# Patient Record
Sex: Male | Born: 1975 | Race: Black or African American | Hispanic: No | Marital: Single | Smoking: Former smoker
Health system: Southern US, Community
[De-identification: ages and names within clinical notes are randomized; demographics above are authoritative.]

## PROBLEM LIST (undated history)

## (undated) DIAGNOSIS — J45909 Unspecified asthma, uncomplicated: Secondary | ICD-10-CM

## (undated) DIAGNOSIS — Z21 Asymptomatic human immunodeficiency virus [HIV] infection status: Secondary | ICD-10-CM

## (undated) DIAGNOSIS — I10 Essential (primary) hypertension: Secondary | ICD-10-CM

## (undated) DIAGNOSIS — J449 Chronic obstructive pulmonary disease, unspecified: Secondary | ICD-10-CM

## (undated) DIAGNOSIS — E119 Type 2 diabetes mellitus without complications: Secondary | ICD-10-CM

## (undated) DIAGNOSIS — R569 Unspecified convulsions: Secondary | ICD-10-CM

---

## 2003-12-06 ENCOUNTER — Other Ambulatory Visit: Payer: Self-pay

## 2004-05-27 ENCOUNTER — Emergency Department: Payer: Self-pay | Admitting: Emergency Medicine

## 2004-06-21 ENCOUNTER — Emergency Department: Payer: Self-pay | Admitting: Unknown Physician Specialty

## 2006-07-01 ENCOUNTER — Other Ambulatory Visit: Payer: Self-pay

## 2006-07-01 ENCOUNTER — Inpatient Hospital Stay: Payer: Self-pay | Admitting: Internal Medicine

## 2006-09-07 ENCOUNTER — Emergency Department: Payer: Self-pay | Admitting: Emergency Medicine

## 2006-11-21 ENCOUNTER — Other Ambulatory Visit: Payer: Self-pay

## 2006-11-21 ENCOUNTER — Inpatient Hospital Stay: Payer: Self-pay | Admitting: *Deleted

## 2007-03-28 ENCOUNTER — Emergency Department: Payer: Self-pay | Admitting: Unknown Physician Specialty

## 2007-07-19 ENCOUNTER — Emergency Department: Payer: Self-pay | Admitting: Emergency Medicine

## 2007-09-06 ENCOUNTER — Other Ambulatory Visit: Payer: Self-pay

## 2007-09-06 ENCOUNTER — Emergency Department: Payer: Self-pay | Admitting: Emergency Medicine

## 2008-04-16 ENCOUNTER — Other Ambulatory Visit: Payer: Self-pay

## 2008-04-16 ENCOUNTER — Emergency Department: Payer: Self-pay | Admitting: Internal Medicine

## 2009-07-12 ENCOUNTER — Emergency Department: Payer: Self-pay | Admitting: Emergency Medicine

## 2015-11-14 ENCOUNTER — Emergency Department
Admission: EM | Admit: 2015-11-14 | Discharge: 2015-11-14 | Disposition: A | Discharge status: Home / Self Care | Payer: Self-pay | Attending: Emergency Medicine | Admitting: Emergency Medicine

## 2015-11-14 ENCOUNTER — Encounter: Payer: Self-pay | Admitting: Emergency Medicine

## 2015-11-14 ENCOUNTER — Emergency Department: Payer: Self-pay

## 2015-11-14 DIAGNOSIS — I10 Essential (primary) hypertension: Secondary | ICD-10-CM | POA: Insufficient documentation

## 2015-11-14 DIAGNOSIS — F17200 Nicotine dependence, unspecified, uncomplicated: Secondary | ICD-10-CM | POA: Insufficient documentation

## 2015-11-14 DIAGNOSIS — E119 Type 2 diabetes mellitus without complications: Secondary | ICD-10-CM | POA: Insufficient documentation

## 2015-11-14 DIAGNOSIS — M5442 Lumbago with sciatica, left side: Secondary | ICD-10-CM | POA: Insufficient documentation

## 2015-11-14 HISTORY — DX: Type 2 diabetes mellitus without complications: E11.9

## 2015-11-14 HISTORY — DX: Essential (primary) hypertension: I10

## 2015-11-14 MED ORDER — KETOROLAC TROMETHAMINE 60 MG/2ML IM SOLN
60.0000 mg | Freq: Once | INTRAMUSCULAR | Status: AC
  Administered 2015-11-14: 60 mg via INTRAMUSCULAR
  Filled 2015-11-14: qty 2

## 2015-11-14 MED ORDER — ETODOLAC 200 MG PO CAPS: 200.0000 mg | ORAL_CAPSULE | Freq: Three times a day (TID) | ORAL | Status: DC

## 2015-11-14 MED ORDER — LIDOCAINE 5 % EX PTCH: 1.0000 | MEDICATED_PATCH | Freq: Two times a day (BID) | CUTANEOUS | Status: DC

## 2015-11-14 MED ORDER — CYCLOBENZAPRINE HCL 10 MG PO TABS: 10.0000 mg | ORAL_TABLET | Freq: Three times a day (TID) | ORAL | Status: DC | PRN

## 2015-11-14 MED ORDER — LIDOCAINE 5 % EX PTCH
1.0000 | MEDICATED_PATCH | CUTANEOUS | Status: DC
  Administered 2015-11-14: 1 via TRANSDERMAL
  Filled 2015-11-14 (×2): qty 1

## 2015-11-14 NOTE — ED Provider Notes (Signed)
[facility] Emergency Department Provider Note  ____________________________________________  Time seen: Approximately 347 AM  I have reviewed the triage vital signs and the nursing notes.   HISTORY  Chief Complaint Back Pain    HPI Aaron [name] is a 40 y.o. male who comes into the hospital today with some back pain. The patient reports he has pain to the left side of his back and is going down his leg. He also reports that the pain as going across his back. The patient reports that the pain started around March 10. He has been taking aspirin but he reports it is not helping fully.The patient reports that he was with his boss and his boss how much pain he was in so he told him that he should come and get checked out. The patient denies having similar pain to this in the past. He denies having a primary care physician. The patient has not had any trauma or any lifting or any bending. The patient reports his pain is 8 out of 10 in intensity. He reports that he also has some pain when he walks. The patient is here for evaluation of his back pain. As a sharp aching and stabbing in his left lower back.   Past Medical History  Diagnosis Date  . Hypertension   . Diabetes mellitus without complication (HCC)     There are no active problems to display for this patient.   History reviewed. No pertinent past surgical history.  Current Outpatient Rx  Name  Route  Sig  Dispense  Refill  . cyclobenzaprine (FLEXERIL) 10 MG tablet   Oral   Take 1 tablet (10 mg total) by mouth every 8 (eight) hours as needed for muscle spasms.   15 tablet   0   . etodolac (LODINE) 200 MG capsule   Oral   Take 1 capsule (200 mg total) by mouth every 8 (eight) hours.   12 capsule   0   . lidocaine (LIDODERM) 5 %   Transdermal   Place 1 patch onto the skin every 12 (twelve) hours. Remove & Discard patch within 12 hours or as directed by MD   10 patch   0      Allergies Levaquin  No family history on file.  Social History Social History  Substance Use Topics  . Smoking status: Current Every Day Smoker  . Smokeless tobacco: None  . Alcohol Use: Yes    Review of Systems Constitutional: No fever/chills Eyes: No visual changes. ENT: No sore throat. Cardiovascular: Denies chest pain. Respiratory: Denies shortness of breath. Gastrointestinal: No abdominal pain.  No nausea, no vomiting.  No diarrhea.  No constipation. Genitourinary: Negative for dysuria. Musculoskeletal:  back pain. Skin: Negative for rash. Neurological: Negative for headaches, focal weakness or numbness.  10-point ROS otherwise negative.  ____________________________________________   PHYSICAL EXAM:  VITAL SIGNS: ED Triage Vitals  Enc Vitals Group     BP 11/14/15 0031 144/88 mmHg     Pulse Rate 11/14/15 0031 86     Resp 11/14/15 0031 20     Temp 11/14/15 0031 97.8 F (36.6 C)     Temp Source 11/14/15 0031 Oral     SpO2 11/14/15 0031 96 %     Weight 11/14/15 0031 280 lb (127.007 kg)     Height 11/14/15 0031 6' (1.829 m)     Head Cir --      Peak Flow --      Pain Score  11/14/15 0031 10     Pain Loc --      Pain Edu? --      Excl. in GC? --     Constitutional: Sleeping but arousable and oriented. Well appearing and in mild distress. Eyes: Conjunctivae are normal. PERRL. EOMI. Head: Atraumatic. Nose: No congestion/rhinnorhea. Mouth/Throat: Mucous membranes are moist.  Oropharynx non-erythematous. Cardiovascular: Normal rate, regular rhythm. Grossly normal heart sounds.  Good peripheral circulation. Respiratory: Normal respiratory effort.  No retractions. Lungs CTAB. Gastrointestinal: Soft and nontender. No distention. Positive bowel sounds Musculoskeletal: No lower extremity tenderness nor edema.  Left-sided back tenderness to palpation, positive straight leg raise Neurologic:  Normal speech and language.  Skin:  Skin is warm, dry and intact.   Psychiatric: Mood and affect are normal.   ____________________________________________   LABS (all labs ordered are listed, but only abnormal results are displayed)  Labs Reviewed - No data to display ____________________________________________  EKG  none ____________________________________________  RADIOLOGY  Lumbar spine x-ray: Mild curvature. ____________________________________________   PROCEDURES  Procedure(s) performed: None  Critical Care performed: No  ____________________________________________   INITIAL IMPRESSION / ASSESSMENT AND PLAN / ED COURSE  Pertinent labs & imaging results that were available during my care of the patient were reviewed by me and considered in my medical decision making (see chart for details).  This is a 40 year old male who comes into the hospital today with some low back pain. He also has some pain that goes down his leg. It appears as though the patient has some sciatica. I didn't x-ray to evaluate for degenerative changes and it was negative. I did give the patient a dose of Toradol as well as a Lidoderm patch. The patient has no other complaints. He was asleep on the stretcher when I walked into the room to evaluate him as well as during the evaluation he falls asleep easily and does not appear to be in any discomfort. ____________________________________________   FINAL CLINICAL IMPRESSION(S) / ED DIAGNOSES  Final diagnoses:  Left-sided low back pain with left-sided sciatica      Rebecka ApleyAllison P Mychelle Kendra, MD 11/14/15 [phone]

## 2015-11-14 NOTE — Discharge Instructions (Signed)

## 2015-11-14 NOTE — ED Notes (Addendum)
Pt ambulatory to triage with no difficulty. Pt reports pain to his left lower back radiating down his left leg. Pt reports started on 10/16/15 and went away and came back several times since. Pt states he only came in because his boss made him come get checked out. Pt denies injury. Pt states took 4 tylenol 500mg  at 8 pm for the pain. Pt laughing and relaxed during triage.

## 2016-02-22 ENCOUNTER — Emergency Department
Admission: EM | Admit: 2016-02-22 | Discharge: 2016-02-22 | Disposition: A | Discharge status: Home / Self Care | Payer: Self-pay | Attending: Emergency Medicine | Admitting: Emergency Medicine

## 2016-02-22 DIAGNOSIS — Z791 Long term (current) use of non-steroidal anti-inflammatories (NSAID): Secondary | ICD-10-CM | POA: Insufficient documentation

## 2016-02-22 DIAGNOSIS — I10 Essential (primary) hypertension: Secondary | ICD-10-CM | POA: Insufficient documentation

## 2016-02-22 DIAGNOSIS — M10072 Idiopathic gout, left ankle and foot: Secondary | ICD-10-CM | POA: Insufficient documentation

## 2016-02-22 DIAGNOSIS — E119 Type 2 diabetes mellitus without complications: Secondary | ICD-10-CM | POA: Insufficient documentation

## 2016-02-22 DIAGNOSIS — E876 Hypokalemia: Secondary | ICD-10-CM | POA: Insufficient documentation

## 2016-02-22 DIAGNOSIS — L301 Dyshidrosis [pompholyx]: Secondary | ICD-10-CM | POA: Insufficient documentation

## 2016-02-22 DIAGNOSIS — F17200 Nicotine dependence, unspecified, uncomplicated: Secondary | ICD-10-CM | POA: Insufficient documentation

## 2016-02-22 DIAGNOSIS — Z79899 Other long term (current) drug therapy: Secondary | ICD-10-CM | POA: Insufficient documentation

## 2016-02-22 LAB — CBC WITH DIFFERENTIAL/PLATELET
BASOS ABS: 0.1 10*3/uL (ref 0–0.1)
BASOS PCT: 1 %
Eosinophils Absolute: 0.1 10*3/uL (ref 0–0.7)
Eosinophils Relative: 1 %
HEMATOCRIT: 45 % (ref 40.0–52.0)
Hemoglobin: 15.5 g/dL (ref 13.0–18.0)
LYMPHS PCT: 19 %
Lymphs Abs: 2 10*3/uL (ref 1.0–3.6)
MCH: 30.2 pg (ref 26.0–34.0)
MCHC: 34.4 g/dL (ref 32.0–36.0)
MCV: 87.8 fL (ref 80.0–100.0)
MONO ABS: 1.4 10*3/uL — AB (ref 0.2–1.0)
Monocytes Relative: 13 %
NEUTROS ABS: 7.1 10*3/uL — AB (ref 1.4–6.5)
Neutrophils Relative %: 66 %
PLATELETS: 239 10*3/uL (ref 150–440)
RBC: 5.13 MIL/uL (ref 4.40–5.90)
RDW: 15.8 % — AB (ref 11.5–14.5)
WBC: 10.6 10*3/uL (ref 3.8–10.6)

## 2016-02-22 LAB — COMPREHENSIVE METABOLIC PANEL
ALK PHOS: 38 U/L (ref 38–126)
ALT: 25 U/L (ref 17–63)
ANION GAP: 6 (ref 5–15)
AST: 21 U/L (ref 15–41)
Albumin: 3.9 g/dL (ref 3.5–5.0)
BILIRUBIN TOTAL: 0.6 mg/dL (ref 0.3–1.2)
BUN: 14 mg/dL (ref 6–20)
CALCIUM: 9.1 mg/dL (ref 8.9–10.3)
CO2: 28 mmol/L (ref 22–32)
Chloride: 104 mmol/L (ref 101–111)
Creatinine, Ser: 1.37 mg/dL — ABNORMAL HIGH (ref 0.61–1.24)
GLUCOSE: 82 mg/dL (ref 65–99)
POTASSIUM: 3.2 mmol/L — AB (ref 3.5–5.1)
Sodium: 138 mmol/L (ref 135–145)
TOTAL PROTEIN: 7.7 g/dL (ref 6.5–8.1)

## 2016-02-22 LAB — SEDIMENTATION RATE: SED RATE: 16 mm/h — AB (ref 0–15)

## 2016-02-22 LAB — URIC ACID: Uric Acid, Serum: 10.3 mg/dL — ABNORMAL HIGH (ref 4.4–7.6)

## 2016-02-22 MED ORDER — HYDROCORTISONE VALERATE 0.2 % EX OINT
TOPICAL_OINTMENT | CUTANEOUS | Status: AC
Start: 2016-02-22 — End: 2017-02-21

## 2016-02-22 MED ORDER — COLCHICINE 0.6 MG PO TABS: 0.6000 mg | ORAL_TABLET | Freq: Two times a day (BID) | ORAL | Status: DC

## 2016-02-22 MED ORDER — NAPROXEN 500 MG PO TABS: 500.0000 mg | ORAL_TABLET | Freq: Two times a day (BID) | ORAL | Status: DC

## 2016-02-22 MED ORDER — POTASSIUM CHLORIDE ER 10 MEQ PO TBCR: 10.0000 meq | EXTENDED_RELEASE_TABLET | Freq: Every day | ORAL | Status: DC

## 2016-02-22 MED ORDER — DEXAMETHASONE SODIUM PHOSPHATE 10 MG/ML IJ SOLN
10.0000 mg | Freq: Once | INTRAMUSCULAR | Status: AC
  Administered 2016-02-22: 10 mg via INTRAMUSCULAR
  Filled 2016-02-22: qty 1

## 2016-02-22 MED ORDER — HYDROXYZINE HCL 50 MG PO TABS
50.0000 mg | ORAL_TABLET | Freq: Once | ORAL | Status: AC
  Administered 2016-02-22: 50 mg via ORAL
  Filled 2016-02-22: qty 1

## 2016-02-22 NOTE — ED Notes (Signed)
See triage note. States he developed blisters to both hands a few days ago

## 2016-02-22 NOTE — ED Provider Notes (Signed)
[facility] Emergency Department Provider Note   ____________________________________________  Time seen: Approximately 10:36 AM  I have reviewed the triage vital signs and the nursing notes.   HISTORY  Chief Complaint Rash    HPI Aaron [name] is a 40 y.o. male patient complaining bilateral hand blisters 3-5 days.. Patient has a history of eczema and states he has intermittent flareups of this episode. Patient is placed calamine lotion to the area with no relief.Patient also complaining of edema and pain to the right foot. Paced the denies any provocative incident for his pain. Patient has been using over-the-counter pain patches with no nose relief. Patient states  borderline diabetic but has not has not prescribed any medications. Patient rates his overall pain as a 10 over 10.  Past Medical History  Diagnosis Date  . Hypertension   . Diabetes mellitus without complication (HCC)     There are no active problems to display for this patient.   History reviewed. No pertinent past surgical history.  Current Outpatient Rx  Name  Route  Sig  Dispense  Refill  . colchicine 0.6 MG tablet   Oral   Take 1 tablet (0.6 mg total) by mouth 2 (two) times daily.   30 tablet   2   . cyclobenzaprine (FLEXERIL) 10 MG tablet   Oral   Take 1 tablet (10 mg total) by mouth every 8 (eight) hours as needed for muscle spasms.   15 tablet   0   . etodolac (LODINE) 200 MG capsule   Oral   Take 1 capsule (200 mg total) by mouth every 8 (eight) hours.   12 capsule   0   . hydrocortisone valerate ointment (WESTCORT) 0.2 %      Apply to affected area daily   45 g   1   . lidocaine (LIDODERM) 5 %   Transdermal   Place 1 patch onto the skin every 12 (twelve) hours. Remove & Discard patch within 12 hours or as directed by MD   10 patch   0   . naproxen (NAPROSYN) 500 MG tablet   Oral   Take 1 tablet (500 mg total) by mouth 2 (two) times daily with a  meal.   20 tablet   0   . potassium chloride (K-DUR) 10 MEQ tablet   Oral   Take 1 tablet (10 mEq total) by mouth daily.   30 tablet   0     Allergies Levaquin  No family history on file.  Social History Social History  Substance Use Topics  . Smoking status: Current Every Day Smoker  . Smokeless tobacco: None  . Alcohol Use: Yes    Review of Systems Constitutional: No fever/chills Eyes: No visual changes. ENT: No sore throat. Cardiovascular: Denies chest pain. Respiratory: Denies shortness of breath. Gastrointestinal: No abdominal pain.  No nausea, no vomiting.  No diarrhea.  No constipation. Genitourinary: Negative for dysuria. Musculoskeletal: Negative for back pain. Skin: Negative for rash. Neurological: Negative for headaches, focal weakness or numbness. {**Psychiatric: Endocrine: Hematological/Lymphatic: Allergic/Immunilogical: Levaquin ____________________________________________   PHYSICAL EXAM:  VITAL SIGNS: ED Triage Vitals  Enc Vitals Group     BP 02/22/16 0958 147/111 mmHg     Pulse Rate 02/22/16 0958 94     Resp 02/22/16 0958 16     Temp 02/22/16 0958 98.1 F (36.7 C)     Temp Source 02/22/16 0958 Oral     SpO2 02/22/16 0958 99 %  Weight 02/22/16 0958 278 lb (126.1 kg)     Height 02/22/16 0958 6' (1.829 m)     Head Cir --      Peak Flow --      Pain Score 02/22/16 1001 10     Pain Loc --      Pain Edu? --      Excl. in GC? --     Constitutional: Alert and oriented. Well appearing and in no acute distress. Eyes: Conjunctivae are normal. PERRL. EOMI. Head: Atraumatic. Nose: No congestion/rhinnorhea. Mouth/Throat: Mucous membranes are moist.  Oropharynx non-erythematous. Neck: No stridor. No cervical spine tenderness to palpation. Hematological/Lymphatic/Immunilogical: No cervical lymphadenopathy. Cardiovascular: Normal rate, regular rhythm. Grossly normal heart sounds.  Good peripheral circulation. Respiratory: Normal  respiratory effort.  No retractions. Lungs CTAB. Gastrointestinal: Soft and nontender. No distention. No abdominal bruits. No CVA tenderness. Musculoskeletal: No lower extremity tenderness nor edema.  No joint effusions. Neurologic:  Normal speech and language. No gross focal neurologic deficits are appreciated. No gait instability. Skin:  Skin is warm, dry and intact. Bilateral hand rash Psychiatric: Mood and affect are normal. Speech and behavior are normal.  ____________________________________________   LABS (all labs ordered are listed, but only abnormal results are displayed)  Labs Reviewed  COMPREHENSIVE METABOLIC PANEL - Abnormal; Notable for the following:    Potassium 3.2 (*)    Creatinine, Ser 1.37 (*)    All other components within normal limits  CBC WITH DIFFERENTIAL/PLATELET - Abnormal; Notable for the following:    RDW 15.8 (*)    Neutro Abs 7.1 (*)    Monocytes Absolute 1.4 (*)    All other components within normal limits  URIC ACID - Abnormal; Notable for the following:    Uric Acid, Serum 10.3 (*)    All other components within normal limits  SEDIMENTATION RATE - Abnormal; Notable for the following:    Sed Rate 16 (*)    All other components within normal limits   ____________________________________________  EKG   ____________________________________________  RADIOLOGY   ____________________________________________   PROCEDURES  Procedure(s) performed: None  Procedures  Critical Care performed: No  ____________________________________________   INITIAL IMPRESSION / ASSESSMENT AND PLAN / ED COURSE  Pertinent labs & imaging results that were available during my care of the patient were reviewed by me and considered in my medical decision making (see chart for details).  Gout right foot. Hypokalemia. Dyshidrotic eczema bilateral hand.. Patient given discharge care instructions. Patient given a prescription for Colchicine, K-Dur, naproxen and  tramadol. Patient advised to [facility]. ____________________________________________   FINAL CLINICAL IMPRESSION(S) / ED DIAGNOSES  Final diagnoses:  Acute idiopathic gout involving toe of left foot  Hypokalemia  Eczema, dyshidrotic      NEW MEDICATIONS STARTED DURING THIS VISIT:  New Prescriptions   COLCHICINE 0.6 MG TABLET    Take 1 tablet (0.6 mg total) by mouth 2 (two) times daily.   HYDROCORTISONE VALERATE OINTMENT (WESTCORT) 0.2 %    Apply to affected area daily   NAPROXEN (NAPROSYN) 500 MG TABLET    Take 1 tablet (500 mg total) by mouth 2 (two) times daily with a meal.   POTASSIUM CHLORIDE (K-DUR) 10 MEQ TABLET    Take 1 tablet (10 mEq total) by mouth daily.     Note:  This document was prepared using Dragon voice recognition software and may include unintentional dictation errors.    Joni Reining, PA-C 02/22/16 1217  Molly Maduro  Cyril LoosenKinner, MD 02/22/16 1325

## 2016-02-22 NOTE — Discharge Instructions (Signed)
Gout Gout is when your joints become red, sore, and swell (inflamed). This is caused by the buildup of uric acid crystals in the joints. Uric acid is a chemical that is normally in the blood. If the level of uric acid gets too high in the blood, these crystals form in your joints and tissues. Over time, these crystals can form into masses near the joints and tissues. These masses can destroy bone and cause the bone to look misshapen (deformed). HOME CARE   Do not take aspirin for pain.  Only take medicine as told by your doctor.  Rest the joint as much as you can. When in bed, keep sheets and blankets off painful areas.  Keep the sore joints raised (elevated).  Put warm or cold packs on painful joints. Use of warm or cold packs depends on which works best for you.  Use crutches if the painful joint is in your leg.  Drink enough fluids to keep your pee (urine) clear or pale yellow. Limit alcohol, sugary drinks, and drinks with fructose in them.  Follow your diet instructions. Pay careful attention to how much protein you eat. Include fruits, vegetables, whole grains, and fat-free or low-fat milk products in your daily diet. Talk to your doctor or dietitian about the use of coffee, vitamin C, and cherries. These may help lower uric acid levels.  Keep a healthy body weight. GET HELP RIGHT AWAY IF:   You have watery poop (diarrhea), throw up (vomit), or have any side effects from medicines.  You do not feel better in 24 hours, or you are getting worse.  Your joint becomes suddenly more tender, and you have chills or a fever. MAKE SURE YOU:   Understand these instructions.  Will watch your condition.  Will get help right away if you are not doing well or get worse.   This information is not intended to replace advice given to you by your health care provider. Make sure you discuss any questions you have with your health care provider.   Document Released: 05/03/2008 Document Revised:  08/15/2014 Document Reviewed: 03/07/2012 Elsevier Interactive Patient Education 2016 Elsevier Inc.  Hand Dermatitis Hand dermatitis is a skin problem. Small, itchy, raised dots or blisters appear on the palms of the hands. Hand dermatitis can last 3 to 4 weeks. HOME CARE  Avoid washing your hands too much.  Avoid all harsh chemicals. Wear gloves when you use products that can bother your skin.  Use medicated cream (1% hydrocortisone cream) at least 2 to 4 times per day.  Only take medicine as told by your doctor.  You may use wet cloths (compresses) or cold packs. GET HELP RIGHT AWAY IF:   The rash is not better after 1 week of treatment.  The area is red, tender, or yellowish-white fluid (pus) comes from the wound.  The rash is spreading. MAKE SURE YOU:   Understand these instructions.  Will watch your condition.  Will get help right away if you are not doing well or get worse.   This information is not intended to replace advice given to you by your health care provider. Make sure you discuss any questions you have with your health care provider.   Document Released: 10/19/2009 Document Revised: 10/17/2011 Document Reviewed: 02/06/2015 Elsevier Interactive Patient Education 2016 [employer].  Hypokalemia Hypokalemia means that the amount of potassium in the blood is lower than normal.Potassium is a chemical, called an electrolyte, that helps regulate the amount of fluid in  the body. It also stimulates muscle contraction and helps nerves function properly.Most of the body's potassium is inside of cells, and only a very small amount is in the blood. Because the amount in the blood is so small, minor changes can be life-threatening. CAUSES  Antibiotics.  Diarrhea or vomiting.  Using laxatives too much, which can cause diarrhea.  Chronic kidney disease.  Water pills (diuretics).  Eating disorders (bulimia).  Low magnesium level.  Sweating a lot. SIGNS AND  SYMPTOMS  Weakness.  Constipation.  Fatigue.  Muscle cramps.  Mental confusion.  Skipped heartbeats or irregular heartbeat (palpitations).  Tingling or numbness. DIAGNOSIS  Your health care provider can diagnose hypokalemia with blood tests. In addition to checking your potassium level, your health care provider may also check other lab tests. TREATMENT Hypokalemia can be treated with potassium supplements taken by mouth or adjustments in your current medicines. If your potassium level is very low, you may need to get potassium through a vein (IV) and be monitored in the hospital. A diet high in potassium is also helpful. Foods high in potassium are:  Nuts, such as peanuts and pistachios.  Seeds, such as sunflower seeds and pumpkin seeds.  Peas, lentils, and lima beans.  Whole grain and bran cereals and breads.  Fresh fruit and vegetables, such as apricots, avocado, bananas, cantaloupe, kiwi, oranges, tomatoes, asparagus, and potatoes.  Orange and tomato juices.  Red meats.  Fruit yogurt. HOME CARE INSTRUCTIONS  Take all medicines as prescribed by your health care provider.  Maintain a healthy diet by including nutritious food, such as fruits, vegetables, nuts, whole grains, and lean meats.  If you are taking a laxative, be sure to follow the directions on the label. SEEK MEDICAL CARE IF:  Your weakness gets worse.  You feel your heart pounding or racing.  You are vomiting or having diarrhea.  You are diabetic and having trouble keeping your blood glucose in the normal range. SEEK IMMEDIATE MEDICAL CARE IF:  You have chest pain, shortness of breath, or dizziness.  You are vomiting or having diarrhea for more than 2 days.  You faint. MAKE SURE YOU:    Understand these instructions.  Will watch your condition.  Will get help right away if you are not doing well or get worse.   This information is not intended to replace advice given to you by your  health care provider. Make sure you discuss any questions you have with your health care provider.   Document Released: 07/25/2005 Document Revised: 08/15/2014 Document Reviewed: 01/25/2013 Elsevier Interactive Patient Education [employer] Elsevier Inc.

## 2016-02-22 NOTE — ED Notes (Signed)
Pt c/o blisters on BL hands for the past week, pt also c/o right foot pain without injury

## 2016-04-20 ENCOUNTER — Emergency Department
Admission: EM | Admit: 2016-04-20 | Discharge: 2016-04-20 | Disposition: A | Discharge status: Home / Self Care | Payer: Self-pay | Attending: Emergency Medicine | Admitting: Emergency Medicine

## 2016-04-20 DIAGNOSIS — X58XXXA Exposure to other specified factors, initial encounter: Secondary | ICD-10-CM | POA: Insufficient documentation

## 2016-04-20 DIAGNOSIS — F17200 Nicotine dependence, unspecified, uncomplicated: Secondary | ICD-10-CM | POA: Insufficient documentation

## 2016-04-20 DIAGNOSIS — T783XXA Angioneurotic edema, initial encounter: Secondary | ICD-10-CM | POA: Diagnosis present

## 2016-04-20 DIAGNOSIS — Z794 Long term (current) use of insulin: Secondary | ICD-10-CM | POA: Insufficient documentation

## 2016-04-20 DIAGNOSIS — I10 Essential (primary) hypertension: Secondary | ICD-10-CM | POA: Insufficient documentation

## 2016-04-20 DIAGNOSIS — Z79899 Other long term (current) drug therapy: Secondary | ICD-10-CM | POA: Insufficient documentation

## 2016-04-20 DIAGNOSIS — L309 Dermatitis, unspecified: Secondary | ICD-10-CM | POA: Insufficient documentation

## 2016-04-20 DIAGNOSIS — E119 Type 2 diabetes mellitus without complications: Secondary | ICD-10-CM | POA: Insufficient documentation

## 2016-04-20 DIAGNOSIS — Z888 Allergy status to other drugs, medicaments and biological substances status: Secondary | ICD-10-CM | POA: Insufficient documentation

## 2016-04-20 LAB — CBC WITH DIFFERENTIAL/PLATELET
BASOS ABS: 0.1 10*3/uL (ref 0–0.1)
Basophils Relative: 1 %
Eosinophils Absolute: 0 10*3/uL (ref 0–0.7)
Eosinophils Relative: 0 %
HEMATOCRIT: 48.6 % (ref 40.0–52.0)
HEMOGLOBIN: 16.5 g/dL (ref 13.0–18.0)
LYMPHS PCT: 10 %
Lymphs Abs: 1 10*3/uL (ref 1.0–3.6)
MCH: 30 pg (ref 26.0–34.0)
MCHC: 34 g/dL (ref 32.0–36.0)
MCV: 88.2 fL (ref 80.0–100.0)
MONO ABS: 0.4 10*3/uL (ref 0.2–1.0)
MONOS PCT: 4 %
NEUTROS ABS: 8 10*3/uL — AB (ref 1.4–6.5)
NEUTROS PCT: 85 %
Platelets: 236 10*3/uL (ref 150–440)
RBC: 5.51 MIL/uL (ref 4.40–5.90)
RDW: 15 % — AB (ref 11.5–14.5)
WBC: 9.5 10*3/uL (ref 3.8–10.6)

## 2016-04-20 LAB — BASIC METABOLIC PANEL
ANION GAP: 9 (ref 5–15)
BUN: 11 mg/dL (ref 6–20)
CALCIUM: 9.4 mg/dL (ref 8.9–10.3)
CHLORIDE: 103 mmol/L (ref 101–111)
CO2: 25 mmol/L (ref 22–32)
Creatinine, Ser: 1.36 mg/dL — ABNORMAL HIGH (ref 0.61–1.24)
GFR calc Af Amer: >60 mL/min (ref >60)
GFR calc non Af Amer: >60 mL/min (ref >60)
GLUCOSE: 106 mg/dL — AB (ref 65–99)
Potassium: 3.8 mmol/L (ref 3.5–5.1)
Sodium: 137 mmol/L (ref 135–145)

## 2016-04-20 MED ORDER — DIPHENHYDRAMINE HCL 25 MG PO CAPS: 25.0000 mg | ORAL_CAPSULE | Freq: Four times a day (QID) | ORAL | Status: DC | PRN

## 2016-04-20 MED ORDER — AMLODIPINE BESYLATE 5 MG PO TABS: 10.0000 mg | ORAL_TABLET | Freq: Every day | ORAL | Status: DC

## 2016-04-20 MED ORDER — EPINEPHRINE 0.3 MG/0.3ML IJ SOAJ: 0.3000 mg | Freq: Once | INTRAMUSCULAR | 0 refills | Status: AC

## 2016-04-20 MED ORDER — ONDANSETRON HCL 4 MG PO TABS: 4.0000 mg | ORAL_TABLET | Freq: Four times a day (QID) | ORAL | Status: DC | PRN

## 2016-04-20 MED ORDER — ALBUTEROL SULFATE (2.5 MG/3ML) 0.083% IN NEBU: 2.5000 mg | INHALATION_SOLUTION | RESPIRATORY_TRACT | Status: DC | PRN

## 2016-04-20 MED ORDER — PREDNISONE 20 MG PO TABS
60.0000 mg | ORAL_TABLET | Freq: Once | ORAL | Status: AC
  Administered 2016-04-20: 60 mg via ORAL

## 2016-04-20 MED ORDER — ENOXAPARIN SODIUM 40 MG/0.4ML ~~LOC~~ SOLN: 40.0000 mg | SUBCUTANEOUS | Status: DC

## 2016-04-20 MED ORDER — FAMOTIDINE 20 MG PO TABS: 20.0000 mg | ORAL_TABLET | Freq: Two times a day (BID) | ORAL | Status: DC

## 2016-04-20 MED ORDER — DIPHENHYDRAMINE HCL 25 MG PO CAPS
ORAL_CAPSULE | ORAL | Status: AC
  Administered 2016-04-20: 25 mg via ORAL
  Filled 2016-04-20: qty 1

## 2016-04-20 MED ORDER — SODIUM CHLORIDE 0.9% FLUSH: 3.0000 mL | INTRAVENOUS | Status: DC | PRN

## 2016-04-20 MED ORDER — POLYETHYLENE GLYCOL 3350 17 G PO PACK: 17.0000 g | PACK | Freq: Every day | ORAL | Status: DC | PRN

## 2016-04-20 MED ORDER — SODIUM CHLORIDE 0.9 % IV SOLN: 250.0000 mL | INTRAVENOUS | Status: DC | PRN

## 2016-04-20 MED ORDER — ACETAMINOPHEN 650 MG RE SUPP
650.0000 mg | Freq: Four times a day (QID) | RECTAL | Status: DC | PRN
  Filled 2016-04-20: qty 1

## 2016-04-20 MED ORDER — DIPHENHYDRAMINE HCL 25 MG PO CAPS
25.0000 mg | ORAL_CAPSULE | Freq: Once | ORAL | Status: AC
  Administered 2016-04-20: 25 mg via ORAL

## 2016-04-20 MED ORDER — HYDRALAZINE HCL 20 MG/ML IJ SOLN: 10.0000 mg | Freq: Four times a day (QID) | INTRAMUSCULAR | Status: DC | PRN

## 2016-04-20 MED ORDER — INSULIN ASPART 100 UNIT/ML ~~LOC~~ SOLN: 0.0000 [IU] | Freq: Three times a day (TID) | SUBCUTANEOUS | Status: DC

## 2016-04-20 MED ORDER — ONDANSETRON HCL 4 MG/2ML IJ SOLN: 4.0000 mg | Freq: Four times a day (QID) | INTRAMUSCULAR | Status: DC | PRN

## 2016-04-20 MED ORDER — SODIUM CHLORIDE 0.9% FLUSH: 3.0000 mL | Freq: Two times a day (BID) | INTRAVENOUS | Status: DC

## 2016-04-20 MED ORDER — PREDNISONE 20 MG PO TABS: 60.0000 mg | ORAL_TABLET | Freq: Every day | ORAL | 0 refills | Status: DC

## 2016-04-20 MED ORDER — ACETAMINOPHEN 325 MG PO TABS: 650.0000 mg | ORAL_TABLET | Freq: Four times a day (QID) | ORAL | Status: DC | PRN

## 2016-04-20 MED ORDER — PREDNISONE 20 MG PO TABS
ORAL_TABLET | ORAL | Status: AC
  Administered 2016-04-20: 60 mg via ORAL
  Filled 2016-04-20: qty 3

## 2016-04-20 MED ORDER — PREDNISONE 20 MG PO TABS: 50.0000 mg | ORAL_TABLET | Freq: Every day | ORAL | Status: DC

## 2016-04-20 NOTE — ED Notes (Signed)
Angioedema to upper lip, pt states it began yesterday, denies any new medication, states he has been taking benadryl at home, pt speaking in full clear sentances

## 2016-04-20 NOTE — ED Provider Notes (Addendum)
[facility] Emergency Department Provider Note  ____________________________________________   First MD Initiated Contact with Patient 04/20/16 1405     (approximate)  I have reviewed the triage vital signs and the nursing notes.   HISTORY  Chief Complaint Oral Swelling   HPI Aaron [name] is a 40 y.o. male with a history of diabetes and hypertension denies taking any medications at this time was present emergency Department with 1 day of worsening upper lip swelling. He says that he is now having a scratchy feeling in the back of his throat. He says that his voice is normal and he is controlling his secretions. However, he does feel swelling to the tissue under the back of his jaw. He says that it also feels "itchy" when he swallows. He denies taking any medications at this time. Denies any foods, detergents or soaps. Says that he had 3 tabs of prednisone as well as Benadryl in the lobby and his symptoms seem to be worsening.    Past Medical History:  Diagnosis Date  . Diabetes mellitus without complication (HCC)   . Hypertension     There are no active problems to display for this patient.   History reviewed. No pertinent surgical history.  Prior to Admission medications   Medication Sig Start Date End Date Taking? Authorizing Provider  colchicine 0.6 MG tablet Take 1 tablet (0.6 mg total) by mouth 2 (two) times daily. 02/22/16 02/21/17  Joni Reiningonald K Smith, PA-C  cyclobenzaprine (FLEXERIL) 10 MG tablet Take 1 tablet (10 mg total) by mouth every 8 (eight) hours as needed for muscle spasms. 11/14/15   Rebecka ApleyAllison P Webster, MD  etodolac (LODINE) 200 MG capsule Take 1 capsule (200 mg total) by mouth every 8 (eight) hours. 11/14/15   Rebecka ApleyAllison P Webster, MD  hydrocortisone valerate ointment (WESTCORT) 0.2 % Apply to affected area daily 02/22/16 02/21/17  Joni Reiningonald K Smith, PA-C  lidocaine (LIDODERM) 5 % Place 1 patch onto the skin every 12 (twelve) hours. Remove & Discard  patch within 12 hours or as directed by MD 11/14/15 11/13/16  Rebecka ApleyAllison P Webster, MD  naproxen (NAPROSYN) 500 MG tablet Take 1 tablet (500 mg total) by mouth 2 (two) times daily with a meal. 02/22/16   Joni Reiningonald K Smith, PA-C  potassium chloride (K-DUR) 10 MEQ tablet Take 1 tablet (10 mEq total) by mouth daily. 02/22/16   Joni Reiningonald K Smith, PA-C    Allergies Levaquin [levofloxacin]  No family history on file.  Social History Social History  Substance Use Topics  . Smoking status: Current Every Day Smoker  . Smokeless tobacco: Never Used  . Alcohol use Yes    Review of Systems onstitutional: No fever/chills Eyes: No visual changes. ENT: No sore throat. Cardiovascular: Denies chest pain. Respiratory: Denies shortness of breath. Gastrointestinal: No abdominal pain.  No nausea, no vomiting.   Genitourinary: Negative for dysuria. Musculoskeletal: Negative for back pain. Skin: Negative for rash. Neurological: Negative for headaches, focal weakness or numbness.  10-point ROS otherwise negative.  ____________________________________________   PHYSICAL EXAM:  VITAL SIGNS: ED Triage Vitals  Enc Vitals Group     BP 04/20/16 1157 (!) 168/117     Pulse Rate 04/20/16 1157 90     Resp 04/20/16 1157 18     Temp 04/20/16 1157 98.5 F (36.9 C)     Temp Source 04/20/16 1157 Oral     SpO2 04/20/16 1157 98 %     Weight 04/20/16 1159 215 lb (97.5 kg)  Height 04/20/16 1159 6' (1.829 m)     Head Circumference --      Peak Flow --      Pain Score 04/20/16 1159 7     Pain Loc --      Pain Edu? --      Excl. in GC? --     Constitutional: Alert and oriented. Well appearing and in no acute distress. Eyes: Conjunctivae are normal. PERRL. EOMI. Head: Atraumatic. Nose: No congestion/rhinnorhea. Mouth/Throat: Mucous membranes are moist.  Oropharynx non-erythematous.Mildly swollen uvula. Upper lip with mild to moderate edema which is tender to palpation. No pus. No induration. Controlling his  secretions. Speaks in a normal voice. Mild fullness with tenderness to palpation at the posterior, submandibular tissues. No erythema, induration or pus. Neck: No stridor.   Cardiovascular: Normal rate, regular rhythm. Grossly normal heart sounds.   Respiratory: Normal respiratory effort.  No retractions. Lungs CTAB. Gastrointestinal: Soft and nontender. No distention.  Musculoskeletal: No lower extremity tenderness nor edema.  No joint effusions. Neurologic:  Normal speech and language. No gross focal neurologic deficits are appreciated. No gait instability. Skin:  Skin is warm, dry and intact. No rash noted. Psychiatric: Mood and affect are normal. Speech and behavior are normal.  ____________________________________________   LABS (all labs ordered are listed, but only abnormal results are displayed)  Labs Reviewed  CBC WITH DIFFERENTIAL/PLATELET - Abnormal; Notable for the following:       Result Value   RDW 15.0 (*)    Neutro Abs 8.0 (*)    All other components within normal limits  BASIC METABOLIC PANEL   ____________________________________________  EKG   ____________________________________________  RADIOLOGY   ____________________________________________   PROCEDURES  Procedure(s) performed:   Procedures  Critical Care performed:   ____________________________________________   INITIAL IMPRESSION / ASSESSMENT AND PLAN / ED COURSE  Pertinent labs & imaging results that were available during my care of the patient were reviewed by me and considered in my medical decision making (see chart for details).  ----------------------------------------- 3:56 PM on 04/20/2016 -----------------------------------------  Patient still without distress. Discussed case Dr. Willeen Cass of the ear nose and throat. He'll be endoscoped the patient. Because the patient has worsening symptoms despite steroids will give him for airway observation. He is understanding the plan and  willing to comply. Patient also denies any NSAID use, including aspirin or Goody's powder.  Clinical Course     ____________________________________________   FINAL CLINICAL IMPRESSION(S) / ED DIAGNOSES  Angioedema    NEW MEDICATIONS STARTED DURING THIS VISIT:  New Prescriptions   No medications on file     Note:  This document was prepared using Dragon voice recognition software and may include unintentional dictation errors.    Myrna Blazer, MD 04/20/16 1558  Patient says he is now feeling better and his throat feels normal. Dr. Willeen Cass of ear nose and throat was at the bedside but decided not to scope him because of his clinical improvement and no uvular swelling at this time. I'll be discharging the patient home. He will follow-up in the office with the ear nose and throat doctor. I'll be discharging him with steroids as well as an EpiPen. Unclear cause of the patient's facial swelling. However, he is improving with steroids and has undergone a significant period of observation in the emergency department. He understands the plan to return to the emergency department immediately for any worsening or concerning symptoms. He'll be discharged home.    Myrna Blazer, MD 04/20/16 [phone]

## 2016-04-20 NOTE — H&P (Deleted)
[facility] [place] at [facility]   PATIENT NAME: Aaron SartoriusMatthew [name]    MR#:  [id number]  DATE OF BIRTH:  23-Apr-1976  DATE OF ADMISSION:  04/20/2016  PRIMARY CARE PHYSICIAN: No PCP Per Patient   REQUESTING/REFERRING PHYSICIAN: Dr. Raynelle CharySchavitz  CHIEF COMPLAINT:   Chief Complaint  Patient presents with  . Oral Swelling    HISTORY OF PRESENT ILLNESS:  Aaron [name]  is a 40 y.o. male with a known history of Hypertension, diabetes, eczema not on medications presents to the hospital complaining of upper lip swelling since yesterday. Patient was hoping this would get better but today due to worsening and they're noticing some change in his eyes and scratchiness in her throat presented to the emergency room. He has received prednisone and with no improvement ENT has been consulted. Patient is being brought in for observation overnight. He has no shortness of breath. Able to clear his saliva well. Afebrile.  PAST MEDICAL HISTORY:   Past Medical History:  Diagnosis Date  . Diabetes mellitus without complication (HCC)   . Hypertension     PAST SURGICAL HISTORY:  History reviewed. No pertinent surgical history.  SOCIAL HISTORY:   Social History  Substance Use Topics  . Smoking status: Current Every Day Smoker  . Smokeless tobacco: Never Used  . Alcohol use Yes    FAMILY HISTORY:  No family history on file.  DRUG ALLERGIES:   Allergies  Allergen Reactions  . Levaquin [Levofloxacin] Rash    REVIEW OF SYSTEMS:   Review of Systems  Constitutional: Negative for chills, fever and weight loss.  HENT: Positive for sore throat. Negative for hearing loss and nosebleeds.   Eyes: Negative for blurred vision, double vision and pain.  Respiratory: Negative for cough, hemoptysis, sputum production, shortness of breath and wheezing.   Cardiovascular: Negative for chest pain, palpitations, orthopnea and leg swelling.  Gastrointestinal: Negative for abdominal  pain, constipation, diarrhea, heartburn, nausea and vomiting.  Genitourinary: Negative for dysuria and hematuria.  Musculoskeletal: Negative for back pain, falls, joint pain and myalgias.  Skin: Negative for rash.  Neurological: Negative for dizziness, tremors, sensory change, speech change, focal weakness, seizures and headaches.  Endo/Heme/Allergies: Does not bruise/bleed easily.  Psychiatric/Behavioral: Negative for depression and memory loss. The patient is not nervous/anxious.     MEDICATIONS AT HOME:   Prior to Admission medications   Medication Sig Start Date End Date Taking? Authorizing Provider  colchicine 0.6 MG tablet Take 1 tablet (0.6 mg total) by mouth 2 (two) times daily. 02/22/16 02/21/17  Joni Reiningonald K Smith, PA-C  cyclobenzaprine (FLEXERIL) 10 MG tablet Take 1 tablet (10 mg total) by mouth every 8 (eight) hours as needed for muscle spasms. 11/14/15   Rebecka ApleyAllison P Webster, MD  etodolac (LODINE) 200 MG capsule Take 1 capsule (200 mg total) by mouth every 8 (eight) hours. 11/14/15   Rebecka ApleyAllison P Webster, MD  hydrocortisone valerate ointment (WESTCORT) 0.2 % Apply to affected area daily 02/22/16 02/21/17  Joni Reiningonald K Smith, PA-C  lidocaine (LIDODERM) 5 % Place 1 patch onto the skin every 12 (twelve) hours. Remove & Discard patch within 12 hours or as directed by MD 11/14/15 11/13/16  Rebecka ApleyAllison P Webster, MD  naproxen (NAPROSYN) 500 MG tablet Take 1 tablet (500 mg total) by mouth 2 (two) times daily with a meal. 02/22/16   Joni Reiningonald K Smith, PA-C  potassium chloride (K-DUR) 10 MEQ tablet Take 1 tablet (10 mEq total) by mouth daily. 02/22/16   Joni Reiningonald K Smith, PA-C  VITAL SIGNS:  Blood pressure (!) 168/117, pulse 70, temperature 98.5 F (36.9 C), temperature source Oral, resp. rate 18, height 6' (1.829 m), weight 97.5 kg (215 lb), SpO2 100 %.  PHYSICAL EXAMINATION:  Physical Exam  GENERAL:  40 y.o.-year-old patient lying in the bed with no acute distress.  EYES: Pupils equal, round, reactive to light and  accommodation. No scleral icterus. Extraocular muscles intact.  HEENT: Head atraumatic, normocephalic. Oropharynx and nasopharynx clear.  Swelling of upper lip and right side of the tongue. No oropharyngeal erythema seen. No masses. NECK:  Supple, no jugular venous distention. No thyroid enlargement, no tenderness.  LUNGS: Normal breath sounds bilaterally, no wheezing, rales, rhonchi. No use of accessory muscles of respiration.  CARDIOVASCULAR: S1, S2 normal. No murmurs, rubs, or gallops.  ABDOMEN: Soft, nontender, nondistended. Bowel sounds present. No organomegaly or mass.  EXTREMITIES: No pedal edema, cyanosis, or clubbing. + 2 pedal & radial pulses b/l.   NEUROLOGIC: Cranial nerves II through XII are intact. No focal Motor or sensory deficits appreciated b/l PSYCHIATRIC: The patient is alert and oriented x 3. Good affect.  SKIN: No obvious rash, lesion, or ulcer.   LABORATORY PANEL:   CBC  Recent Labs Lab 04/20/16 1535  WBC 9.5  HGB 16.5  HCT 48.6  PLT 236   ------------------------------------------------------------------------------------------------------------------  Chemistries   Recent Labs Lab 04/20/16 1535  NA 137  K 3.8  CL 103  CO2 25  GLUCOSE 106*  BUN 11  CREATININE 1.36*  CALCIUM 9.4   ------------------------------------------------------------------------------------------------------------------  Cardiac Enzymes No results for input(s): TROPONINI in the last 168 hours. ------------------------------------------------------------------------------------------------------------------  RADIOLOGY:  No results found.   IMPRESSION AND PLAN:   * Angioedema Etiology unknown. Patient is not on any medications. Symptoms have lasted about 24 hours and seemed to be slowly worsening. Waiting for ENT. Start prednisone, H2 blockers. Benadryl. DC in a.m. if stable.  * Diabetes mellitus type 2 Place on sliding scale insulin. Check HbA1c.  *  Hypertension Not taking any medications. We'll start Norvasc.  * DVT prophylaxis with Lovenox  All the records are reviewed and case discussed with ED provider. Management plans discussed with the patient, family and they are in agreement.  CODE STATUS: FULL CODE  TOTAL TIME TAKING CARE OF THIS PATIENT: 40 minutes.   Milagros Loll R M.D on 04/20/2016 at 4:17 PM  Between 7am to 6pm - Pager - [phone]  After 6pm go to [email or web address] - password [email or web address] [facility]  [place]  Office  [phone]  CC: Primary care physician; No PCP Per Patient  Note: This dictation was prepared with Dragon dictation along with smaller phrase technology. Any transcriptional errors that result from this process are unintentional.

## 2016-04-20 NOTE — Consult Note (Signed)
[facility] - [place] at [facility]   PATIENT NAME: Aaron [name]    MR#:  [id number]  DATE OF BIRTH:  10/30/1975  DATE OF ADMISSION:  04/20/2016  PRIMARY CARE PHYSICIAN: No PCP Per Patient   CONSULT REQUESTING/REFERRING PHYSICIAN: Dr. Raynelle Chary  REASON FOR CONSULT: Lip swelling  CHIEF COMPLAINT:   Chief Complaint  Patient presents with  . Oral Swelling    HISTORY OF PRESENT ILLNESS:  Aaron [name]  is a 40 y.o. male with a known history of Hypertension, diabetes, eczema not on medications presents to the hospital complaining of upper lip swelling since yesterday. Patient was hoping this would get better but today due to no improvement and  noticing some change in his voice and scratchiness in her throat presented to the emergency room. He has received prednisone and with no improvement ENT has been consulted.  He has no shortness of breath. Able to clear his saliva well. Afebrile.  PAST MEDICAL HISTORY:   Past Medical History:  Diagnosis Date  . Diabetes mellitus without complication (HCC)   . Hypertension     PAST SURGICAL HISTOIRY:  History reviewed. No pertinent surgical history.  SOCIAL HISTORY:   Social History  Substance Use Topics  . Smoking status: Current Every Day Smoker  . Smokeless tobacco: Never Used  . Alcohol use Yes    FAMILY HISTORY:  No family history on file.  No DM DRUG ALLERGIES:   Allergies  Allergen Reactions  . Levaquin [Levofloxacin] Itching and Rash    REVIEW OF SYSTEMS:   ROS  CONSTITUTIONAL: No fever, fatigue or weakness.  EYES: No blurred or double vision.  EARS, NOSE, AND THROAT: No tinnitus or ear pain. Upper lip swelling RESPIRATORY: No cough, shortness of breath, wheezing or hemoptysis.  CARDIOVASCULAR: No chest pain, orthopnea, edema.  GASTROINTESTINAL: No nausea, vomiting, diarrhea or abdominal pain.  GENITOURINARY: No dysuria, hematuria.  ENDOCRINE: No polyuria, nocturia,  HEMATOLOGY:  No anemia, easy bruising or bleeding SKIN: No rash or lesion. MUSCULOSKELETAL: No joint pain or arthritis.   NEUROLOGIC: No tingling, numbness, weakness.  PSYCHIATRY: No anxiety or depression.   MEDICATIONS AT HOME:   Prior to Admission medications   Medication Sig Start Date End Date Taking? Authorizing Provider  hydrocortisone valerate ointment (WESTCORT) 0.2 % Apply to affected area daily 02/22/16 02/21/17 Yes Joni Reining, PA-C      VITAL SIGNS:  Blood pressure (!) 152/102, pulse 70, temperature 98.5 F (36.9 C), temperature source Oral, resp. rate 18, height 6' (1.829 m), weight 97.5 kg (215 lb), SpO2 100 %.  PHYSICAL EXAMINATION:  GENERAL:  40 y.o.-year-old patient lying in the bed with no acute distress.  EYES: Pupils equal, round, reactive to light and accommodation. No scleral icterus. Extraocular muscles intact.  HEENT: Head atraumatic, normocephalic. Oropharynx and nasopharynx clear. Upper lip swelling NECK:  Supple, no jugular venous distention. No thyroid enlargement, no tenderness.  LUNGS: Normal breath sounds bilaterally, no wheezing, rales,rhonchi or crepitation. No use of accessory muscles of respiration.  CARDIOVASCULAR: S1, S2 normal. No murmurs, rubs, or gallops.  ABDOMEN: Soft, nontender, nondistended. Bowel sounds present. No organomegaly or mass.  EXTREMITIES: No pedal edema, cyanosis, or clubbing.  NEUROLOGIC: Cranial nerves II through XII are intact. Muscle strength 5/5 in all extremities. Sensation intact. Gait not checked.  PSYCHIATRIC: The patient is alert and oriented x 3.  SKIN: No obvious rash, lesion, or ulcer.   LABORATORY PANEL:   CBC  Recent Labs Lab 04/20/16 1535  WBC  9.5  HGB 16.5  HCT 48.6  PLT 236   ------------------------------------------------------------------------------------------------------------------  Chemistries   Recent Labs Lab 04/20/16 1535  NA 137  K 3.8  CL 103  CO2 25  GLUCOSE 106*  BUN 11  CREATININE  1.36*  CALCIUM 9.4   ------------------------------------------------------------------------------------------------------------------  Cardiac Enzymes No results for input(s): TROPONINI in the last 168 hours. ------------------------------------------------------------------------------------------------------------------  RADIOLOGY:  No results found.  EKG:   Orders placed or performed in visit on 07/12/09  . EKG 12-Lead    IMPRESSION AND PLAN:   * Angioedema Etiology unknown. Patient is not on any medications. Symptoms have lasted about 24 hours. No stridor, SOB, dysphagia. Waiting for ENT. Start prednisone, H2 blockers. Benadryl.  * Diabetes mellitus type 2 Check HbA1c  * Hypertension Not taking any medications.   All the records are reviewed and case discussed with Consulting provider. Management plans discussed with the patient, family and they are in agreement.  TOTAL TIME TAKING CARE OF THIS PATIENT: 30 minutes.   Milagros LollSudini, Deniece Rankin R M.D on 04/20/2016 at 5:13 PM  Between 7am to 6pm - Pager - [phone]  After 6pm go to [email or web address] - password [email or web address]  [facility]  Office  [phone]  CC: Primary care Physician: No PCP Per Patient  Note: This dictation was prepared with Dragon dictation along with smaller phrase technology. Any transcriptional errors that result from this process are unintentional.

## 2016-04-20 NOTE — Consult Note (Signed)
Aaron [name], [place] [id number] June 01, 1976 Aaron Nearing, MD  Reason for Consult: Lip swelling Requesting Physician: Hillary Bow, MD Consulting Physician: Aaron Nearing, MD  HPI: This 40 y.o. year old male was admitted on 04/20/2016 for swelling lips. This started yesterday morning, and worsened the past 24 hours despite taking Benadryl. He denies and associated rash or generalized itching (other than his eczema). He did have shellfish yesterday, but this was after the itching started. The only medication he takes is a steroid cream he uses for the eczema, and says it is possible some could have gotten on the lips when he applied it to the face, but certainly did not cause itching or swelling anywhere else it was applied. Denies any known food allergies and is not on any other meds, including OTC meds. Has not used NSAIDs. He was having some scratchy sensation in the throat, but no trouble swallowing or breathing. The ER physician had noted some uvula edema when he first came in. At this point he says the lip swelling is starting to improve, and wonders about going home. Medications:  Current Facility-Administered Medications  Medication Dose Route Frequency Provider Last Rate Last Dose  . 0.9 %  sodium chloride infusion  250 mL Intravenous PRN Hillary Bow, MD      . acetaminophen (TYLENOL) tablet 650 mg  650 mg Oral Q6H PRN Hillary Bow, MD       Or  . acetaminophen (TYLENOL) suppository 650 mg  650 mg Rectal Q6H PRN Srikar Sudini, MD      . albuterol (PROVENTIL) (2.5 MG/3ML) 0.083% nebulizer solution 2.5 mg  2.5 mg Nebulization Q2H PRN Srikar Sudini, MD      . amLODipine (NORVASC) tablet 10 mg  10 mg Oral Daily Srikar Sudini, MD      . diphenhydrAMINE (BENADRYL) capsule 25 mg  25 mg Oral Q6H PRN Srikar Sudini, MD      . enoxaparin (LOVENOX) injection 40 mg  40 mg Subcutaneous Q24H Srikar Sudini, MD      . famotidine (PEPCID) tablet 20 mg  20 mg Oral BID Srikar Sudini, MD      . hydrALAZINE  (APRESOLINE) injection 10 mg  10 mg Intravenous Q6H PRN Srikar Sudini, MD      . insulin aspart (novoLOG) injection 0-9 Units  0-9 Units Subcutaneous TID WC Srikar Sudini, MD      . ondansetron (ZOFRAN) tablet 4 mg  4 mg Oral Q6H PRN Srikar Sudini, MD       Or  . ondansetron (ZOFRAN) injection 4 mg  4 mg Intravenous Q6H PRN Srikar Sudini, MD      . polyethylene glycol (MIRALAX / GLYCOLAX) packet 17 g  17 g Oral Daily PRN Hillary Bow, MD      . Derrill Memo ON 04/21/2016] predniSONE (DELTASONE) tablet 50 mg  50 mg Oral Q breakfast Srikar Sudini, MD      . sodium chloride flush (NS) 0.9 % injection 3 mL  3 mL Intravenous Q12H Srikar Sudini, MD      . sodium chloride flush (NS) 0.9 % injection 3 mL  3 mL Intravenous PRN Hillary Bow, MD       Current Outpatient Prescriptions  Medication Sig Dispense Refill  . hydrocortisone valerate ointment (WESTCORT) 0.2 % Apply to affected area daily 45 g 1  .  (Not in a hospital admission)  Allergies:  Allergies  Allergen Reactions  . Levaquin [Levofloxacin] Itching and Rash    PMH:  Past Medical History:  Diagnosis Date  . Diabetes mellitus without complication ([place])   . Hypertension     Fam Hx: No family history on file.  Soc Hx:  Social History   Social History  . Marital status: Single    Spouse name: N/A  . Number of children: N/A  . Years of education: N/A   Occupational History  . Not on file.   Social History Main Topics  . Smoking status: Current Every Day Smoker  . Smokeless tobacco: Never Used  . Alcohol use Yes  . Drug use: Unknown  . Sexual activity: Not on file   Other Topics Concern  . Not on file   Social History Narrative  . No narrative on file    PSH: History reviewed. No pertinent surgical history.. Procedures since admission: No admission procedures for hospital encounter.  ROS: Review of systems normal other than 12 systems except per HPI.  PHYSICAL EXAM  Vitals: Blood pressure (!) 152/102, pulse 70,  temperature 98.5 F (36.9 C), temperature source Oral, resp. rate 18, height 6' (1.829 m), weight 97.5 kg (215 lb), SpO2 100 %.. General: Well-developed, Well-nourished in no acute distress Mood: Mood and affect well adjusted, pleasant and cooperative. Orientation: Grossly alert and oriented. Vocal Quality: No hoarseness. Communicates verbally. head and Face: NCAT. No facial asymmetry. No visible skin lesions. No significant facial scars. No tenderness with sinus percussion. Facial strength normal and symmetric. Ears: External ears with normal landmarks, no lesions. External auditory canals free of infection, cerumen impaction or lesions. Tympanic membranes intact with good landmarks and normal mobility on pneumatic otoscopy. No middle ear effusion. Hearing: Speech reception grossly normal. Nose: External nose normal with midline dorsum and no lesions or deformity. Nasal Cavity reveals essentially midline septum with normal inferior turbinates. No significant mucosal congestion or erythema. Nasal secretions are minimal and clear. No polyps seen on anterior rhinoscopy. Oral Cavity/ Oropharynx: Lips show edema of the upper lip without erythema, though some splits in the mucosa and slight tenderness to palpation. Teeth no frank dental caries. Gingiva healthy with no lesions or gingivitis. Oropharynx including tongue, buccal mucosa, floor of mouth, hard and soft palate, uvula and posterior pharynx free of exudates, erythema or lesions with normal symmetry and hydration. There may be some minimal uvular edema, but nothing significant. Tonsils are 2+ without exudate. Indirect Laryngoscopy/Nasopharyngoscopy: Visualization of the larynx, hypopharynx and nasopharynx is not possible in this setting with routine examination. Neck: Supple and symmetric with no palpable masses, tenderness or crepitance. The trachea is midline. Thyroid gland is soft, nontender and symmetric with no masses or enlargement. Parotid and  submandibular glands are soft, nontender and symmetric, without masses. Lymphatic: Cervical lymph nodes are without palpable lymphadenopathy or tenderness. Respiratory: Normal respiratory effort without labored breathing. Cardiovascular: Carotid pulse shows regular rate and rhythm Neurologic: Cranial Nerves II through XII are grossly intact. Eyes: Gaze and Ocular Motility are grossly normal. PERRLA. No visible nystagmus.  MEDICAL DECISION MAKING: Data Review:  Results for orders placed or performed during the hospital encounter of 04/20/16 (from the past 48 hour(s))  CBC with Differential     Status: Abnormal   Collection Time: 04/20/16  3:35 PM  Result Value Ref Range   WBC 9.5 3.8 - 10.6 K/uL   RBC 5.51 4.40 - 5.90 MIL/uL   Hemoglobin 16.5 13.0 - 18.0 g/dL   HCT 48.6 40.0 - 52.0 %   MCV 88.2 80.0 - 100.0 fL   MCH 30.0 26.0 - 34.0 pg   MCHC  34.0 32.0 - 36.0 g/dL   RDW 15.0 (H) 11.5 - 14.5 %   Platelets 236 150 - 440 K/uL   Neutrophils Relative % 85 %   Neutro Abs 8.0 (H) 1.4 - 6.5 K/uL   Lymphocytes Relative 10 %   Lymphs Abs 1.0 1.0 - 3.6 K/uL   Monocytes Relative 4 %   Monocytes Absolute 0.4 0.2 - 1.0 K/uL   Eosinophils Relative 0 %   Eosinophils Absolute 0.0 0 - 0.7 K/uL   Basophils Relative 1 %   Basophils Absolute 0.1 0 - 0.1 K/uL  Basic metabolic panel     Status: Abnormal   Collection Time: 04/20/16  3:35 PM  Result Value Ref Range   Sodium 137 135 - 145 mmol/L   Potassium 3.8 3.5 - 5.1 mmol/L   Chloride 103 101 - 111 mmol/L   CO2 25 22 - 32 mmol/L   Glucose, Bld 106 (H) 65 - 99 mg/dL   BUN 11 6 - 20 mg/dL   Creatinine, Ser 1.36 (H) 0.61 - 1.24 mg/dL   Calcium 9.4 8.9 - 10.3 mg/dL   GFR calc non Af Amer >60 >60 mL/min   GFR calc Af Amer >60 >60 mL/min    Comment: (NOTE) The eGFR has been calculated using the CKD EPI equation. This calculation has not been validated in all clinical situations. eGFR's persistently <60 mL/min signify possible Chronic  Kidney Disease.    Anion gap 9 5 - 15  . No results found..    ASSESSMENT: Lip edema of uncertain etiology without respiratory distress  PLAN: ER physician will reassess from baseline exam to determine whether he can be discharged, but either observe or consider d/c on steroid taper and antihistamines. I see no absolute need for laryngoscopic exam, though this was discussed with the patient as an option to assess extent of the reaction, and he declined. Could consider sending blood for RAST test for hidden foods (wheat, corn, milk, soy, peanut, egg, malt) and a shellfish panel to assess for allergy, and labs for possible hereditary angioedema (C1 esterase inhibitor deficiency). Also consider providing a prescription for an Epipen in case he has a future more severe reaction.   Aaron Nearing, MD 04/20/2016 4:48 PM

## 2016-04-20 NOTE — ED Notes (Signed)
Pt refused admission, EDP aware, admitting MD aware, bed request canceled

## 2016-04-20 NOTE — ED Triage Notes (Signed)
Pt c/o upper lip swelling that started yesterday, states today having itching in throat, denies any difficulty swallowing or breathing at present..Marland Kitchen

## 2016-04-21 ENCOUNTER — Telehealth: Payer: Self-pay | Admitting: Emergency Medicine

## 2016-04-21 LAB — HEMOGLOBIN A1C
Hgb A1c MFr Bld: 6.1 % — ABNORMAL HIGH (ref 4.8–5.6)
Mean Plasma Glucose: 128 mg/dL

## 2016-04-21 NOTE — Telephone Encounter (Signed)
Called patient due to abnormal result of lab test after he hleft.  A1c is 6.1  .  I do not see pcp listed, so would like to discuss follow up options with patient.

## 2016-05-05 ENCOUNTER — Emergency Department: Payer: Self-pay

## 2016-05-05 ENCOUNTER — Encounter: Payer: Self-pay | Admitting: Emergency Medicine

## 2016-05-05 ENCOUNTER — Emergency Department
Admission: EM | Admit: 2016-05-05 | Discharge: 2016-05-05 | Disposition: A | Discharge status: Home / Self Care | Payer: Self-pay | Attending: Emergency Medicine | Admitting: Emergency Medicine

## 2016-05-05 DIAGNOSIS — J441 Chronic obstructive pulmonary disease with (acute) exacerbation: Secondary | ICD-10-CM | POA: Insufficient documentation

## 2016-05-05 DIAGNOSIS — E119 Type 2 diabetes mellitus without complications: Secondary | ICD-10-CM | POA: Insufficient documentation

## 2016-05-05 DIAGNOSIS — I10 Essential (primary) hypertension: Secondary | ICD-10-CM | POA: Insufficient documentation

## 2016-05-05 DIAGNOSIS — F17200 Nicotine dependence, unspecified, uncomplicated: Secondary | ICD-10-CM | POA: Insufficient documentation

## 2016-05-05 DIAGNOSIS — J45909 Unspecified asthma, uncomplicated: Secondary | ICD-10-CM | POA: Insufficient documentation

## 2016-05-05 HISTORY — DX: Unspecified asthma, uncomplicated: J45.909

## 2016-05-05 LAB — CBC
HEMATOCRIT: 45 % (ref 40.0–52.0)
HEMOGLOBIN: 16 g/dL (ref 13.0–18.0)
MCH: 30.6 pg (ref 26.0–34.0)
MCHC: 35.5 g/dL (ref 32.0–36.0)
MCV: 86.3 fL (ref 80.0–100.0)
Platelets: 271 10*3/uL (ref 150–440)
RBC: 5.22 MIL/uL (ref 4.40–5.90)
RDW: 14.9 % — ABNORMAL HIGH (ref 11.5–14.5)
WBC: 7.9 10*3/uL (ref 3.8–10.6)

## 2016-05-05 LAB — BASIC METABOLIC PANEL
ANION GAP: 7 (ref 5–15)
BUN: 13 mg/dL (ref 6–20)
CHLORIDE: 106 mmol/L (ref 101–111)
CO2: 25 mmol/L (ref 22–32)
Calcium: 9.3 mg/dL (ref 8.9–10.3)
Creatinine, Ser: 1.26 mg/dL — ABNORMAL HIGH (ref 0.61–1.24)
GFR calc non Af Amer: >60 mL/min (ref >60)
Glucose, Bld: 115 mg/dL — ABNORMAL HIGH (ref 65–99)
POTASSIUM: 3.4 mmol/L — AB (ref 3.5–5.1)
Sodium: 138 mmol/L (ref 135–145)

## 2016-05-05 LAB — TROPONIN I: Troponin I: <0.03 ng/mL (ref <0.03)

## 2016-05-05 MED ORDER — PREDNISONE 20 MG PO TABS: 60.0000 mg | ORAL_TABLET | Freq: Every day | ORAL | 0 refills | Status: AC

## 2016-05-05 MED ORDER — IPRATROPIUM-ALBUTEROL 0.5-2.5 (3) MG/3ML IN SOLN
3.0000 mL | Freq: Once | RESPIRATORY_TRACT | Status: AC
  Administered 2016-05-05: 3 mL via RESPIRATORY_TRACT
  Filled 2016-05-05: qty 3

## 2016-05-05 MED ORDER — IPRATROPIUM-ALBUTEROL 0.5-2.5 (3) MG/3ML IN SOLN
3.0000 mL | Freq: Once | RESPIRATORY_TRACT | Status: AC
  Administered 2016-05-05: 3 mL via RESPIRATORY_TRACT

## 2016-05-05 MED ORDER — ALBUTEROL SULFATE (2.5 MG/3ML) 0.083% IN NEBU
5.0000 mg | INHALATION_SOLUTION | Freq: Once | RESPIRATORY_TRACT | Status: AC
Start: 2016-05-05 — End: 2016-05-05
  Administered 2016-05-05: 5 mg via RESPIRATORY_TRACT
  Filled 2016-05-05: qty 6

## 2016-05-05 MED ORDER — IPRATROPIUM-ALBUTEROL 0.5-2.5 (3) MG/3ML IN SOLN
3.0000 mL | Freq: Once | RESPIRATORY_TRACT | Status: AC
  Administered 2016-05-05: 3 mL via RESPIRATORY_TRACT
  Filled 2016-05-05 (×2): qty 3

## 2016-05-05 MED ORDER — IPRATROPIUM-ALBUTEROL 0.5-2.5 (3) MG/3ML IN SOLN
RESPIRATORY_TRACT | Status: AC
  Filled 2016-05-05: qty 3

## 2016-05-05 MED ORDER — AZITHROMYCIN 500 MG PO TABS
500.0000 mg | ORAL_TABLET | Freq: Every day | ORAL | Status: DC
  Administered 2016-05-05: 500 mg via ORAL
  Filled 2016-05-05: qty 1

## 2016-05-05 MED ORDER — AZITHROMYCIN 250 MG PO TABS: ORAL_TABLET | ORAL | 0 refills | Status: AC

## 2016-05-05 MED ORDER — METHYLPREDNISOLONE SODIUM SUCC 125 MG IJ SOLR
125.0000 mg | Freq: Once | INTRAMUSCULAR | Status: AC
  Administered 2016-05-05: 125 mg via INTRAVENOUS
  Filled 2016-05-05: qty 2

## 2016-05-05 NOTE — ED Provider Notes (Signed)
[facility] Emergency Department Provider Note  ____________________________________________  Time seen: Approximately 3:51 PM  I have reviewed the triage vital signs and the nursing notes.   HISTORY  Chief Complaint Shortness of Breath   HPI Aaron [name] is a 40 y.o. male history of asthma, COPD, active smoker, diabetes, hypertension who presents for evaluation of shortness of breath and wheezing. Patient reports that he has been having progressively worsening shortness of breath and wheezing since yesterday. Reports that he hardly ever uses his inhaler at home and has not used it today. He is here because his wife forced him to come. Also endorses chest tightness, midline, nonradiating, associated with his wheezing and shortness of breath. Patient has a strong family history of ischemic heart disease but no personal history. He reports chronic cough however over the course of the last 3 days has been more pronounced and this morning was productive of brown sputum. No vomiting, no fever, no chills, no diarrhea, no abdominal pain.  Past Medical History:  Diagnosis Date  . Asthma   . Diabetes mellitus without complication (HCC)   . Hypertension     Patient Active Problem List   Diagnosis Date Noted  . 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 No pertinent surgical history.  Prior to Admission medications   Medication Sig Start Date End Date Taking? Authorizing Provider  hydrocortisone valerate ointment (WESTCORT) 0.2 % Apply to affected area daily 02/22/16 02/21/17 Yes Joni Reiningonald K Smith, PA-C  azithromycin (ZITHROMAX Z-PAK) 250 MG tablet 1 tablet a day 05/05/16 05/10/16  Nita Sicklearolina Lynna Zamorano, MD  predniSONE (DELTASONE) 20 MG tablet Take 3 tablets (60 mg total) by mouth daily. 05/05/16 05/09/16  Nita Sicklearolina Ithiel Liebler, MD    Allergies Levaquin [levofloxacin]  History reviewed. No pertinent family history.  Social History Social History  Substance Use Topics    . Smoking status: Current Every Day Smoker  . Smokeless tobacco: Never Used  . Alcohol use Yes    Review of Systems  Constitutional: Negative for fever. Eyes: Negative for visual changes. ENT: Negative for sore throat. Cardiovascular: Negative for chest pain. Respiratory: + shortness of breath, wheezing, productive cough Gastrointestinal: Negative for abdominal pain, vomiting or diarrhea. Genitourinary: Negative for dysuria. Musculoskeletal: Negative for back pain. Skin: Negative for rash. Neurological: Negative for headaches, weakness or numbness.  ____________________________________________   PHYSICAL EXAM:  VITAL SIGNS: ED Triage Vitals  Enc Vitals Group     BP 05/05/16 1233 (!) 139/101     Pulse Rate 05/05/16 1227 96     Resp 05/05/16 1227 20     Temp 05/05/16 1539 98.2 F (36.8 C)     Temp Source 05/05/16 1539 Oral     SpO2 05/05/16 1227 98 %     Weight 05/05/16 1221 265 lb (120.2 kg)     Height 05/05/16 1221 6' (1.829 m)     Head Circumference --      Peak Flow --      Pain Score 05/05/16 1221 6     Pain Loc --      Pain Edu? --      Excl. in GC? --     Constitutional: Alert and oriented. Well appearing and in no apparent distress. HEENT:      Head: Normocephalic and atraumatic.         Eyes: Conjunctivae are normal. Sclera is non-icteric. EOMI. PERRL      Mouth/Throat: Mucous membranes are moist.       Neck: Supple  with no signs of meningismus. Cardiovascular: Regular rate and rhythm. No murmurs, gallops, or rubs. 2+ symmetrical distal pulses are present in all extremities. No JVD. Respiratory: Normal respiratory effort, normal sats, severely diminished air movement bilaterally with faint expiratory wheezes Gastrointestinal: Soft, non tender, and non distended with positive bowel sounds. No rebound or guarding. Genitourinary: No CVA tenderness. Musculoskeletal: Nontender with normal range of motion in all extremities. No edema, cyanosis, or erythema of  extremities. Neurologic: Normal speech and language. Face is symmetric. Moving all extremities. No gross focal neurologic deficits are appreciated. Skin: Skin is warm, dry and intact. No rash noted. Psychiatric: Mood and affect are normal. Speech and behavior are normal.  ____________________________________________   LABS (all labs ordered are listed, but only abnormal results are displayed)  Labs Reviewed  BASIC METABOLIC PANEL - Abnormal; Notable for the following:       Result Value   Potassium 3.4 (*)    Glucose, Bld 115 (*)    Creatinine, Ser 1.26 (*)    All other components within normal limits  CBC - Abnormal; Notable for the following:    RDW 14.9 (*)    All other components within normal limits  TROPONIN I  TROPONIN I   ____________________________________________  EKG  ED ECG REPORT I, Nita Sickle, the attending physician, personally viewed and interpreted this ECG.  Normal sinus rhythm, rate of 93, normal intervals, right axis deviation, no ST elevations or depressions. Unchanged from prior. ____________________________________________  RADIOLOGY  CXR: negative  ____________________________________________   PROCEDURES  Procedure(s) performed: None Procedures Critical Care performed:  None ____________________________________________   INITIAL IMPRESSION / ASSESSMENT AND PLAN / ED COURSE  40 y.o. male history of asthma, COPD, active smoker, diabetes, hypertension who presents for evaluation of shortness of breath and wheezing concerning for acute exacerbation. Patient with no oxygen requirement and breathing comfortably on room air. Has severely diminished air movement bilaterally with faint expiratory wheezes. EKG with no evidence of ischemia. Will give duoneb x 3, solumedrol, azithromycin. Check troponin x 2. CXR with no infiltrate and no edema. Smoking cessation counseling provided.   Clinical Course  Comment By Time  Patient requesting  discharge. He is moving good air with no wheezing. Second troponin was negative. Patient be discharged home with DuoNeb treatment, prednisone, azithromycin, and follow-up with primary care doctor. Nita Sickle, MD 09/28 1708    Pertinent labs & imaging results that were available during my care of the patient were reviewed by me and considered in my medical decision making (see chart for details).    ____________________________________________   FINAL CLINICAL IMPRESSION(S) / ED DIAGNOSES  Final diagnoses:  COPD exacerbation (HCC)      NEW MEDICATIONS STARTED DURING THIS VISIT:  New Prescriptions   AZITHROMYCIN (ZITHROMAX Z-PAK) 250 MG TABLET    1 tablet a day   PREDNISONE (DELTASONE) 20 MG TABLET    Take 3 tablets (60 mg total) by mouth daily.     Note:  This document was prepared using Dragon voice recognition software and may include unintentional dictation errors.    Nita Sickle, MD 05/05/16 1710

## 2016-05-05 NOTE — Discharge Instructions (Signed)

## 2016-05-05 NOTE — ED Notes (Signed)
Still diminished after tx, slight expiratory wheeze now heard.

## 2016-05-05 NOTE — ED Triage Notes (Addendum)
Pt has had chest pain and shortness of breath. Labored in triage. Minimal air movement; can barely auscultate breath sounds. Has had central chest pain. Denies fevers.

## 2017-06-13 ENCOUNTER — Emergency Department
Admission: EM | Admit: 2017-06-13 | Discharge: 2017-06-13 | Disposition: A | Discharge status: Home / Self Care | Payer: Self-pay | Attending: Student in an Organized Health Care Education/Training Program | Admitting: Student in an Organized Health Care Education/Training Program

## 2017-06-13 ENCOUNTER — Encounter: Payer: Self-pay | Admitting: Emergency Medicine

## 2017-06-13 DIAGNOSIS — E119 Type 2 diabetes mellitus without complications: Secondary | ICD-10-CM | POA: Insufficient documentation

## 2017-06-13 DIAGNOSIS — E860 Dehydration: Secondary | ICD-10-CM | POA: Insufficient documentation

## 2017-06-13 DIAGNOSIS — R3911 Hesitancy of micturition: Secondary | ICD-10-CM

## 2017-06-13 DIAGNOSIS — J45909 Unspecified asthma, uncomplicated: Secondary | ICD-10-CM | POA: Insufficient documentation

## 2017-06-13 DIAGNOSIS — I10 Essential (primary) hypertension: Secondary | ICD-10-CM | POA: Insufficient documentation

## 2017-06-13 DIAGNOSIS — F17200 Nicotine dependence, unspecified, uncomplicated: Secondary | ICD-10-CM | POA: Insufficient documentation

## 2017-06-13 LAB — BASIC METABOLIC PANEL
Anion gap: 8 (ref 5–15)
BUN: 16 mg/dL (ref 6–20)
CHLORIDE: 102 mmol/L (ref 101–111)
CO2: 28 mmol/L (ref 22–32)
CREATININE: 1.47 mg/dL — AB (ref 0.61–1.24)
Calcium: 9.3 mg/dL (ref 8.9–10.3)
GFR calc Af Amer: >60 mL/min (ref >60)
GFR calc non Af Amer: 58 mL/min — ABNORMAL LOW (ref >60)
GLUCOSE: 81 mg/dL (ref 65–99)
POTASSIUM: 3.4 mmol/L — AB (ref 3.5–5.1)
SODIUM: 138 mmol/L (ref 135–145)

## 2017-06-13 LAB — URINALYSIS, COMPLETE (UACMP) WITH MICROSCOPIC
BILIRUBIN URINE: NEGATIVE
Bacteria, UA: NONE SEEN
GLUCOSE, UA: NEGATIVE mg/dL
Ketones, ur: NEGATIVE mg/dL
LEUKOCYTES UA: NEGATIVE
NITRITE: NEGATIVE
Protein, ur: 30 mg/dL — AB
SPECIFIC GRAVITY, URINE: 1.026 (ref 1.005–1.030)
pH: 5 (ref 5.0–8.0)

## 2017-06-13 LAB — CHLAMYDIA/NGC RT PCR (ARMC ONLY)
Chlamydia Tr: NOT DETECTED
N gonorrhoeae: NOT DETECTED

## 2017-06-13 MED ORDER — TAMSULOSIN HCL 0.4 MG PO CAPS: 0.4000 mg | ORAL_CAPSULE | Freq: Every day | ORAL | 0 refills | Status: DC

## 2017-06-13 MED ORDER — SODIUM CHLORIDE 0.9 % IV BOLUS (SEPSIS)
1000.0000 mL | Freq: Once | INTRAVENOUS | Status: AC
  Administered 2017-06-13: 1000 mL via INTRAVENOUS

## 2017-06-13 NOTE — ED Notes (Signed)
Pt voided 175cc

## 2017-06-13 NOTE — ED Notes (Signed)
Pt states he has been unable to void since 930pm last night - pt has never had this issue before - pt c/o testicle and penis pain

## 2017-06-13 NOTE — ED Provider Notes (Signed)
[facility] Emergency Department Provider Note    First MD Initiated Contact with Patient 06/13/17 1428     (approximate)  I have reviewed the triage vital signs and the nursing notes.   HISTORY  Chief Complaint Urinary Retention    HPI Aaron [name] is a 41 y.o. male who presents with urinary retention started last night.  States he does have some pressure and fullness in his scrotum.  States he did urinate last night.  Area.  No previous history of issues Neri tract infection or prostate enlargement.  Denies any nausea or vomiting.  No penile discharge.  No recent changes any medications.  No numbness or tingling  Past Medical History:  Diagnosis Date  . Asthma   . Diabetes mellitus without complication (HCC)   . Hypertension    No family history on file. History reviewed. No pertinent surgical history. Patient Active Problem List   Diagnosis Date Noted  . 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 Smoking status: Current Every Day Smoker  . Smokeless tobacco: Never Used  Substance Use Topics  . Alcohol use: Yes  . Drug use: No    Review of Systems Patient denies headaches, rhinorrhea, blurry vision, numbness, shortness of breath, chest pain, edema, cough, abdominal pain, nausea, vomiting, diarrhea, dysuria, fevers, rashes or hallucinations unless otherwise stated above in HPI. ____________________________________________   PHYSICAL EXAM:  VITAL SIGNS: Vitals:   06/13/17 1243  BP: (!) 144/76  Pulse: 92  Resp: 18  Temp: 97.8 F (36.6 C)  SpO2: 96%    Constitutional: Alert and oriented. Well appearing and in no acute distress. Eyes: Conjunctivae are normal.  Head: Atraumatic. Nose: No congestion/rhinnorhea. Mouth/Throat: Mucous membranes are moist.   Neck: No stridor. Painless ROM.    Cardiovascular: Normal rate, regular rhythm. Grossly normal heart sounds.  Good peripheral circulation. Respiratory: Normal respiratory effort.  No retractions. Lungs CTAB. Gastrointestinal: Soft and nontender. No distention. No abdominal bruits. No CVA tenderness. Genitourinary: Normal external genitalia.  Bilateral cremasteric reflex present, no masses or swelling Musculoskeletal: No lower extremity tenderness nor edema.  No joint effusions. Neurologic:  Normal speech and language. No gross focal neurologic deficits are appreciated. No facial droop Skin:  Skin is warm, dry and intact. No rash noted. Psychiatric: Mood and affect are normal. Speech and behavior are normal.  ____________________________________________   LABS (all labs ordered are listed, but only abnormal results are displayed)  No results found for this or any previous visit (from the past 24 hour(s)). ____________________________________________ ____________________________________________  RADIOLOGY  EMERGENCY DEPARTMENT ULTRASOUND  Study: Limited Ultrasound of Bladder  INDICATIONS: to assess for urinary retention and/or bladder volume prior to urinary catheter Multiple views of the bladder were obtained in real-time in the transverse and longitudinal planes with a multi-frequency probe.  PERFORMED BY: Myself IMAGES ARCHIVED?: Yes LIMITATIONS:  none INTERPRETATION: Minimal Volume  96cc       ____________________________________________   PROCEDURES  Procedure(s) performed:  Procedures    Critical Care performed: no ____________________________________________   INITIAL IMPRESSION / ASSESSMENT AND PLAN / ED COURSE  Pertinent labs & imaging results that were available during my care of the patient were reviewed by me and considered in my medical decision making (see chart for details).  DDX: uti, urethritis, aki, oliguria, bph  Aaron [name] is a  41 y.o. who presents to the ED with  decreased urine output since 930 last night.  Bedside ultrasound shows small volume in the bladder.  Blood work sent for the above differential shows mild renal insufficiency without evidence of acidosis or hyperkalemia.  Urinalysis shows some trace blood but no evidence of bacteria.  No evidence of torsion or epididymitis.  No masses.  I do suspect some component of intermittent hesitancy therefore will start on Flomax and refer to outpatient follow-up.  His abdominal exam is otherwise soft and benign.  He was able to spontaneously void and is tolerating oral hydration.  Given 1 L of fluid for hydration.      ____________________________________________   FINAL CLINICAL IMPRESSION(S) / ED DIAGNOSES  Final diagnoses:  Urinary hesitancy  Dehydration      NEW MEDICATIONS STARTED DURING THIS VISIT:  This SmartLink is deprecated. Use AVSMEDLIST instead to display the medication list for a patient.   Note:  This document was prepared using Dragon voice recognition software and may include unintentional dictation errors.    Willy Eddyobinson, Kensley Valladares, MD 06/13/17 [phone]

## 2017-06-13 NOTE — ED Triage Notes (Signed)
Pt comes into the ED via POV c/o urinary retention that started this morning.  Patient has lower abdominal pressure and "fullness" described in his scrotum.  Denies any new sexual partners or h/o chlamydia.  Patient able to urinate last night before bed but unsuccessful this morning.  Bladder scan only shows 75 ml.  Patient in NAD at this time with even and unlabored respirations and ambulatory to triage at this time.

## 2017-06-13 NOTE — ED Notes (Signed)
Bladder scan patient 0.9275ml

## 2017-11-19 ENCOUNTER — Emergency Department
Admission: EM | Admit: 2017-11-19 | Discharge: 2017-11-19 | Disposition: A | Discharge status: Home / Self Care | Payer: Self-pay | Attending: Emergency Medicine | Admitting: Emergency Medicine

## 2017-11-19 ENCOUNTER — Encounter: Payer: Self-pay | Admitting: Emergency Medicine

## 2017-11-19 DIAGNOSIS — E119 Type 2 diabetes mellitus without complications: Secondary | ICD-10-CM | POA: Insufficient documentation

## 2017-11-19 DIAGNOSIS — J45909 Unspecified asthma, uncomplicated: Secondary | ICD-10-CM | POA: Insufficient documentation

## 2017-11-19 DIAGNOSIS — M10072 Idiopathic gout, left ankle and foot: Secondary | ICD-10-CM | POA: Insufficient documentation

## 2017-11-19 DIAGNOSIS — M109 Gout, unspecified: Secondary | ICD-10-CM

## 2017-11-19 DIAGNOSIS — Z79899 Other long term (current) drug therapy: Secondary | ICD-10-CM | POA: Insufficient documentation

## 2017-11-19 DIAGNOSIS — Z9114 Patient's other noncompliance with medication regimen: Secondary | ICD-10-CM | POA: Insufficient documentation

## 2017-11-19 DIAGNOSIS — I10 Essential (primary) hypertension: Secondary | ICD-10-CM | POA: Insufficient documentation

## 2017-11-19 DIAGNOSIS — F17200 Nicotine dependence, unspecified, uncomplicated: Secondary | ICD-10-CM | POA: Insufficient documentation

## 2017-11-19 LAB — GLUCOSE, CAPILLARY: Glucose-Capillary: 97 mg/dL (ref 65–99)

## 2017-11-19 MED ORDER — HYDROCHLOROTHIAZIDE 25 MG PO TABS: 25.0000 mg | ORAL_TABLET | Freq: Every day | ORAL | 0 refills | Status: DC

## 2017-11-19 MED ORDER — KETOROLAC TROMETHAMINE 30 MG/ML IJ SOLN
30.0000 mg | Freq: Once | INTRAMUSCULAR | Status: AC
  Administered 2017-11-19: 30 mg via INTRAMUSCULAR
  Filled 2017-11-19: qty 1

## 2017-11-19 MED ORDER — INDOMETHACIN 25 MG PO CAPS: 25.0000 mg | ORAL_CAPSULE | Freq: Three times a day (TID) | ORAL | 0 refills | Status: DC

## 2017-11-19 NOTE — ED Notes (Signed)

## 2017-11-19 NOTE — Discharge Instructions (Signed)
[facility] to make arrangements for your blood pressure to be rechecked.  Begin taking hydrochlorothiazide 25 mg 1 daily for your blood pressure.  This most likely will not be the blood pressure medication that you will continue on.  [facility] your blood [facility].  Read information about hypertension.  Begin taking Indocin 25 mg 3 times a day with food.  Elevate your foot as needed for swelling.

## 2017-11-19 NOTE — ED Provider Notes (Signed)
[facility] Emergency Department Provider Note   ____________________________________________   First MD Initiated Contact with Patient 11/19/17 1127     (approximate)  I have reviewed the triage vital signs and the nursing notes.   HISTORY  Chief Complaint Foot Pain  HPI Aaron [name] is a 42 y.o. male is here complaint of left foot pain and swelling that began yesterday evening.  Patient denies any injury to his foot.  Patient does have a history of gout.  Patient also has not taken his blood pressure medication in over 2 months.  He states he does not have a PCP.  Patient denies any headache, dizziness or chest pain.  He rates his foot pain as 10/10.   Past Medical History:  Diagnosis Date  . Asthma   . Diabetes mellitus without complication (HCC)   . Hypertension     Patient Active Problem List   Diagnosis Date Noted  . 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 No pertinent surgical history.  Prior to Admission medications   Medication Sig Start Date End Date Taking? Authorizing Provider  hydrochlorothiazide (HYDRODIURIL) 25 MG tablet Take 1 tablet (25 mg total) by mouth daily. 11/19/17   Tommi Rumps, PA-C  indomethacin (INDOCIN) 25 MG capsule Take 1 capsule (25 mg total) by mouth 3 (three) times daily with meals. 11/19/17   Tommi Rumps, PA-C  tamsulosin (FLOMAX) 0.4 MG CAPS capsule Take 1 capsule (0.4 mg total) daily after supper by mouth. 06/13/17   Willy Eddy, MD    Allergies Levaquin [levofloxacin]  No family history on file.  Social History Social History   Tobacco Use  . Smoking status: Current Every Day Smoker  . Smokeless tobacco: Never Used  Substance Use Topics  . Alcohol use: Yes  . Drug use: No    Review of Systems Constitutional: No fever/chills Eyes: No visual changes. Cardiovascular: Denies chest pain. Respiratory: Denies shortness of breath. Gastrointestinal: No abdominal pain.  No nausea,  no vomiting.  Musculoskeletal: Positive for left foot pain. Skin: Negative for rash. Neurological: Negative for headaches, focal weakness or numbness. ___________________________________________   PHYSICAL EXAM:  VITAL SIGNS: ED Triage Vitals  Enc Vitals Group     BP 11/19/17 1120 (!) 155/125     Pulse Rate 11/19/17 1120 86     Resp 11/19/17 1120 18     Temp 11/19/17 1120 99 F (37.2 C)     Temp Source 11/19/17 1120 Oral     SpO2 11/19/17 1120 99 %     Weight 11/19/17 1122 180 lb (81.6 kg)     Height 11/19/17 1122 6' (1.829 m)     Head Circumference --      Peak Flow --      Pain Score 11/19/17 1121 10     Pain Loc --      Pain Edu? --      Excl. in GC? --    Constitutional: Alert and oriented. Well appearing and in no acute distress. Eyes: Conjunctivae are normal. PERRL. EOMI. Head: Atraumatic. Neck: No stridor.   Cardiovascular: Normal rate, regular rhythm. Grossly normal heart sounds.  Good peripheral circulation. Respiratory: Normal respiratory effort.  No retractions. Lungs CTAB. Musculoskeletal: On examination of left foot there is some erythema and marked tenderness on palpation of the first metatarsal joint.  Minimal edema is present.  Skin is intact.  Motor sensory function intact.  Capillary refill is less than 3 seconds.  No gross deformity is  present. Neurologic:  Normal speech and language. No gross focal neurologic deficits are appreciated. No gait instability. Skin:  Skin is warm, dry and intact.  Erythema left foot as noted above. Psychiatric: Mood and affect are normal. Speech and behavior are normal.  ____________________________________________   LABS (all labs ordered are listed, but only abnormal results are displayed)  Labs Reviewed  GLUCOSE, CAPILLARY     PROCEDURES  Procedure(s) performed: None  Procedures  Critical Care performed: No  ____________________________________________   INITIAL IMPRESSION / ASSESSMENT AND PLAN / ED  COURSE  As part of my medical decision making, I reviewed the following data within the electronic MEDICAL RECORD NUMBER Notes from prior ED visits and [place] Controlled Substance Database  Patient presents with left foot pain.  Patient has a history of hypertension and is currently not taking medication for the last 2-3 months.  Patient denied any symptoms related to his hypertension.  Patient was given Toradol 30 mg IM for his left foot pain most likely secondary to acute gout.  Patient was also given prescription for hydrochlorothiazide 25 mg 1 daily and encouraged to call [facility] an [facility] as he does not have a PCP and no insurance.  Patient is aware of the risk that he is taking with noncompliance of his medication for hypertension.  ____________________________________________   FINAL CLINICAL IMPRESSION(S) / ED DIAGNOSES  Final diagnoses:  Acute gout of left foot, unspecified cause  Hypertension, unspecified type  Noncompliance with medications     ED Discharge Orders        Ordered    hydrochlorothiazide (HYDRODIURIL) 25 MG tablet  Daily     11/19/17 1250    indomethacin (INDOCIN) 25 MG capsule  3 times daily with meals     11/19/17 1250       Note:  This document was prepared using Dragon voice recognition software and may include unintentional dictation errors.    Tommi RumpsSummers, Cleopatra Sardo L, PA-C 11/19/17 1443    Minna AntisPaduchowski, Kevin, MD 11/19/17 1531

## 2017-11-19 NOTE — ED Triage Notes (Signed)
Patient is complaining of left foot pain and swelling that began yesterday evening.  Patient denies injury to foot.  Patient is in no obvious distress at this time.

## 2017-12-26 ENCOUNTER — Emergency Department
Admission: EM | Admit: 2017-12-26 | Discharge: 2017-12-27 | Disposition: A | Discharge status: Home / Self Care | Payer: Self-pay | Attending: Student in an Organized Health Care Education/Training Program | Admitting: Student in an Organized Health Care Education/Training Program

## 2017-12-26 ENCOUNTER — Encounter: Payer: Self-pay | Admitting: Emergency Medicine

## 2017-12-26 ENCOUNTER — Emergency Department: Payer: Self-pay

## 2017-12-26 ENCOUNTER — Other Ambulatory Visit: Payer: Self-pay

## 2017-12-26 DIAGNOSIS — I10 Essential (primary) hypertension: Secondary | ICD-10-CM | POA: Insufficient documentation

## 2017-12-26 DIAGNOSIS — F17200 Nicotine dependence, unspecified, uncomplicated: Secondary | ICD-10-CM | POA: Insufficient documentation

## 2017-12-26 DIAGNOSIS — E119 Type 2 diabetes mellitus without complications: Secondary | ICD-10-CM | POA: Insufficient documentation

## 2017-12-26 DIAGNOSIS — F15122 Other stimulant abuse with intoxication with perceptual disturbance: Secondary | ICD-10-CM | POA: Insufficient documentation

## 2017-12-26 DIAGNOSIS — R4182 Altered mental status, unspecified: Secondary | ICD-10-CM | POA: Insufficient documentation

## 2017-12-26 DIAGNOSIS — Z79899 Other long term (current) drug therapy: Secondary | ICD-10-CM | POA: Insufficient documentation

## 2017-12-26 DIAGNOSIS — J45909 Unspecified asthma, uncomplicated: Secondary | ICD-10-CM | POA: Insufficient documentation

## 2017-12-26 LAB — URINE DRUG SCREEN, QUALITATIVE (ARMC ONLY)
Amphetamines, Ur Screen: POSITIVE — AB
Barbiturates, Ur Screen: NOT DETECTED
Benzodiazepine, Ur Scrn: NOT DETECTED
COCAINE METABOLITE, UR ~~LOC~~: NOT DETECTED
Cannabinoid 50 Ng, Ur ~~LOC~~: NOT DETECTED
MDMA (ECSTASY) UR SCREEN: NOT DETECTED
METHADONE SCREEN, URINE: NOT DETECTED
OPIATE, UR SCREEN: NOT DETECTED
Phencyclidine (PCP) Ur S: NOT DETECTED
TRICYCLIC, UR SCREEN: NOT DETECTED

## 2017-12-26 LAB — CBC WITH DIFFERENTIAL/PLATELET
BASOS ABS: 0.1 10*3/uL (ref 0–0.1)
BASOS PCT: 1 %
EOS ABS: 0 10*3/uL (ref 0–0.7)
EOS PCT: 1 %
HCT: 46.5 % (ref 40.0–52.0)
Hemoglobin: 15.5 g/dL (ref 13.0–18.0)
LYMPHS PCT: 24 %
Lymphs Abs: 1.8 10*3/uL (ref 1.0–3.6)
MCH: 29.4 pg (ref 26.0–34.0)
MCHC: 33.4 g/dL (ref 32.0–36.0)
MCV: 88 fL (ref 80.0–100.0)
MONO ABS: 0.8 10*3/uL (ref 0.2–1.0)
Monocytes Relative: 11 %
Neutro Abs: 4.7 10*3/uL (ref 1.4–6.5)
Neutrophils Relative %: 63 %
PLATELETS: 323 10*3/uL (ref 150–440)
RBC: 5.28 MIL/uL (ref 4.40–5.90)
RDW: 15.1 % — ABNORMAL HIGH (ref 11.5–14.5)
WBC: 7.4 10*3/uL (ref 3.8–10.6)

## 2017-12-26 LAB — COMPREHENSIVE METABOLIC PANEL
ALBUMIN: 4.6 g/dL (ref 3.5–5.0)
ALK PHOS: 35 U/L — AB (ref 38–126)
ALT: 27 U/L (ref 17–63)
ANION GAP: 10 (ref 5–15)
AST: 41 U/L (ref 15–41)
BUN: 19 mg/dL (ref 6–20)
CALCIUM: 9.9 mg/dL (ref 8.9–10.3)
CO2: 28 mmol/L (ref 22–32)
Chloride: 105 mmol/L (ref 101–111)
Creatinine, Ser: 2.17 mg/dL — ABNORMAL HIGH (ref 0.61–1.24)
GFR calc Af Amer: 41 mL/min — ABNORMAL LOW (ref >60)
GFR calc non Af Amer: 36 mL/min — ABNORMAL LOW (ref >60)
GLUCOSE: 158 mg/dL — AB (ref 65–99)
Potassium: 3.3 mmol/L — ABNORMAL LOW (ref 3.5–5.1)
SODIUM: 143 mmol/L (ref 135–145)
Total Bilirubin: 1.4 mg/dL — ABNORMAL HIGH (ref 0.3–1.2)
Total Protein: 8.5 g/dL — ABNORMAL HIGH (ref 6.5–8.1)

## 2017-12-26 LAB — TROPONIN I

## 2017-12-26 LAB — AMMONIA: Ammonia: 31 umol/L (ref 9–35)

## 2017-12-26 LAB — GLUCOSE, CAPILLARY: GLUCOSE-CAPILLARY: 172 mg/dL — AB (ref 65–99)

## 2017-12-26 LAB — ETHANOL: Alcohol, Ethyl (B): <10 mg/dL (ref <10)

## 2017-12-26 MED ORDER — SODIUM CHLORIDE 0.9 % IV BOLUS
1000.0000 mL | Freq: Once | INTRAVENOUS | Status: AC
  Administered 2017-12-26: 1000 mL via INTRAVENOUS

## 2017-12-26 NOTE — ED Notes (Signed)
Report given to Kala RN. 

## 2017-12-26 NOTE — ED Provider Notes (Signed)
[facility] Emergency Department Provider Note    First MD Initiated Contact with Patient 12/26/17 2044     (approximate)  I have reviewed the triage vital signs and the nursing notes.   HISTORY  Chief Complaint Altered Mental Status  Level V caveat:  Acute encephalopathy  HPI Aaron [name] is a 42 y.o. male history of asthma diabetes and hypertension presents to the ER via EMS after bystanders called EMS the patient fell asleep in the front door of [employer].  Patient states that he feels very sleepy.  Denies any pain.  No chest pain.  Denies any drug use.  No alcohol use.  Denies any headache or fevers.  Uncertain time last seen normal.  Denies any lateralizing weakness or pain.  Past Medical History:  Diagnosis Date  . Asthma   . Diabetes mellitus without complication (HCC)   . Hypertension    History reviewed. No pertinent family history. History reviewed. No pertinent surgical history. Patient Active Problem List   Diagnosis Date Noted  . Angioedema 04/20/2016      Prior to Admission medications   Medication Sig Start Date End Date Taking? Authorizing Provider  hydrochlorothiazide (HYDRODIURIL) 25 MG tablet Take 1 tablet (25 mg total) by mouth daily. 11/19/17   Tommi Rumps, PA-C  indomethacin (INDOCIN) 25 MG capsule Take 1 capsule (25 mg total) by mouth 3 (three) times daily with meals. 11/19/17   Tommi Rumps, PA-C  tamsulosin (FLOMAX) 0.4 MG CAPS capsule Take 1 capsule (0.4 mg total) daily after supper by mouth. 06/13/17   Willy Eddy, MD    Allergies Levaquin [levofloxacin]    Social History Social History   Tobacco Use  . Smoking status: Current Every Day Smoker  . Smokeless tobacco: Never Used  Substance Use Topics  . Alcohol use: Yes  . Drug use: No    Review of Systems Patient denies headaches, rhinorrhea, blurry vision, numbness, shortness of breath, chest pain, edema, cough, abdominal pain, nausea,  vomiting, diarrhea, dysuria, fevers, rashes or hallucinations unless otherwise stated above in HPI. ____________________________________________   PHYSICAL EXAM:  VITAL SIGNS: Vitals:   12/26/17 2039  BP: 125/85  Pulse: 94  Resp: 18  Temp: 98.5 F (36.9 C)  SpO2: 96%    Constitutional: Alert oriented to person, place and time but seems very distracted and confused looking about room./ Eyes: Conjunctivae are normal.  Head: Atraumatic. Nose: No congestion/rhinnorhea. Mouth/Throat: Mucous membranes are moist.   Neck: No stridor. Painless ROM.  Cardiovascular: Normal rate, regular rhythm. Grossly normal heart sounds.  Good peripheral circulation. Respiratory: Normal respiratory effort.  No retractions. Lungs CTAB. Gastrointestinal: Soft and nontender. No distention. No abdominal bruits. No CVA tenderness. Genitourinary:  Musculoskeletal: No lower extremity tenderness nor edema.  No joint effusions. Neurologic:  CN- intact.  No facial droop, Normal FNF.  Normal heel to shin.  Sensation intact bilaterally. Normal speech and language. No gross focal neurologic deficits are appreciated. No gait instability. Skin:  Skin is warm, dry and intact. No rash noted.   ____________________________________________   LABS (all labs ordered are listed, but only abnormal results are displayed)  Results for orders placed or performed during the hospital encounter of 12/26/17 (from the past 24 hour(s))  Glucose, capillary     Status: Abnormal   Collection Time: 12/26/17  8:35 PM  Result Value Ref Range   Glucose-Capillary 172 (H) 65 - 99 mg/dL  Blood gas, venous     Status: Abnormal (  Preliminary result)   Collection Time: 12/26/17  8:45 PM  Result Value Ref Range   FIO2 0.21    Delivery systems PENDING    pH, Ven 7.44 (H) 7.250 - 7.430   pCO2, Ven 36 (L) 44.0 - 60.0 mmHg   pO2, Ven 92.0 (H) 32.0 - 45.0 mmHg   Bicarbonate 24.5 20.0 - 28.0 mmol/L   Acid-Base Excess 0.6 0.0 - 2.0 mmol/L   O2  Saturation 97.4 %   Patient temperature 37.0    Collection site RIGHT RADIAL    Sample type ARTERIAL DRAW    ____________________________________________  EKG My review and personal interpretation at Time: 20:36   Indication: ams  Rate: 90  Rhythm: sinus Axis: normal Other: nonspecific st abn, borderline prolonged qt, no stemi ____________________________________________  RADIOLOGY  I personally reviewed all radiographic images ordered to evaluate for the above acute complaints and reviewed radiology reports and findings.  These findings were personally discussed with the patient.  Please see medical record for radiology report.  ____________________________________________   PROCEDURES  Procedure(s) performed:  Procedures    Critical Care performed: no ____________________________________________   INITIAL IMPRESSION / ASSESSMENT AND PLAN / ED COURSE  Pertinent labs & imaging results that were available during my care of the patient were reviewed by me and considered in my medical decision making (see chart for details).  DDX: Dehydration, sepsis, pna, uti, hypoglycemia, cva, drug effect, withdrawal, encephalitis   Aaron [name] is a 42 y.o. who presents to the ED with confusion and altered mental status as described above.  Patient in no acute distress moving all extremities.  Seems atypical for stroke but as I do not have further additional history will order CT imaging as well as blood work for the above differential and continue to observe patient.  As there is no known last well time did not get a meet criteria for code stroke.  Clinical Course as of Dec 26 2316  Tue Dec 26, 2017  2155 Patient currently talking on the phone with his family member was also admitted to another hospital.  Moving all extremities.  This does not seem clinically consistent with a stroke.  Possible substance ingestion.  Possible underlying psychiatric illness.  Blood work thus far is  reassuring.  We will continue to observe.  Does not seem to have any complaints at this time.   [PR]  2222 Encephalopathy seems to have cleared.  Do clear if he had some sort of heat illness as he does now endorse that he been out walking in the heat today.  States he might of gotten dehydrated.  Denies any pain.  No hallucinations.  No headache.  No dizziness.  Able to ambulate with steady gait.  Appears clinically improved after IVF.   [PR]  2244 Urine is positive for amphetamines which would explain the patient's encephalopathy that is currently improving.  At this point do believe he stable and appropriate for discharge.  Discussed importance of cessation of substances due to risks of strokes as well as heart disease.  Patient given referral for outpatient follow-up.  Have discussed with the patient and available family all diagnostics and treatments performed thus far and all questions were answered to the best of my ability. The patient demonstrates understanding and agreement with plan.    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    Willy Eddy, MD 12/26/17 [id number]

## 2017-12-26 NOTE — ED Notes (Signed)
Pt given meal tray at this time 

## 2017-12-26 NOTE — ED Triage Notes (Signed)
Pt arrived to the ED via EMS from [employer] parking lot. According to EMS the Pt  Felt tired and felt asleep in the front door of [employer]. Pt is AOx4 and states that he is really sleepy. Neurological assessment was conducted at bedside by Dr. Roxan Hockey.

## 2017-12-29 LAB — BLOOD GAS, VENOUS
Acid-Base Excess: 0.6 mmol/L (ref 0.0–2.0)
BICARBONATE: 24.5 mmol/L (ref 20.0–28.0)
FIO2: 0.21
O2 Saturation: 97.4 %
Patient temperature: 37
pCO2, Ven: 36 mmHg — ABNORMAL LOW (ref 44.0–60.0)
pH, Ven: 7.44 — ABNORMAL HIGH (ref 7.250–7.430)
pO2, Ven: 92 mmHg — ABNORMAL HIGH (ref 32.0–45.0)

## 2018-07-14 ENCOUNTER — Emergency Department
Admission: EM | Admit: 2018-07-14 | Discharge: 2018-07-14 | Disposition: A | Discharge status: Home / Self Care | Payer: Self-pay | Attending: Emergency Medicine | Admitting: Emergency Medicine

## 2018-07-14 ENCOUNTER — Other Ambulatory Visit: Payer: Self-pay

## 2018-07-14 ENCOUNTER — Encounter: Payer: Self-pay | Admitting: Emergency Medicine

## 2018-07-14 ENCOUNTER — Emergency Department: Payer: Self-pay

## 2018-07-14 DIAGNOSIS — Y999 Unspecified external cause status: Secondary | ICD-10-CM | POA: Insufficient documentation

## 2018-07-14 DIAGNOSIS — Z79899 Other long term (current) drug therapy: Secondary | ICD-10-CM | POA: Insufficient documentation

## 2018-07-14 DIAGNOSIS — X58XXXA Exposure to other specified factors, initial encounter: Secondary | ICD-10-CM | POA: Insufficient documentation

## 2018-07-14 DIAGNOSIS — S93401A Sprain of unspecified ligament of right ankle, initial encounter: Secondary | ICD-10-CM

## 2018-07-14 DIAGNOSIS — Y929 Unspecified place or not applicable: Secondary | ICD-10-CM | POA: Insufficient documentation

## 2018-07-14 DIAGNOSIS — I10 Essential (primary) hypertension: Secondary | ICD-10-CM | POA: Insufficient documentation

## 2018-07-14 DIAGNOSIS — E119 Type 2 diabetes mellitus without complications: Secondary | ICD-10-CM | POA: Insufficient documentation

## 2018-07-14 DIAGNOSIS — F17200 Nicotine dependence, unspecified, uncomplicated: Secondary | ICD-10-CM | POA: Insufficient documentation

## 2018-07-14 DIAGNOSIS — Y939 Activity, unspecified: Secondary | ICD-10-CM | POA: Insufficient documentation

## 2018-07-14 MED ORDER — NAPROXEN 500 MG PO TABS: 500.0000 mg | ORAL_TABLET | Freq: Two times a day (BID) | ORAL | 2 refills | Status: AC

## 2018-07-14 MED ORDER — TRAMADOL HCL 50 MG PO TABS: 50.0000 mg | ORAL_TABLET | Freq: Two times a day (BID) | ORAL | 0 refills | Status: DC | PRN

## 2018-07-14 NOTE — ED Notes (Signed)
Pt verbalized understanding of discharge instructions. NAD at this time. 

## 2018-07-14 NOTE — ED Triage Notes (Signed)
Twisted R ankle 3 days ago, painful.

## 2018-07-14 NOTE — ED Provider Notes (Signed)
[facility] Emergency Department Provider Note   ____________________________________________   First MD Initiated Contact with Patient 07/14/18 1325     (approximate)  I have reviewed the triage vital signs and the nursing notes.   HISTORY  Chief Complaint Ankle Pain    HPI Aaron [name] is a 42 y.o. male patient presents with 3 days of right ankle pain secondary to a twisting incident.  Patient did pain increased with ambulation.  Patient denies loss of function no loss of sensation.  Patient rates pain as a 10/10.  Patient described pain is "achy".  No palliative measure for complaint.  Past Medical History:  Diagnosis Date  . Asthma   . Diabetes mellitus without complication (HCC)   . Hypertension     Patient Active Problem List   Diagnosis Date Noted  . Angioedema 04/20/2016    No past surgical history on file.  Prior to Admission medications   Medication Sig Start Date End Date Taking? Authorizing Provider  hydrochlorothiazide (HYDRODIURIL) 25 MG tablet Take 1 tablet (25 mg total) by mouth daily. 11/19/17   Tommi RumpsSummers, Rhonda L, PA-C  indomethacin (INDOCIN) 25 MG capsule Take 1 capsule (25 mg total) by mouth 3 (three) times daily with meals. 11/19/17   Tommi RumpsSummers, Rhonda L, PA-C  naproxen (NAPROSYN) 500 MG tablet Take 1 tablet (500 mg total) by mouth 2 (two) times daily with a meal. 07/14/18 07/14/19  Joni ReiningSmith, Shyia Fillingim K, PA-C  tamsulosin (FLOMAX) 0.4 MG CAPS capsule Take 1 capsule (0.4 mg total) daily after supper by mouth. 06/13/17   Willy Eddyobinson, Patrick, MD  traMADol (ULTRAM) 50 MG tablet Take 1 tablet (50 mg total) by mouth every 12 (twelve) hours as needed. 07/14/18   Joni ReiningSmith, Queenie Aufiero K, PA-C    Allergies Levaquin [levofloxacin]  No family history on file.  Social History Social History   Tobacco Use  . Smoking status: Current Every Day Smoker  . Smokeless tobacco: Never Used  Substance Use Topics  . Alcohol use: Yes  . Drug use: No     Review of Systems Constitutional: No fever/chills Eyes: No visual changes. ENT: No sore throat. Cardiovascular: Denies chest pain. Respiratory: Denies shortness of breath. Gastrointestinal: No abdominal pain.  No nausea, no vomiting.  No diarrhea.  No constipation. Genitourinary: Negative for dysuria. Musculoskeletal: Negative for back pain. Skin: Negative for rash. Neurological: Negative for headaches, focal weakness or numbness. Endocrine:Diabetes and hypertension. Hematological/Lymphatic: Allergic/Immunilogical: Levaquin. ____________________________________________   PHYSICAL EXAM:  VITAL SIGNS: ED Triage Vitals  Enc Vitals Group     BP 07/14/18 1310 (!) 174/110     Pulse Rate 07/14/18 1310 (!) 104     Resp 07/14/18 1310 20     Temp 07/14/18 1310 99.1 F (37.3 C)     Temp Source 07/14/18 1310 Oral     SpO2 07/14/18 1310 100 %     Weight 07/14/18 1315 257 lb (116.6 kg)     Height 07/14/18 1315 6' (1.829 m)     Head Circumference --      Peak Flow --      Pain Score 07/14/18 1315 10     Pain Loc --      Pain Edu? --      Excl. in GC? --     Constitutional: Alert and oriented. Well appearing and in no acute distress. Cardiovascular: Normal rate, regular rhythm. Grossly normal heart sounds.  Good peripheral circulation.  Elevated blood pressure. Respiratory: Normal respiratory effort.  No retractions. Lungs  CTAB. Musculoskeletal: No obvious deformity to the right ankle.  Moderate edema to the lateral malleolus. Neurologic:  Normal speech and language. No gross focal neurologic deficits are appreciated. No gait instability. Skin:  Skin is warm, dry and intact. No rash noted. Psychiatric: Mood and affect are normal. Speech and behavior are normal.  ____________________________________________   LABS (all labs ordered are listed, but only abnormal results are displayed)  Labs Reviewed - No data to  display ____________________________________________  EKG   ____________________________________________  RADIOLOGY  ED MD interpretation:    Official radiology report(s): No results found.  ____________________________________________   PROCEDURES  Procedure(s) performed: None  .Splint Application Date/Time: 07/14/2018 2:27 PM Performed by: Candis Musa, NT Authorized by: Joni Reining, PA-C   Consent:    Consent obtained:  Verbal   Consent given by:  Patient   Risks discussed:  Numbness, pain and swelling Pre-procedure details:    Sensation:  Normal Procedure details:    Laterality:  Right   Location:  Ankle   Ankle:  R ankle   Strapping: no     Splint type:  Ankle stirrup   Supplies:  Prefabricated splint Post-procedure details:    Pain:  Unchanged   Sensation:  Normal   Patient tolerance of procedure:  Tolerated well, no immediate complications    Critical Care performed: No  ____________________________________________   INITIAL IMPRESSION / ASSESSMENT AND PLAN / ED COURSE  As part of my medical decision making, I reviewed the following data within the electronic MEDICAL RECORD NUMBER    Right ankle pain and edema secondary to sprain.  Patient placed in ankle stirrup splint and given discharge care instruction.  Patient was take medication as needed for pain.  Patient [facility]      ____________________________________________   FINAL CLINICAL IMPRESSION(S) / ED DIAGNOSES  Final diagnoses:  Sprain of right ankle, unspecified ligament, initial encounter     ED Discharge Orders         Ordered    naproxen (NAPROSYN) 500 MG tablet  2 times daily with meals     07/14/18 1422    traMADol (ULTRAM) 50 MG tablet  Every 12 hours PRN     07/14/18 1422           Note:  This document was prepared using Dragon voice recognition software and may include unintentional dictation errors.    Joni Reining, PA-C 07/14/18 1428    Schaevitz, Myra Rude, MD 07/14/18 [phone]

## 2018-07-21 ENCOUNTER — Emergency Department
Admission: EM | Admit: 2018-07-21 | Discharge: 2018-07-21 | Disposition: A | Discharge status: Home / Self Care | Payer: Self-pay | Attending: Emergency Medicine | Admitting: Emergency Medicine

## 2018-07-21 ENCOUNTER — Other Ambulatory Visit: Payer: Self-pay

## 2018-07-21 ENCOUNTER — Encounter: Payer: Self-pay | Admitting: Emergency Medicine

## 2018-07-21 DIAGNOSIS — J45909 Unspecified asthma, uncomplicated: Secondary | ICD-10-CM | POA: Insufficient documentation

## 2018-07-21 DIAGNOSIS — Y9389 Activity, other specified: Secondary | ICD-10-CM | POA: Insufficient documentation

## 2018-07-21 DIAGNOSIS — X501XXD Overexertion from prolonged static or awkward postures, subsequent encounter: Secondary | ICD-10-CM | POA: Insufficient documentation

## 2018-07-21 DIAGNOSIS — Z79899 Other long term (current) drug therapy: Secondary | ICD-10-CM | POA: Insufficient documentation

## 2018-07-21 DIAGNOSIS — F17200 Nicotine dependence, unspecified, uncomplicated: Secondary | ICD-10-CM | POA: Insufficient documentation

## 2018-07-21 DIAGNOSIS — I10 Essential (primary) hypertension: Secondary | ICD-10-CM | POA: Insufficient documentation

## 2018-07-21 DIAGNOSIS — S93401D Sprain of unspecified ligament of right ankle, subsequent encounter: Secondary | ICD-10-CM | POA: Insufficient documentation

## 2018-07-21 DIAGNOSIS — E119 Type 2 diabetes mellitus without complications: Secondary | ICD-10-CM | POA: Insufficient documentation

## 2018-07-21 MED ORDER — MELOXICAM 15 MG PO TABS: 15.0000 mg | ORAL_TABLET | Freq: Every day | ORAL | 0 refills | Status: DC

## 2018-07-21 MED ORDER — TRAMADOL HCL 50 MG PO TABS: 50.0000 mg | ORAL_TABLET | Freq: Four times a day (QID) | ORAL | 0 refills | Status: DC | PRN

## 2018-07-21 NOTE — ED Triage Notes (Addendum)
States R ankle pain x 1 1/2 weeks. States was seen previously and xray done. States is still painful. Regarding blood pressure noted in triage, states knows he is supposed to be taking blood pressure medication but that he does not take it.

## 2018-07-21 NOTE — ED Provider Notes (Signed)
[facility] Emergency Department Provider Note  ____________________________________________  Time seen: Approximately 3:59 PM  I have reviewed the triage vital signs and the nursing notes.   HISTORY  Chief Complaint Ankle Pain    HPI Aaron [name] is a 42 y.o. male who presents emergency department complaining of ongoing right ankle pain.  Patient was seen in this department  1 week prior after twisting his ankle.  Patient had a negative x-ray at that time, was given an ankle splint, Naprosyn and Ultram for symptom relief.  Patient reports that symptoms have not improved, if anything they worsen as far as pain.  Patient reports that the pain radiates from his ankle into his lower leg.  No gross edema.  No gross ecchymosis.  Patient reports that he was not given crutches and can barely bear weight.  Patient is taking his medication as prescribed with no relief.  He has not followed up with orthopedics.  No new injury or complaint.  Patient does have a history of gout but states that symptoms do not feel like his previous gout flares.  Patient does have a history of asthma, diabetes, hypertension.  Patient is hypertensive at this point, states that he supposed to take HCTZ, but has not been doing so.  Patient denies any headache, chest pain, shortness of breath abdominal pain, nausea vomiting.   Past Medical History:  Diagnosis Date  . Asthma   . Diabetes mellitus without complication (HCC)   . Hypertension     Patient Active Problem List   Diagnosis Date Noted  . 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 No pertinent surgical history.  Prior to Admission medications   Medication Sig Start Date End Date Taking? Authorizing Provider  hydrochlorothiazide (HYDRODIURIL) 25 MG tablet Take 1 tablet (25 mg total) by mouth daily. 11/19/17   Tommi Rumps, PA-C  indomethacin (INDOCIN) 25 MG capsule Take 1 capsule (25 mg total) by mouth 3 (three) times daily  with meals. 11/19/17   Tommi Rumps, PA-C  meloxicam (MOBIC) 15 MG tablet Take 1 tablet (15 mg total) by mouth daily. 07/21/18   Daquon Greenleaf, Delorise Royals, PA-C  naproxen (NAPROSYN) 500 MG tablet Take 1 tablet (500 mg total) by mouth 2 (two) times daily with a meal. 07/14/18 07/14/19  Joni Reining, PA-C  tamsulosin (FLOMAX) 0.4 MG CAPS capsule Take 1 capsule (0.4 mg total) daily after supper by mouth. 06/13/17   Willy Eddy, MD  traMADol (ULTRAM) 50 MG tablet Take 1 tablet (50 mg total) by mouth every 6 (six) hours as needed. 07/21/18   Savhanna Sliva, Delorise Royals, PA-C    Allergies Levaquin [levofloxacin]  No family history on file.  Social History Social History   Tobacco Use  . Smoking status: Current Every Day Smoker  . Smokeless tobacco: Never Used  Substance Use Topics  . Alcohol use: Yes  . Drug use: No     Review of Systems  Constitutional: No fever/chills Eyes: No visual changes. No discharge ENT: No upper respiratory complaints. Cardiovascular: no chest pain. Respiratory: no cough. No SOB. Gastrointestinal: No abdominal pain.  No nausea, no vomiting.  Musculoskeletal: Right ankle pain. Skin: Negative for rash, abrasions, lacerations, ecchymosis. Neurological: Negative for headaches, focal weakness or numbness. 10-point ROS otherwise negative.  ____________________________________________   PHYSICAL EXAM:  VITAL SIGNS: ED Triage Vitals  Enc Vitals Group     BP 07/21/18 1410 (!) 167/104     Pulse Rate 07/21/18 1410 96  Resp 07/21/18 1410 18     Temp 07/21/18 1410 98.2 F (36.8 C)     Temp Source 07/21/18 1410 Oral     SpO2 07/21/18 1410 100 %     Weight 07/21/18 1411 260 lb (117.9 kg)     Height 07/21/18 1411 6' (1.829 m)     Head Circumference --      Peak Flow --      Pain Score 07/21/18 1411 10     Pain Loc --      Pain Edu? --      Excl. in GC? --      Constitutional: Alert and oriented. Well appearing and in no acute distress. Eyes:  Conjunctivae are normal. PERRL. EOMI. Head: Atraumatic.  Neck: No stridor.    Cardiovascular: Normal rate, regular rhythm. Normal S1 and S2.  Good peripheral circulation. Respiratory: Normal respiratory effort without tachypnea or retractions. Lungs CTAB. Good air entry to the bases with no decreased or absent breath sounds. Musculoskeletal: Full range of motion to all extremities. No gross deformities appreciated.  Visualization of the right ankle reveals no gross edema, erythema, ecchymosis, deformity.  Patient has good range of motion to the ankle joint at this time.  He is very tender to palpation over the medial joint line, medial malleolus, Achilles tendon.  No deficits.  Patient is able to flex, extend the ankle joint appropriately.  Dorsalis pedis pulse intact.  Sensation intact in all dermatomal distributions.  Examination of the left Neurologic:  Normal speech and language. No gross focal neurologic deficits are appreciated.  Skin:  Skin is warm, dry and intact. No rash noted. Psychiatric: Mood and affect are normal. Speech and behavior are normal. Patient exhibits appropriate insight and judgement.   ____________________________________________   LABS (all labs ordered are listed, but only abnormal results are displayed)  Labs Reviewed - No data to display ____________________________________________  EKG   ____________________________________________  RADIOLOGY  I personally reviewed imaging from 07/14/2018 in my decision-making.  I concur with radiologist finding of no acute osseous abnormality to the right ankle.  No results found.  ____________________________________________    PROCEDURES  Procedure(s) performed:    Procedures    Medications - No data to display   ____________________________________________   INITIAL IMPRESSION / ASSESSMENT AND PLAN / ED COURSE  Pertinent labs & imaging results that were available during my care of the patient were  reviewed by me and considered in my medical decision making (see chart for details).  Review of the [place] CSRS was performed in accordance of the [facility] prior to dispensing any controlled drugs.      Patient's diagnosis is consistent with right ankle sprain, hypertension.  Patient presents the emergency department complaining of ongoing right ankle pain.  He was originally seen in this department 1 week ago, negative x-ray.  I reviewed x-ray again today, no indication of acute fracture or dislocation.  Patient complains of ongoing pain.  I will give patient a set of crutches for ambulation.  Refill his anti-inflammatory and give a limited prescription of Ultram.  He is to follow-up at this time with orthopedics.  Differential included repeat injury, undiagnosed fracture, ligament rupture, DVT, gout, septic joint.  At this time, no indication of infectious process.  No gross indication of acute ligamentous rupture.  On exam, no indication of DVT.  I suspect ongoing symptoms from ankle sprain.  Follow-up with orthopedics..  Patient is given ED precautions to return to the ED for any worsening  or new symptoms.     ____________________________________________  FINAL CLINICAL IMPRESSION(S) / ED DIAGNOSES  Final diagnoses:  Sprain of right ankle, unspecified ligament, subsequent encounter      NEW MEDICATIONS STARTED DURING THIS VISIT:  ED Discharge Orders         Ordered    meloxicam (MOBIC) 15 MG tablet  Daily     07/21/18 1613    traMADol (ULTRAM) 50 MG tablet  Every 6 hours PRN     07/21/18 1613              This chart was dictated using voice recognition software/Dragon. Despite best efforts to proofread, errors can occur which can change the meaning. Any change was purely unintentional.    Racheal PatchesCuthriell, Juanetta Negash D, PA-C 07/21/18 1615    Jene EveryKinner, Robert, MD 07/21/18 [phone]

## 2019-06-27 ENCOUNTER — Other Ambulatory Visit: Payer: Self-pay

## 2019-06-27 ENCOUNTER — Emergency Department: Payer: Medicaid Other

## 2019-06-27 ENCOUNTER — Emergency Department
Admission: EM | Admit: 2019-06-27 | Discharge: 2019-06-27 | Disposition: A | Discharge status: Home / Self Care | Payer: Medicaid Other | Attending: Emergency Medicine | Admitting: Emergency Medicine

## 2019-06-27 ENCOUNTER — Encounter: Payer: Self-pay | Admitting: Emergency Medicine

## 2019-06-27 DIAGNOSIS — I10 Essential (primary) hypertension: Secondary | ICD-10-CM | POA: Insufficient documentation

## 2019-06-27 DIAGNOSIS — Z79899 Other long term (current) drug therapy: Secondary | ICD-10-CM | POA: Insufficient documentation

## 2019-06-27 DIAGNOSIS — J45909 Unspecified asthma, uncomplicated: Secondary | ICD-10-CM | POA: Insufficient documentation

## 2019-06-27 DIAGNOSIS — E876 Hypokalemia: Secondary | ICD-10-CM | POA: Insufficient documentation

## 2019-06-27 DIAGNOSIS — R531 Weakness: Secondary | ICD-10-CM | POA: Insufficient documentation

## 2019-06-27 DIAGNOSIS — Z791 Long term (current) use of non-steroidal anti-inflammatories (NSAID): Secondary | ICD-10-CM | POA: Insufficient documentation

## 2019-06-27 DIAGNOSIS — E119 Type 2 diabetes mellitus without complications: Secondary | ICD-10-CM | POA: Insufficient documentation

## 2019-06-27 DIAGNOSIS — F17210 Nicotine dependence, cigarettes, uncomplicated: Secondary | ICD-10-CM | POA: Insufficient documentation

## 2019-06-27 LAB — TROPONIN I (HIGH SENSITIVITY)
Troponin I (High Sensitivity): 12 ng/L (ref <18)
Troponin I (High Sensitivity): 10 ng/L (ref <18)

## 2019-06-27 LAB — COMPREHENSIVE METABOLIC PANEL
ALT: 43 U/L (ref 0–44)
AST: 63 U/L — ABNORMAL HIGH (ref 15–41)
Albumin: 4.6 g/dL (ref 3.5–5.0)
Alkaline Phosphatase: 26 U/L — ABNORMAL LOW (ref 38–126)
Anion gap: 11 (ref 5–15)
BUN: 19 mg/dL (ref 6–20)
CO2: 26 mmol/L (ref 22–32)
Calcium: 9.1 mg/dL (ref 8.9–10.3)
Chloride: 100 mmol/L (ref 98–111)
Creatinine, Ser: 1.26 mg/dL — ABNORMAL HIGH (ref 0.61–1.24)
GFR calc Af Amer: >60 mL/min (ref >60)
GFR calc non Af Amer: >60 mL/min (ref >60)
Glucose, Bld: 75 mg/dL (ref 70–99)
Potassium: 2.7 mmol/L — CL (ref 3.5–5.1)
Sodium: 137 mmol/L (ref 135–145)
Total Bilirubin: 1.4 mg/dL — ABNORMAL HIGH (ref 0.3–1.2)
Total Protein: 8.4 g/dL — ABNORMAL HIGH (ref 6.5–8.1)

## 2019-06-27 LAB — URINALYSIS, COMPLETE (UACMP) WITH MICROSCOPIC
Bacteria, UA: NONE SEEN
Bilirubin Urine: NEGATIVE
Glucose, UA: NEGATIVE mg/dL
Ketones, ur: NEGATIVE mg/dL
Leukocytes,Ua: NEGATIVE
Nitrite: NEGATIVE
Protein, ur: 30 mg/dL — AB
RBC / HPF: >50 RBC/hpf — ABNORMAL HIGH (ref 0–5)
Specific Gravity, Urine: 1.021 (ref 1.005–1.030)
pH: 5 (ref 5.0–8.0)

## 2019-06-27 LAB — CBC
HCT: 44.7 % (ref 39.0–52.0)
Hemoglobin: 15.3 g/dL (ref 13.0–17.0)
MCH: 29.6 pg (ref 26.0–34.0)
MCHC: 34.2 g/dL (ref 30.0–36.0)
MCV: 86.5 fL (ref 80.0–100.0)
Platelets: 282 10*3/uL (ref 150–400)
RBC: 5.17 MIL/uL (ref 4.22–5.81)
RDW: 14.3 % (ref 11.5–15.5)
WBC: 8.7 10*3/uL (ref 4.0–10.5)
nRBC: 0 % (ref 0.0–0.2)

## 2019-06-27 LAB — GLUCOSE, CAPILLARY: Glucose-Capillary: 89 mg/dL (ref 70–99)

## 2019-06-27 LAB — MAGNESIUM: Magnesium: 2.6 mg/dL — ABNORMAL HIGH (ref 1.7–2.4)

## 2019-06-27 MED ORDER — POTASSIUM CHLORIDE 20 MEQ PO PACK
40.0000 meq | PACK | Freq: Once | ORAL | Status: AC
  Administered 2019-06-27: 40 meq via ORAL
  Filled 2019-06-27: qty 2

## 2019-06-27 MED ORDER — AMLODIPINE BESYLATE 5 MG PO TABS: 5.0000 mg | ORAL_TABLET | Freq: Every day | ORAL | 0 refills | Status: DC

## 2019-06-27 MED ORDER — SODIUM CHLORIDE 0.9 % IV BOLUS
500.0000 mL | Freq: Once | INTRAVENOUS | Status: AC
  Administered 2019-06-27: 500 mL via INTRAVENOUS

## 2019-06-27 MED ORDER — POTASSIUM CHLORIDE 10 MEQ/100ML IV SOLN
10.0000 meq | Freq: Once | INTRAVENOUS | Status: AC
  Administered 2019-06-27: 10 meq via INTRAVENOUS
  Filled 2019-06-27: qty 100

## 2019-06-27 MED ORDER — PREDNISONE 20 MG PO TABS
60.0000 mg | ORAL_TABLET | Freq: Once | ORAL | Status: AC
  Administered 2019-06-27: 60 mg via ORAL
  Filled 2019-06-27: qty 3

## 2019-06-27 MED ORDER — POTASSIUM CHLORIDE ER 10 MEQ PO TBCR: 20.0000 meq | EXTENDED_RELEASE_TABLET | Freq: Two times a day (BID) | ORAL | 0 refills | Status: DC

## 2019-06-27 MED ORDER — SODIUM CHLORIDE 0.9% FLUSH
3.0000 mL | Freq: Once | INTRAVENOUS | Status: AC
  Administered 2019-06-27: 3 mL via INTRAVENOUS

## 2019-06-27 MED ORDER — PREDNISONE 20 MG PO TABS: 40.0000 mg | ORAL_TABLET | Freq: Every day | ORAL | 0 refills | Status: AC

## 2019-06-27 MED ORDER — LACTATED RINGERS IV BOLUS: 1000.0000 mL | Freq: Once | INTRAVENOUS | Status: DC

## 2019-06-27 MED ORDER — MAGNESIUM SULFATE 2 GM/50ML IV SOLN: 2.0000 g | Freq: Once | INTRAVENOUS | Status: DC

## 2019-06-27 MED ORDER — ALBUTEROL SULFATE HFA 108 (90 BASE) MCG/ACT IN AERS: 2.0000 | INHALATION_SPRAY | Freq: Four times a day (QID) | RESPIRATORY_TRACT | 0 refills | Status: DC | PRN

## 2019-06-27 NOTE — ED Triage Notes (Signed)
Pt comes into the ED via EMS from the nights [facility], c/o weakness and nausea, CBG 90, 100% RA.

## 2019-06-27 NOTE — ED Notes (Signed)
Repeat EKG done post K and fluid infusion per EDP

## 2019-06-27 NOTE — ED Notes (Signed)
Pt states wife is picking him up, walked to lobby, gait steady. Pt encouraged to eat healthy diet.

## 2019-06-27 NOTE — ED Provider Notes (Signed)
[facility] Emergency Department Provider Note  ____________________________________________   First MD Initiated Contact with Patient 06/27/19 1315     (approximate)  I have reviewed the triage vital signs and the nursing notes.   HISTORY  Chief Complaint Shortness of Breath and Weakness    HPI Aaron [name] is a 43 y.o. male  With h/o HTN, DM, asthma here with transient SOB. Pt states he was outside of his hotel today, getting ready to leave. He was smoking at the time. He states he has had a mild cough for a few days then began to feel SOB. He states he began wheezing. He felt mildly nauseous after coughing up some phlegm. He then called EMS for evaluation. Reports he now feels better. He admits he has not been taking any of his meds, particularly his COPD meds. He has had mild increased cough prior to this. No current CP, SOB, palpitations. No abd pain, n/v/d. He admits to poor diet lately. No other complaints.       Past Medical History:  Diagnosis Date  . Asthma   . Diabetes mellitus without complication (HCC)   . Hypertension     Patient Active Problem List   Diagnosis Date Noted  . 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 No pertinent surgical history.  Prior to Admission medications   Medication Sig Start Date End Date Taking? Authorizing Provider  albuterol (VENTOLIN HFA) 108 (90 Base) MCG/ACT inhaler Inhale 2 puffs into the lungs every 6 (six) hours as needed for wheezing or shortness of breath. 06/27/19   Shaune PollackIsaacs, Kallan Merrick, MD  amLODipine (NORVASC) 5 MG tablet Take 1 tablet (5 mg total) by mouth daily. 06/27/19 07/27/19  Shaune PollackIsaacs, Gwenn Teodoro, MD  indomethacin (INDOCIN) 25 MG capsule Take 1 capsule (25 mg total) by mouth 3 (three) times daily with meals. 11/19/17   Tommi RumpsSummers, Rhonda L, PA-C  meloxicam (MOBIC) 15 MG tablet Take 1 tablet (15 mg total) by mouth daily. 07/21/18   Cuthriell, Delorise RoyalsJonathan D, PA-C  naproxen (NAPROSYN) 500 MG tablet  Take 1 tablet (500 mg total) by mouth 2 (two) times daily with a meal. 07/14/18 07/14/19  Joni ReiningSmith, Ronald K, PA-C  potassium chloride (KLOR-CON) 10 MEQ tablet Take 2 tablets (20 mEq total) by mouth 2 (two) times daily for 7 days. 06/27/19 07/04/19  Shaune PollackIsaacs, Felesha Moncrieffe, MD  predniSONE (DELTASONE) 20 MG tablet Take 2 tablets (40 mg total) by mouth daily for 5 days. 06/27/19 07/02/19  Shaune PollackIsaacs, Reon Hunley, MD  tamsulosin (FLOMAX) 0.4 MG CAPS capsule Take 1 capsule (0.4 mg total) daily after supper by mouth. 06/13/17   Willy Eddyobinson, Patrick, MD  traMADol (ULTRAM) 50 MG tablet Take 1 tablet (50 mg total) by mouth every 6 (six) hours as needed. 07/21/18   Cuthriell, Delorise RoyalsJonathan D, PA-C  hydrochlorothiazide (HYDRODIURIL) 25 MG tablet Take 1 tablet (25 mg total) by mouth daily. 11/19/17 06/27/19  Tommi RumpsSummers, Rhonda L, PA-C    Allergies Levaquin [levofloxacin]  History reviewed. No pertinent family history.  Social History Social History   Tobacco Use  . Smoking status: Current Every Day Smoker    Types: Cigarettes  . Smokeless tobacco: Never Used  Substance Use Topics  . Alcohol use: Yes  . Drug use: No    Review of Systems  Review of Systems  Constitutional: Positive for fatigue. Negative for chills and fever.  HENT: Negative for sore throat.   Respiratory: Positive for cough, shortness of breath and wheezing.   Cardiovascular: Negative for chest pain.  Gastrointestinal: Positive for nausea (transient, resolved). Negative for abdominal pain.  Genitourinary: Negative for flank pain.  Musculoskeletal: Negative for neck pain.  Skin: Negative for rash and wound.  Allergic/Immunologic: Negative for immunocompromised state.  Neurological: Negative for weakness and numbness.  Hematological: Does not bruise/bleed easily.  All other systems reviewed and are negative.    ____________________________________________  PHYSICAL EXAM:      VITAL SIGNS: ED Triage Vitals  Enc Vitals Group     BP 06/27/19 1216 (!)  146/81     Pulse Rate 06/27/19 1216 97     Resp 06/27/19 1216 (!) 22     Temp 06/27/19 1216 98.9 F (37.2 C)     Temp Source 06/27/19 1216 Oral     SpO2 06/27/19 1216 97 %     Weight 06/27/19 1218 267 lb (121.1 kg)     Height 06/27/19 1218 6' (1.829 m)     Head Circumference --      Peak Flow --      Pain Score 06/27/19 1216 0     Pain Loc --      Pain Edu? --      Excl. in GC? --      Physical Exam Vitals signs and nursing note reviewed.  Constitutional:      General: He is not in acute distress.    Appearance: He is well-developed.  HENT:     Head: Normocephalic and atraumatic.  Eyes:     Conjunctiva/sclera: Conjunctivae normal.  Neck:     Musculoskeletal: Neck supple.  Cardiovascular:     Rate and Rhythm: Normal rate and regular rhythm.     Heart sounds: Normal heart sounds. No murmur. No friction rub.  Pulmonary:     Effort: Pulmonary effort is normal. No respiratory distress.     Breath sounds: Wheezing (scant, expiratory) present. No rales.     Comments: Normal WOB, speaking in full sentences Abdominal:     General: There is no distension.     Palpations: Abdomen is soft.     Tenderness: There is no abdominal tenderness.  Skin:    General: Skin is warm.     Capillary Refill: Capillary refill takes less than 2 seconds.  Neurological:     Mental Status: He is alert and oriented to person, place, and time.     Motor: No abnormal muscle tone.       ____________________________________________   LABS (all labs ordered are listed, but only abnormal results are displayed)  Labs Reviewed  URINALYSIS, COMPLETE (UACMP) WITH MICROSCOPIC - Abnormal; Notable for the following components:      Result Value   Color, Urine YELLOW (*)    APPearance HAZY (*)    Hgb urine dipstick LARGE (*)    Protein, ur 30 (*)    RBC / HPF >50 (*)    All other components within normal limits  COMPREHENSIVE METABOLIC PANEL - Abnormal; Notable for the following components:    Potassium 2.7 (*)    Creatinine, Ser 1.26 (*)    Total Protein 8.4 (*)    AST 63 (*)    Alkaline Phosphatase 26 (*)    Total Bilirubin 1.4 (*)    All other components within normal limits  MAGNESIUM - Abnormal; Notable for the following components:   Magnesium 2.6 (*)    All other components within normal limits  GC/CHLAMYDIA PROBE AMP  CBC  GLUCOSE, CAPILLARY  CBG MONITORING, ED  TROPONIN I (HIGH SENSITIVITY)  TROPONIN I (  HIGH SENSITIVITY)    ____________________________________________  EKG: Normal sinus rhythm, VR 100. PR 146, QRS 106, QTc 554. Since last EKG, QT remains prolonged. Otherwise no significant changes. ________________________________________  RADIOLOGY All imaging, including plain films, CT scans, and ultrasounds, independently reviewed by me, and interpretations confirmed via formal radiology reads.  ED MD interpretation:   CXR: Clear, no acute abnormality  Official radiology report(s): Dg Chest 2 View  Result Date: 06/27/2019 CLINICAL DATA:  Shortness of breath EXAM: CHEST - 2 VIEW COMPARISON:  12/26/2017 FINDINGS: The heart size and mediastinal contours are within normal limits. Both lungs are clear. The visualized skeletal structures are unremarkable. IMPRESSION: No acute abnormality of the lungs. Electronically Signed   By: Lauralyn Primes M.D.   On: 06/27/2019 12:55    ____________________________________________  PROCEDURES   Procedure(s) performed (including Critical Care):  Procedures  ____________________________________________  INITIAL IMPRESSION / MDM / ASSESSMENT AND PLAN / ED COURSE  As part of my medical decision making, I reviewed the following data within the electronic MEDICAL RECORD NUMBER Nursing notes reviewed and incorporated, Old chart reviewed, Notes from prior ED visits, and [place] Controlled Substance Database       *CATHAN GEARIN was evaluated in Emergency Department on 06/27/2019 for the symptoms described in the history of  present illness. He was evaluated in the context of the global COVID-19 pandemic, which necessitated consideration that the patient might be at risk for infection with the SARS-CoV-2 virus that causes COVID-19. Institutional protocols and algorithms that pertain to the evaluation of patients at risk for COVID-19 are in a state of rapid change based on information released by regulatory bodies including the CDC and federal and state organizations. These policies and algorithms were followed during the patient's care in the ED.  Some ED evaluations and interventions may be delayed as a result of limited staffing during the pandemic.*  Clinical Course as of Jun 26 1724  Thu Jun 27, 2019  1538 43 yo M here with reported transient SOB while smoking. H/o COPD, Incidentally, pt noted to have hypoK which seems to be an ongoing issue for him, likely 2/2 substance use and poor diet. He was given IV and PO K here. Otherwise, sx improved spontnaeously after he stopped smoking. He does have some mild wheezing on exam, and suspect he may have some mild COPD exacerbation. No abd pain or signs of abd pathology. No neuro deficits. He does have mildly prolonged QT but this is chronic, not acutely changed, and he has no CP, palpitations, or sx to suggest arrhythmia. Will have him start K replacement, prednisone, low dose alb given his hypoK, and f/u with PCP. Will hold his HCTZ at home.   [CI]  1645 Repeat trop neg. Pt feels improved and is without complaints.   [CI]  1649 Ambulatory in ED w/o difficulty.   [CI]  1653 Repeat EKG shows QTC 46, which is actually improved from his baseline.  He feels improved.  Will treat for possible mild COPD exacerbation, hypokalemia, and discharge.   [CI]    Clinical Course User Index [CI] Shaune Pollack, MD    Medical Decision Making: As above  ____________________________________________  FINAL CLINICAL IMPRESSION(S) / ED DIAGNOSES  Final diagnoses:  Hypokalemia      MEDICATIONS GIVEN DURING THIS VISIT:  Medications  sodium chloride flush (NS) 0.9 % injection 3 mL (3 mLs Intravenous Given 06/27/19 1423)  potassium chloride 10 mEq in 100 mL IVPB (0 mEq Intravenous Stopped 06/27/19 1641)  potassium  chloride (KLOR-CON) packet 40 mEq (40 mEq Oral Given 06/27/19 1410)  sodium chloride 0.9 % bolus 500 mL (0 mLs Intravenous Stopped 06/27/19 1645)  predniSONE (DELTASONE) tablet 60 mg (60 mg Oral Given 06/27/19 1646)     ED Discharge Orders         Ordered    potassium chloride (KLOR-CON) 10 MEQ tablet  2 times daily     06/27/19 1709    predniSONE (DELTASONE) 20 MG tablet  Daily     06/27/19 1709    albuterol (VENTOLIN HFA) 108 (90 Base) MCG/ACT inhaler  Every 6 hours PRN     06/27/19 1709    amLODipine (NORVASC) 5 MG tablet  Daily     06/27/19 1709           Note:  This document was prepared using Dragon voice recognition software and may include unintentional dictation errors.   Duffy Bruce, MD 06/27/19 1725

## 2019-06-27 NOTE — ED Triage Notes (Signed)
Pt presents to ED via [facility] from a hotel with c/o sudden onset SOB, weakness and nausea that started when he was going to leave the hotel. Pt is A&O x 4 during triage, mildly tachypneic but otherwise in NAD.

## 2019-06-27 NOTE — ED Notes (Signed)
bg 89

## 2019-06-27 NOTE — ED Notes (Signed)
Pt denies pain, states some SOB on exertion. Pt seems comfortable, slightly SOB with exertion.

## 2019-06-27 NOTE — ED Notes (Signed)
Pt may eat and drink per EDP. Pt given meal tray and drink, tolerated well.

## 2019-12-01 ENCOUNTER — Other Ambulatory Visit: Payer: Self-pay

## 2019-12-01 ENCOUNTER — Emergency Department
Admission: EM | Admit: 2019-12-01 | Discharge: 2019-12-01 | Disposition: A | Discharge status: Home / Self Care | Payer: Medicaid Other | Attending: Emergency Medicine | Admitting: Emergency Medicine

## 2019-12-01 ENCOUNTER — Encounter: Payer: Self-pay | Admitting: Emergency Medicine

## 2019-12-01 ENCOUNTER — Emergency Department: Payer: Medicaid Other

## 2019-12-01 DIAGNOSIS — E119 Type 2 diabetes mellitus without complications: Secondary | ICD-10-CM | POA: Insufficient documentation

## 2019-12-01 DIAGNOSIS — I10 Essential (primary) hypertension: Secondary | ICD-10-CM | POA: Insufficient documentation

## 2019-12-01 DIAGNOSIS — J40 Bronchitis, not specified as acute or chronic: Secondary | ICD-10-CM | POA: Insufficient documentation

## 2019-12-01 DIAGNOSIS — F17210 Nicotine dependence, cigarettes, uncomplicated: Secondary | ICD-10-CM | POA: Insufficient documentation

## 2019-12-01 LAB — BASIC METABOLIC PANEL
Anion gap: 5 (ref 5–15)
BUN: 16 mg/dL (ref 6–20)
CO2: 30 mmol/L (ref 22–32)
Calcium: 9.2 mg/dL (ref 8.9–10.3)
Chloride: 103 mmol/L (ref 98–111)
Creatinine, Ser: 1.03 mg/dL (ref 0.61–1.24)
GFR calc Af Amer: >60 mL/min (ref >60)
GFR calc non Af Amer: >60 mL/min (ref >60)
Glucose, Bld: 125 mg/dL — ABNORMAL HIGH (ref 70–99)
Potassium: 3.8 mmol/L (ref 3.5–5.1)
Sodium: 138 mmol/L (ref 135–145)

## 2019-12-01 LAB — CBC
HCT: 45 % (ref 39.0–52.0)
Hemoglobin: 15 g/dL (ref 13.0–17.0)
MCH: 29.4 pg (ref 26.0–34.0)
MCHC: 33.3 g/dL (ref 30.0–36.0)
MCV: 88.2 fL (ref 80.0–100.0)
Platelets: 292 10*3/uL (ref 150–400)
RBC: 5.1 MIL/uL (ref 4.22–5.81)
RDW: 14.9 % (ref 11.5–15.5)
WBC: 8.1 10*3/uL (ref 4.0–10.5)
nRBC: 0 % (ref 0.0–0.2)

## 2019-12-01 LAB — TROPONIN I (HIGH SENSITIVITY): Troponin I (High Sensitivity): 7 ng/L (ref <18)

## 2019-12-01 MED ORDER — ALBUTEROL SULFATE HFA 108 (90 BASE) MCG/ACT IN AERS: 2.0000 | INHALATION_SPRAY | RESPIRATORY_TRACT | 0 refills | Status: DC | PRN

## 2019-12-01 NOTE — ED Triage Notes (Signed)
Here for [facility] and some chest pain.  Reports clear productive cough. Is a smoker.  Sx X 2 days.  No fevers. VSS.  Reports does feel congested. Unlabored currently.

## 2019-12-01 NOTE — Discharge Instructions (Signed)
Please call and schedule a follow up with primary care.  Return to the ER for symptoms that change or worsen if unable to schedule an appointment. 

## 2019-12-01 NOTE — ED Notes (Signed)
Walked into pt room to answer call light approximately two minutes after the light first went off. This EDT when entering room witnessed this pt urinating in the trash can. When this EDT asked the pt what he was doing the pt stated "I couldn't hold it anymore and these wires are on me," this EDT educated this pt on the location of the commode located in the room.

## 2019-12-01 NOTE — ED Provider Notes (Signed)
[facility] Emergency Department Provider Note ____________________________________________   First MD Initiated Contact with Patient 12/01/19 1426     (approximate)  I have reviewed the triage vital signs and the nursing notes.   HISTORY  Chief Complaint Shortness of Breath  HPI Aaron [name] is a 44 y.o. male with a history of asthma, diabetes, hypertension presents to the emergency department for treatment and evaluation of chest pain shortness of breath.  Symptoms started 2 days ago.  He has a cough productive of clear sputum.  He does smoke approximately a pack a day.  He feels congested.  No alleviating measures attempted prior to arrival.         Past Medical History:  Diagnosis Date  . Asthma   . Diabetes mellitus without complication ([place])   . Hypertension     Patient Active Problem List   Diagnosis Date Noted  . 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 No pertinent surgical history.  Prior to Admission medications   Medication Sig Start Date End Date Taking? Authorizing Provider  albuterol (VENTOLIN HFA) 108 (90 Base) MCG/ACT inhaler Inhale 2 puffs into the lungs every 4 (four) hours as needed for wheezing or shortness of breath. 12/01/19   Estee Yohe B, FNP  hydrochlorothiazide (HYDRODIURIL) 25 MG tablet Take 1 tablet (25 mg total) by mouth daily. 11/19/17 06/27/19  Johnn Hai, PA-C    Allergies Levaquin [levofloxacin]  History reviewed. No pertinent family history.  Social History Social History   Tobacco Use  . Smoking status: Current Every Day Smoker    Types: Cigarettes  . Smokeless tobacco: Never Used  Substance Use Topics  . Alcohol use: Yes  . Drug use: No    Review of Systems  Constitutional: No fever/chills Eyes: No visual changes. ENT: No sore throat. Cardiovascular: Positive for chest pain. Respiratory: Positive for shortness of breath. Gastrointestinal: No abdominal pain.  No nausea, no  vomiting.  No diarrhea.  No constipation. Genitourinary: Negative for dysuria. Musculoskeletal: Negative for back pain. Skin: Negative for rash. Neurological: Negative for headaches, focal weakness or numbness. ____________________________________________   PHYSICAL EXAM:  VITAL SIGNS: ED Triage Vitals  Enc Vitals Group     BP 12/01/19 1152 (!) 157/113     Pulse Rate 12/01/19 1152 90     Resp 12/01/19 1152 20     Temp 12/01/19 1152 (!) 97.5 F (36.4 C)     Temp Source 12/01/19 1152 Oral     SpO2 12/01/19 1152 95 %     Weight 12/01/19 1149 244 lb (110.7 kg)     Height 12/01/19 1149 6' (1.829 m)     Head Circumference --      Peak Flow --      Pain Score 12/01/19 1149 6     Pain Loc --      Pain Edu? --      Excl. in [place]? --     Constitutional: Alert and oriented. Well appearing and in no acute distress. Eyes: Conjunctivae are normal. Head: Atraumatic. Nose: No congestion/rhinnorhea. Mouth/Throat: Mucous membranes are moist.  Oropharynx non-erythematous. Neck: No stridor.   Hematological/Lymphatic/Immunilogical: No cervical lymphadenopathy. Cardiovascular: Normal rate, regular rhythm. Grossly normal heart sounds.  Good peripheral circulation. Respiratory: Normal respiratory effort.  No retractions. Lungs CTAB. Gastrointestinal: Soft and nontender. No distention. No abdominal bruits. No CVA tenderness. Genitourinary:  Musculoskeletal: No lower extremity tenderness nor edema.  No joint effusions. Neurologic:  Normal speech and language. No gross focal  neurologic deficits are appreciated. No gait instability. Skin:  Skin is warm, dry and intact. No rash noted. Psychiatric: Mood and affect are normal. Speech and behavior are normal.  ____________________________________________   LABS (all labs ordered are listed, but only abnormal results are displayed)  Labs Reviewed  BASIC METABOLIC PANEL - Abnormal; Notable for the following components:      Result Value   Glucose,  Bld 125 (*)    All other components within normal limits  CBC  TROPONIN I (HIGH SENSITIVITY)   ____________________________________________  EKG  ED ECG REPORT I, Reggie Welge, FNP-BC personally viewed and interpreted this ECG.   Date: 12/01/2019  EKG Time: 11:54 AM  Rate: 86  Rhythm: normal sinus rhythm  Axis: Normal  Intervals:none  ST&T Change: No ST elevation or new bundle branch block  ____________________________________________  RADIOLOGY  ED MD interpretation:    Chest x-ray negative for acute findings I, Ellianne Gowen, personally viewed and evaluated these images (plain radiographs) as part of my medical decision making, as well as reviewing the written report by the radiologist.  Official radiology report(s): DG Chest 2 View  Result Date: 12/01/2019 CLINICAL DATA:  44 year old male with chest pain shortness of breath and productive cough. EXAM: CHEST - 2 VIEW COMPARISON:  Chest radiographs 06/27/2019 and earlier. FINDINGS: Low normal lung volumes. Mediastinal contours remain normal. Visualized tracheal air column is within normal limits. Both lungs appear clear. No pneumothorax or pleural effusion. No acute osseous abnormality identified. Negative visible bowel gas pattern. IMPRESSION: Negative.  No cardiopulmonary abnormality. Electronically Signed   By: Odessa Fleming M.D.   On: 12/01/2019 13:32    ____________________________________________   PROCEDURES  Procedure(s) performed (including Critical Care):  Procedures  ____________________________________________   INITIAL IMPRESSION / ASSESSMENT AND PLAN     44 year old male presenting to the emergency department for treatment and evaluation of symptoms as described in the HPI.  Waiting on remainder of labs. Exam is reassuring.   DIFFERENTIAL DIAGNOSIS  Cardiac event, asthma exacerbation, bronchitis  ED COURSE  Labs are reassuring including a negative troponin.  Exam is most consistent with bronchitis.   He will be treated with albuterol.  He is to call and schedule follow-up appointment with primary care if not improving over the next few days.  He is to return to emergency department for symptoms change or worsen if unable to schedule an appointment. ____________________________________________   FINAL CLINICAL IMPRESSION(S) / ED DIAGNOSES  Final diagnoses:  Bronchitis  Hypertension, unspecified type     ED Discharge Orders         Ordered    albuterol (VENTOLIN HFA) 108 (90 Base) MCG/ACT inhaler  Every 4 hours PRN     12/01/19 1623           [name] was evaluated in Emergency Department on 12/01/2019 for the symptoms described in the history of present illness. He was evaluated in the context of the global COVID-19 pandemic, which necessitated consideration that the patient might be at risk for infection with the SARS-CoV-2 virus that causes COVID-19. Institutional protocols and algorithms that pertain to the evaluation of patients at risk for COVID-19 are in a state of rapid change based on information released by regulatory bodies including the CDC and federal and state organizations. These policies and algorithms were followed during the patient's care in the ED.   Note:  This document was prepared using Dragon voice recognition software and may include unintentional dictation errors.   Bruce Churilla  B, FNP 12/01/19 2045    Chesley Noon, MD 12/02/19 [phone]

## 2020-04-04 ENCOUNTER — Other Ambulatory Visit: Payer: Self-pay

## 2020-04-04 ENCOUNTER — Emergency Department: Payer: Medicaid Other

## 2020-04-04 ENCOUNTER — Emergency Department
Admission: EM | Admit: 2020-04-04 | Discharge: 2020-04-04 | Disposition: A | Discharge status: Home / Self Care | Payer: Medicaid Other | Attending: Emergency Medicine | Admitting: Emergency Medicine

## 2020-04-04 DIAGNOSIS — E119 Type 2 diabetes mellitus without complications: Secondary | ICD-10-CM | POA: Insufficient documentation

## 2020-04-04 DIAGNOSIS — F17210 Nicotine dependence, cigarettes, uncomplicated: Secondary | ICD-10-CM | POA: Insufficient documentation

## 2020-04-04 DIAGNOSIS — J45909 Unspecified asthma, uncomplicated: Secondary | ICD-10-CM | POA: Insufficient documentation

## 2020-04-04 DIAGNOSIS — I10 Essential (primary) hypertension: Secondary | ICD-10-CM | POA: Insufficient documentation

## 2020-04-04 DIAGNOSIS — Z79899 Other long term (current) drug therapy: Secondary | ICD-10-CM | POA: Insufficient documentation

## 2020-04-04 DIAGNOSIS — R569 Unspecified convulsions: Secondary | ICD-10-CM | POA: Insufficient documentation

## 2020-04-04 LAB — URINE DRUG SCREEN, QUALITATIVE (ARMC ONLY)
Amphetamines, Ur Screen: POSITIVE — AB
Barbiturates, Ur Screen: NOT DETECTED
Benzodiazepine, Ur Scrn: NOT DETECTED
Cannabinoid 50 Ng, Ur ~~LOC~~: NOT DETECTED
Cocaine Metabolite,Ur ~~LOC~~: NOT DETECTED
MDMA (Ecstasy)Ur Screen: NOT DETECTED
Methadone Scn, Ur: NOT DETECTED
Opiate, Ur Screen: NOT DETECTED
Phencyclidine (PCP) Ur S: NOT DETECTED
Tricyclic, Ur Screen: NOT DETECTED

## 2020-04-04 LAB — CBC WITH DIFFERENTIAL/PLATELET
Abs Immature Granulocytes: 0.05 10*3/uL (ref 0.00–0.07)
Basophils Absolute: 0.1 10*3/uL (ref 0.0–0.1)
Basophils Relative: 1 %
Eosinophils Absolute: 0.2 10*3/uL (ref 0.0–0.5)
Eosinophils Relative: 2 %
HCT: 38.8 % — ABNORMAL LOW (ref 39.0–52.0)
Hemoglobin: 13.3 g/dL (ref 13.0–17.0)
Immature Granulocytes: 1 %
Lymphocytes Relative: 25 %
Lymphs Abs: 2.5 10*3/uL (ref 0.7–4.0)
MCH: 29.3 pg (ref 26.0–34.0)
MCHC: 34.3 g/dL (ref 30.0–36.0)
MCV: 85.5 fL (ref 80.0–100.0)
Monocytes Absolute: 0.9 10*3/uL (ref 0.1–1.0)
Monocytes Relative: 9 %
Neutro Abs: 6.3 10*3/uL (ref 1.7–7.7)
Neutrophils Relative %: 62 %
Platelets: 280 10*3/uL (ref 150–400)
RBC: 4.54 MIL/uL (ref 4.22–5.81)
RDW: 13.8 % (ref 11.5–15.5)
WBC: 10 10*3/uL (ref 4.0–10.5)
nRBC: 0 % (ref 0.0–0.2)

## 2020-04-04 LAB — COMPREHENSIVE METABOLIC PANEL
ALT: 72 U/L — ABNORMAL HIGH (ref 0–44)
AST: 74 U/L — ABNORMAL HIGH (ref 15–41)
Albumin: 3.9 g/dL (ref 3.5–5.0)
Alkaline Phosphatase: 39 U/L (ref 38–126)
Anion gap: 14 (ref 5–15)
BUN: 17 mg/dL (ref 6–20)
CO2: 20 mmol/L — ABNORMAL LOW (ref 22–32)
Calcium: 8.7 mg/dL — ABNORMAL LOW (ref 8.9–10.3)
Chloride: 102 mmol/L (ref 98–111)
Creatinine, Ser: 1.39 mg/dL — ABNORMAL HIGH (ref 0.61–1.24)
GFR calc Af Amer: >60 mL/min (ref >60)
GFR calc non Af Amer: >60 mL/min (ref >60)
Glucose, Bld: 113 mg/dL — ABNORMAL HIGH (ref 70–99)
Potassium: 3.2 mmol/L — ABNORMAL LOW (ref 3.5–5.1)
Sodium: 136 mmol/L (ref 135–145)
Total Bilirubin: 0.7 mg/dL (ref 0.3–1.2)
Total Protein: 7.5 g/dL (ref 6.5–8.1)

## 2020-04-04 LAB — ETHANOL: Alcohol, Ethyl (B): <10 mg/dL (ref <10)

## 2020-04-04 MED ORDER — SODIUM CHLORIDE 0.9 % IV BOLUS
1000.0000 mL | Freq: Once | INTRAVENOUS | Status: AC
  Administered 2020-04-04: 1000 mL via INTRAVENOUS

## 2020-04-04 MED ORDER — IPRATROPIUM-ALBUTEROL 0.5-2.5 (3) MG/3ML IN SOLN
3.0000 mL | Freq: Once | RESPIRATORY_TRACT | Status: AC
  Administered 2020-04-04: 3 mL via RESPIRATORY_TRACT
  Filled 2020-04-04: qty 3

## 2020-04-04 NOTE — ED Notes (Signed)
Pt remains difficult to wake up with snoring respirations. When woken up with sternal rub, pt remains oriented.

## 2020-04-04 NOTE — ED Notes (Signed)
Family contacted about pt impending discharge. Family states they will be on the way to ER to pick pt up shortly.

## 2020-04-04 NOTE — Discharge Instructions (Addendum)
You should follow-up with a neurologist (referral has been provided) as well as a primary care provider within the next few weeks.  At this time, you do not need to be on a seizure medication although you may need to be put on one if you have another seizure episode.  Return to the ER immediately for new, worsening, or recurrent seizures, shaking, passing out, severe headache, vision changes, weakness or numbness, difficulty speaking or walking, or any other new or worsening symptoms that concern you.

## 2020-04-04 NOTE — ED Triage Notes (Signed)
Pt comes EMS from work after being found unresponsive in bathroom. Pt has hx of seizures. Had lost bowel/bladder control. No evidence of biting tongue. Pt also has a slight droop to the right side of his face with some slurred speech which is not typical for him. Pt is AOx4.

## 2020-04-04 NOTE — ED Notes (Signed)
Pt given ice water per request. Head of bed raised for pt. Pt tolerated water well.

## 2020-04-04 NOTE — ED Notes (Signed)
CBG 96 with EMS

## 2020-04-04 NOTE — ED Provider Notes (Signed)
[facility] Emergency Department Provider Note ____________________________________________   First MD Initiated Contact with Patient 04/04/20 1531     (approximate)  I have reviewed the triage vital signs and the nursing notes.   HISTORY  Chief Complaint Seizures    HPI Aaron [name] is a 44 y.o. male with PMH as noted below as well as a prior history of seizure presents after an apparent seizure while at his group home today.  Per EMS, staff heard the patient fall and found him to be altered and intermittently shaking, however there is no report of whether the patient had convulsions or generalized tonic-clonic activity.  The patient remained confused and somnolent for some time, and now has become more awake.  He reports that he had a seizure previously when he was about 44 years old.  He is not on any medication for seizures.  He reports a headache but denies other acute symptoms.  EMS reported concern for possible facial droop although this appears to have resolved.  Past Medical History:  Diagnosis Date  . Asthma   . Diabetes mellitus without complication ([facility])   . Hypertension     Patient Active Problem List   Diagnosis Date Noted  . 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 No pertinent surgical history.  Prior to Admission medications   Medication Sig Start Date End Date Taking? Authorizing Provider  hydrochlorothiazide (HYDRODIURIL) 25 MG tablet Take 25 mg by mouth daily.   Yes [provider]  albuterol (VENTOLIN HFA) 108 (90 Base) MCG/ACT inhaler Inhale 2 puffs into the lungs every 4 (four) hours as needed for wheezing or shortness of breath. 12/01/19   Triplett, Kasandra Knudsen, FNP    Allergies Levaquin [levofloxacin]  History reviewed. No pertinent family history.  Social History Social History   Tobacco Use  . Smoking status: Current Every Day Smoker    Types: Cigarettes  . Smokeless tobacco: Never Used  Substance Use  Topics  . Alcohol use: Yes  . Drug use: No    Review of Systems  Constitutional: No fever. Eyes: No redness. ENT: No sore throat or neck pain. Cardiovascular: Denies chest pain. Respiratory: Denies shortness of breath. Gastrointestinal: No vomiting. Genitourinary: Negative for flank pain. Musculoskeletal: Negative for back pain. Skin: Negative for rash. Neurological: Positive for headache.   ____________________________________________   PHYSICAL EXAM:  VITAL SIGNS: ED Triage Vitals  Enc Vitals Group     BP 04/04/20 1536 (!) 136/103     Pulse Rate 04/04/20 1536 95     Resp 04/04/20 1536 18     Temp 04/04/20 1538 98.1 F (36.7 C)     Temp Source 04/04/20 1538 Oral     SpO2 04/04/20 1536 95 %     Weight 04/04/20 1536 272 lb (123.4 kg)     Height 04/04/20 1536 5\' 11"  (1.803 m)     Head Circumference --      Peak Flow --      Pain Score 04/04/20 1536 5     Pain Loc --      Pain Edu? --      Excl. in [place]? --     Constitutional: Alert and oriented.  Elderly comfortable appearing, in no acute distress. Eyes: Conjunctivae are normal.  EOMI.  PERRLA. Head: 2 cm superficial abrasion to forehead/nasal bridge area. Nose: No congestion/rhinnorhea. Mouth/Throat: Mucous membranes are moist.   Neck: Normal range of motion.  No midline cervical spinal tenderness. Cardiovascular: Normal rate, regular  rhythm. Grossly normal heart sounds.  Good peripheral circulation. Respiratory: Normal respiratory effort.  No retractions. Lungs CTAB. Gastrointestinal: Soft and nontender. No distention.  Genitourinary: No flank tenderness. Musculoskeletal: No lower extremity edema.  Extremities warm and well perfused.  Neurologic: Mildly slurred speech.  Motor intact in all extremities.  No facial droop.  No pronator drift.  5/5 motor strength and intact sensation to all extremities. Skin:  Skin is warm and dry. No rash noted. Psychiatric: Calm and  cooperative.  ____________________________________________   LABS (all labs ordered are listed, but only abnormal results are displayed)  Labs Reviewed  CBC WITH DIFFERENTIAL/PLATELET - Abnormal; Notable for the following components:      Result Value   HCT 38.8 (*)    All other components within normal limits  URINE DRUG SCREEN, QUALITATIVE ([facility] ONLY) - Abnormal; Notable for the following components:   Amphetamines, Ur Screen POSITIVE (*)    All other components within normal limits  COMPREHENSIVE METABOLIC PANEL - Abnormal; Notable for the following components:   Potassium 3.2 (*)    CO2 20 (*)    Glucose, Bld 113 (*)    Creatinine, Ser 1.39 (*)    Calcium 8.7 (*)    AST 74 (*)    ALT 72 (*)    All other components within normal limits  ETHANOL   ____________________________________________  EKG  ED ECG REPORT I, Dionne Bucy, the attending physician, personally viewed and interpreted this ECG.  Date: 04/04/2020 EKG Time: 1545 Rate: 97 Rhythm: normal sinus rhythm QRS Axis: normal Intervals: LPFB, borderline prolonged QTc ST/T Wave abnormalities: normal Narrative Interpretation: no evidence of acute ischemia  ____________________________________________  RADIOLOGY  CT head: No ICH or other acute abnormality  ____________________________________________   PROCEDURES  Procedure(s) performed: No  Procedures  Critical Care performed: No ____________________________________________   INITIAL IMPRESSION / ASSESSMENT AND PLAN / ED COURSE  Pertinent labs & imaging results that were available during my care of the patient were reviewed by me and considered in my medical decision making (see chart for details).  44 year old male with PMH as noted above including a prior history of an apparent seizure when he was a teenager presents after he was found down on the ground and was altered, with concern for a possible seizure.  Per EMS, the patient remained  altered and appeared postictal.  There was also concern for a possible facial droop although this appears to have resolved.  The patient reports a headache but denies other acute complaints.  I reviewed the past medical records in Epic.  The patient has had several visits over the last few years for unrelated symptoms.  He has no documented history of seizure disorder or seizure work-up, although per his report his last seizure would have been around 30 years ago.  On exam he is alert and oriented x4.  His vital signs are normal.  He has somewhat slurred speech but no facial droop and no focal deficits on neurologic exam.  He is calm and cooperative, and joking around with staff.  The remainder of the exam is unremarkable.  Differential includes seizure with postictal state, syncope, versus possible alcohol or drug intoxication.  I have a low suspicion for metabolic etiology or cardiac cause.  There is no evidence of acute stroke.  Since it has been so long since his last apparent seizure, we will obtain a first-time seizure work-up including CT head lab work-up including UDS and ethanol, and reassess.  ----------------------------------------- 11:19 PM on  04/04/2020 -----------------------------------------  CT head shows no acute findings.  The lab work-up is unremarkable except for borderline low potassium and calcium, as well as borderline elevated LFTs, all which appear chronic.  The patient slept for several hours.  He is now fully awake, and continues to be oriented x4.  Neurologic exam remains nonfocal.  His speech is clear.  His altered mental status and slurred speech earlier appear consistent with a postictal state.  At this time, the patient is comfortable and feels well to go home.  I counseled him on the results of the work-up.  At this time, given that his last seizure was 30 years ago, this is essentially a first-time seizure and there is no indication to start the patient on an  antiepileptic at this time.  I recommend neurology follow-up and have provided a referral.  The patient already does not drive or operate heavy machinery.  I gave thorough return precautions and he expressed understanding.  ____________________________________________   FINAL CLINICAL IMPRESSION(S) / ED DIAGNOSES  Final diagnoses:  Seizure (HCC)      NEW MEDICATIONS STARTED DURING THIS VISIT:  New Prescriptions   No medications on file     Note:  This document was prepared using Dragon voice recognition software and may include unintentional dictation errors.   Dionne Bucy, MD 04/04/20 2322

## 2020-07-17 DIAGNOSIS — Z03818 Encounter for observation for suspected exposure to other biological agents ruled out: Secondary | ICD-10-CM | POA: Diagnosis not present

## 2020-07-17 DIAGNOSIS — Z1152 Encounter for screening for COVID-19: Secondary | ICD-10-CM | POA: Diagnosis not present

## 2020-07-22 IMAGING — CR DG CHEST 2V
2 series · 2 of 2 positions shown · non-contrast
Comparison: Chest radiographs 06/27/2019 and earlier.

CLINICAL DATA: 44-year-old male with chest pain shortness of breath
and productive cough.

EXAM:
CHEST - 2 VIEW

[chest pa]
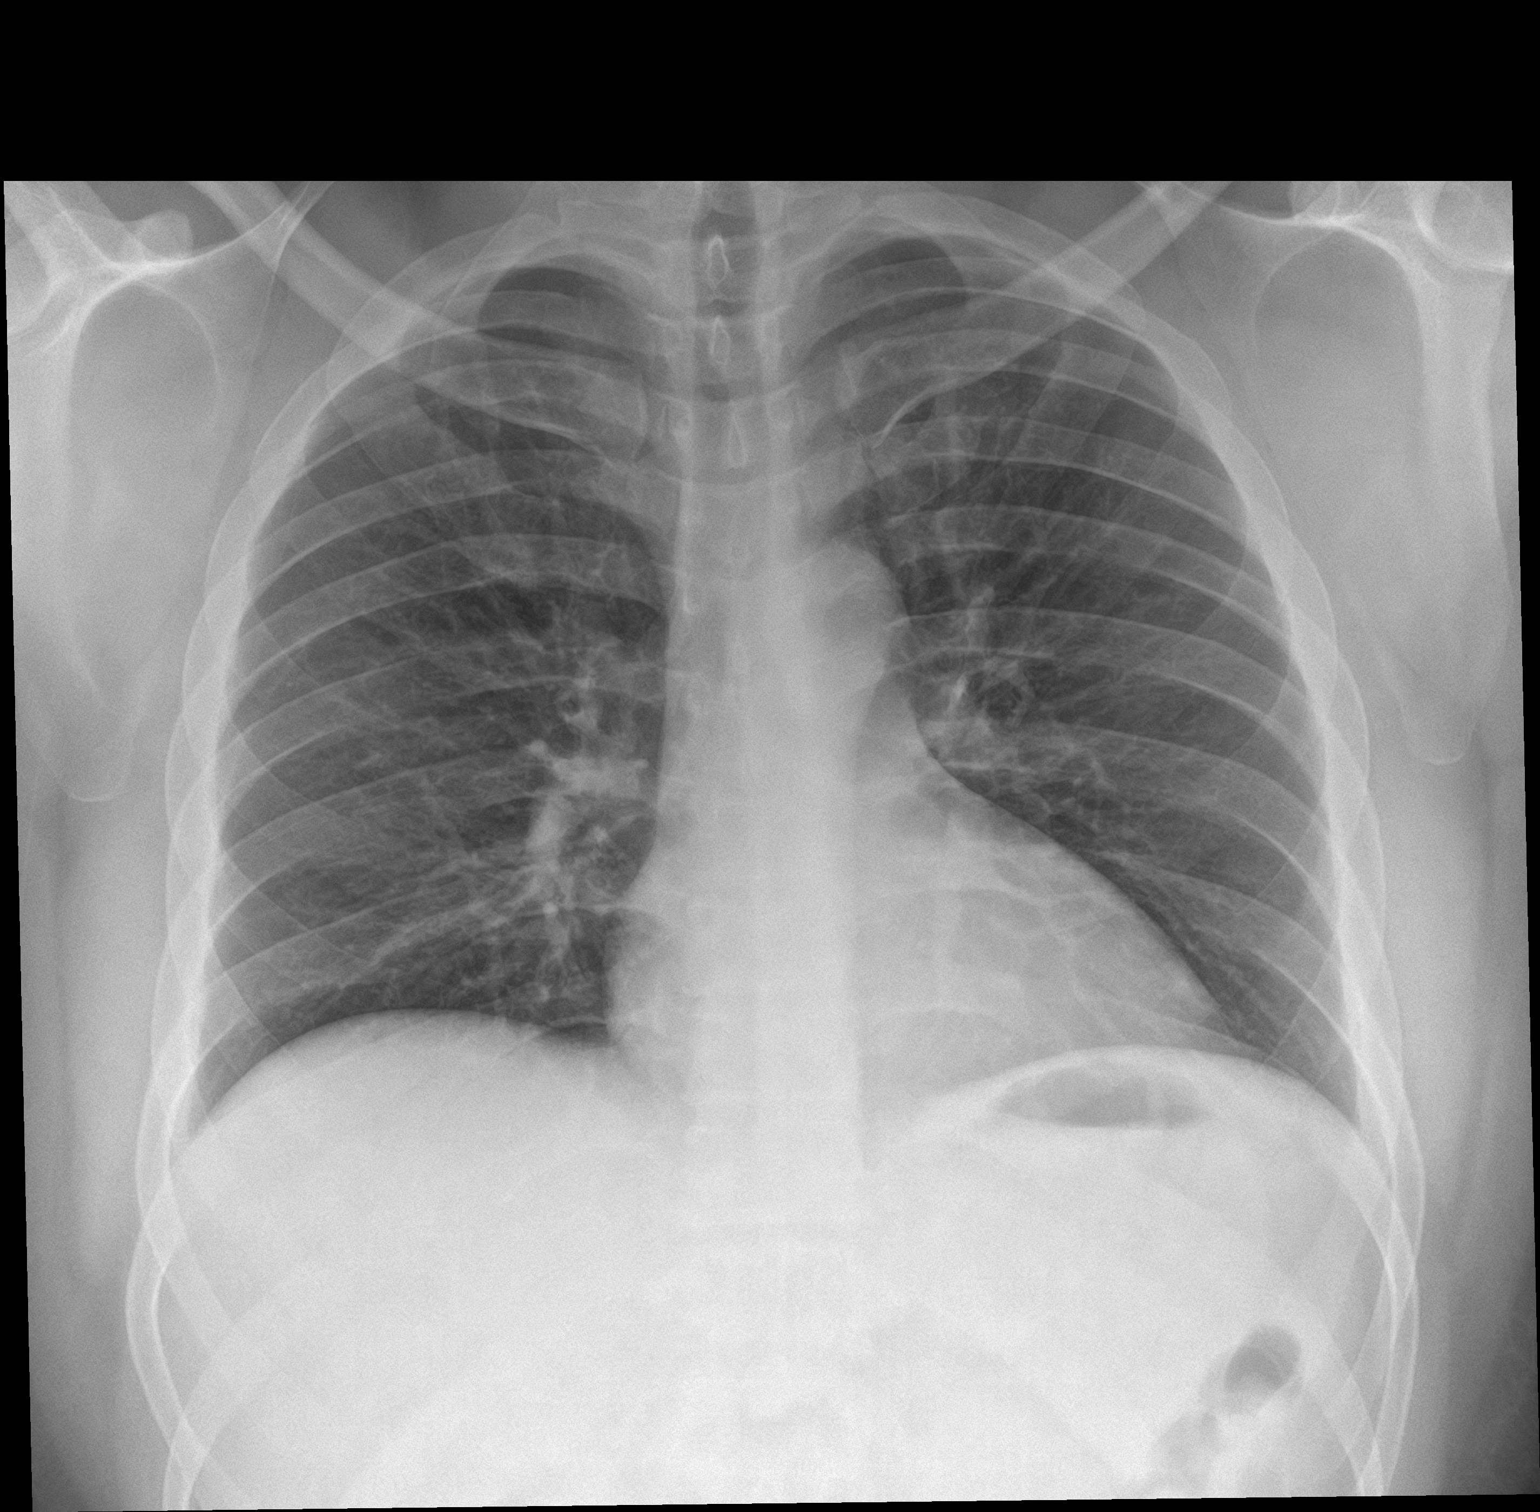

[chest lat]
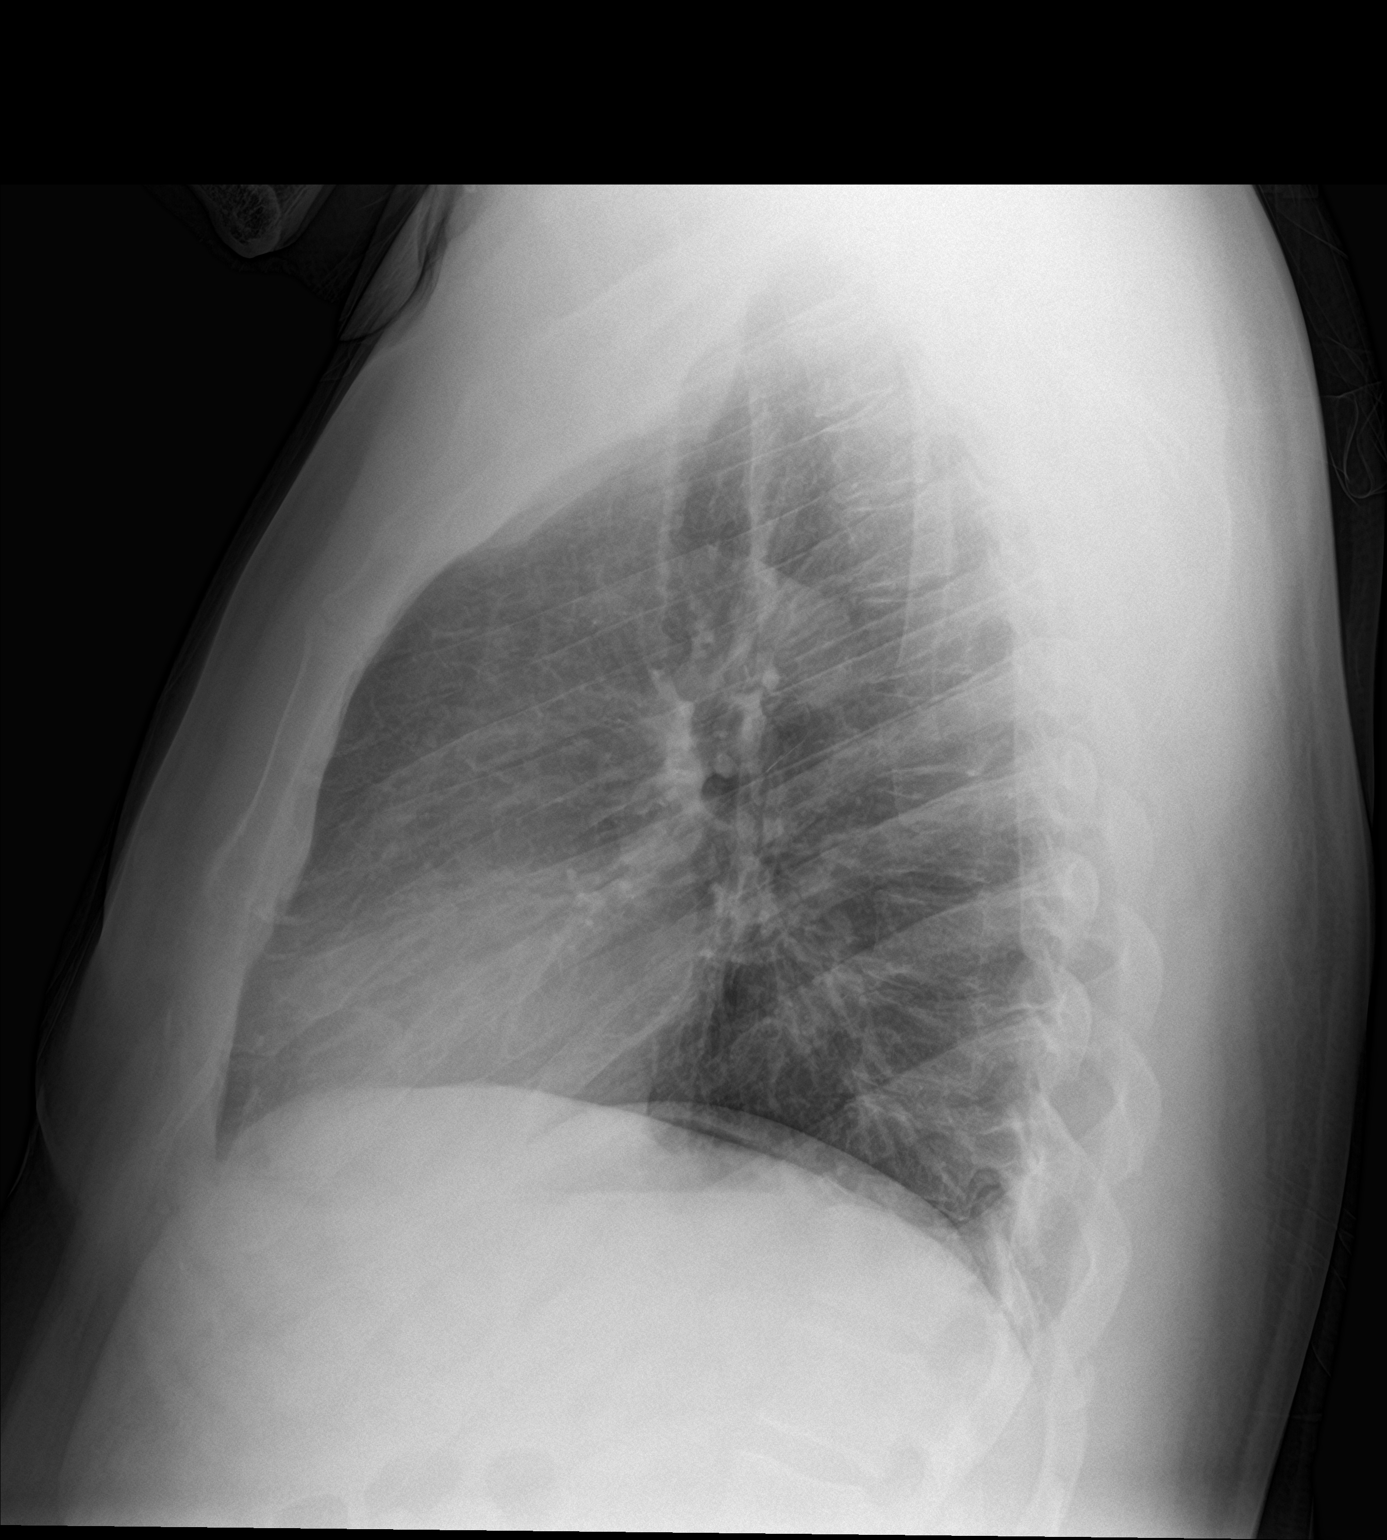

[2 of 2 positions shown; findings below may reference images not displayed]

FINDINGS: Low normal lung volumes. Mediastinal contours remain normal.
Visualized tracheal air column is within normal limits. Both lungs
appear clear. No pneumothorax or pleural effusion. No acute osseous
abnormality identified. Negative visible bowel gas pattern.
IMPRESSION: Negative.  No cardiopulmonary abnormality.

## 2020-07-24 DIAGNOSIS — Z03818 Encounter for observation for suspected exposure to other biological agents ruled out: Secondary | ICD-10-CM | POA: Diagnosis not present

## 2020-07-24 DIAGNOSIS — Z1152 Encounter for screening for COVID-19: Secondary | ICD-10-CM | POA: Diagnosis not present

## 2020-12-01 ENCOUNTER — Encounter: Payer: Self-pay | Admitting: Emergency Medicine

## 2020-12-01 ENCOUNTER — Emergency Department
Admission: EM | Admit: 2020-12-01 | Discharge: 2020-12-01 | Disposition: A | Discharge status: Home / Self Care | Payer: Medicaid Other | Attending: Emergency Medicine | Admitting: Emergency Medicine

## 2020-12-01 ENCOUNTER — Emergency Department: Payer: Medicaid Other

## 2020-12-01 ENCOUNTER — Other Ambulatory Visit: Payer: Self-pay

## 2020-12-01 DIAGNOSIS — I10 Essential (primary) hypertension: Secondary | ICD-10-CM | POA: Diagnosis not present

## 2020-12-01 DIAGNOSIS — F17210 Nicotine dependence, cigarettes, uncomplicated: Secondary | ICD-10-CM | POA: Insufficient documentation

## 2020-12-01 DIAGNOSIS — R5381 Other malaise: Secondary | ICD-10-CM | POA: Diagnosis not present

## 2020-12-01 DIAGNOSIS — R609 Edema, unspecified: Secondary | ICD-10-CM | POA: Diagnosis not present

## 2020-12-01 DIAGNOSIS — Z79899 Other long term (current) drug therapy: Secondary | ICD-10-CM | POA: Insufficient documentation

## 2020-12-01 DIAGNOSIS — M1000 Idiopathic gout, unspecified site: Secondary | ICD-10-CM | POA: Insufficient documentation

## 2020-12-01 DIAGNOSIS — E119 Type 2 diabetes mellitus without complications: Secondary | ICD-10-CM | POA: Insufficient documentation

## 2020-12-01 DIAGNOSIS — M79604 Pain in right leg: Secondary | ICD-10-CM

## 2020-12-01 DIAGNOSIS — M79661 Pain in right lower leg: Secondary | ICD-10-CM | POA: Insufficient documentation

## 2020-12-01 DIAGNOSIS — J45909 Unspecified asthma, uncomplicated: Secondary | ICD-10-CM | POA: Insufficient documentation

## 2020-12-01 LAB — COMPREHENSIVE METABOLIC PANEL
ALT: 28 U/L (ref 0–44)
AST: 23 U/L (ref 15–41)
Albumin: 4 g/dL (ref 3.5–5.0)
Alkaline Phosphatase: 36 U/L — ABNORMAL LOW (ref 38–126)
Anion gap: 8 (ref 5–15)
BUN: 16 mg/dL (ref 6–20)
CO2: 27 mmol/L (ref 22–32)
Calcium: 8.9 mg/dL (ref 8.9–10.3)
Chloride: 100 mmol/L (ref 98–111)
Creatinine, Ser: 1.18 mg/dL (ref 0.61–1.24)
GFR, Estimated: >60 mL/min (ref >60)
Glucose, Bld: 104 mg/dL — ABNORMAL HIGH (ref 70–99)
Potassium: 4 mmol/L (ref 3.5–5.1)
Sodium: 135 mmol/L (ref 135–145)
Total Bilirubin: 0.3 mg/dL (ref 0.3–1.2)
Total Protein: 8.5 g/dL — ABNORMAL HIGH (ref 6.5–8.1)

## 2020-12-01 LAB — CBC WITH DIFFERENTIAL/PLATELET
Abs Immature Granulocytes: 0.07 10*3/uL (ref 0.00–0.07)
Basophils Absolute: 0.1 10*3/uL (ref 0.0–0.1)
Basophils Relative: 1 %
Eosinophils Absolute: 0.2 10*3/uL (ref 0.0–0.5)
Eosinophils Relative: 2 %
HCT: 42.6 % (ref 39.0–52.0)
Hemoglobin: 13.9 g/dL (ref 13.0–17.0)
Immature Granulocytes: 1 %
Lymphocytes Relative: 32 %
Lymphs Abs: 3.1 10*3/uL (ref 0.7–4.0)
MCH: 28.4 pg (ref 26.0–34.0)
MCHC: 32.6 g/dL (ref 30.0–36.0)
MCV: 87.1 fL (ref 80.0–100.0)
Monocytes Absolute: 1.1 10*3/uL — ABNORMAL HIGH (ref 0.1–1.0)
Monocytes Relative: 11 %
Neutro Abs: 5.2 10*3/uL (ref 1.7–7.7)
Neutrophils Relative %: 53 %
Platelets: 367 10*3/uL (ref 150–400)
RBC: 4.89 MIL/uL (ref 4.22–5.81)
RDW: 13.5 % (ref 11.5–15.5)
WBC: 9.7 10*3/uL (ref 4.0–10.5)
nRBC: 0 % (ref 0.0–0.2)

## 2020-12-01 LAB — URIC ACID: Uric Acid, Serum: 8.9 mg/dL — ABNORMAL HIGH (ref 3.7–8.6)

## 2020-12-01 LAB — BRAIN NATRIURETIC PEPTIDE: B Natriuretic Peptide: 11.6 pg/mL (ref 0.0–100.0)

## 2020-12-01 MED ORDER — COLCHICINE 0.6 MG PO TABS: 0.6000 mg | ORAL_TABLET | Freq: Two times a day (BID) | ORAL | 2 refills | Status: DC

## 2020-12-01 MED ORDER — MORPHINE SULFATE (PF) 4 MG/ML IV SOLN
4.0000 mg | Freq: Once | INTRAVENOUS | Status: AC
  Administered 2020-12-01: 4 mg via INTRAVENOUS
  Filled 2020-12-01: qty 1

## 2020-12-01 MED ORDER — ONDANSETRON HCL 4 MG/2ML IJ SOLN
4.0000 mg | Freq: Once | INTRAMUSCULAR | Status: AC
  Administered 2020-12-01: 4 mg via INTRAVENOUS
  Filled 2020-12-01: qty 2

## 2020-12-01 MED ORDER — HYDROCHLOROTHIAZIDE 25 MG PO TABS: 25.0000 mg | ORAL_TABLET | Freq: Every day | ORAL | 6 refills | Status: DC

## 2020-12-01 MED ORDER — CARVEDILOL 6.25 MG PO TABS
6.2500 mg | ORAL_TABLET | Freq: Two times a day (BID) | ORAL | 11 refills | Status: DC
Start: 2020-12-01 — End: 2022-06-15

## 2020-12-01 MED ORDER — ALBUTEROL SULFATE HFA 108 (90 BASE) MCG/ACT IN AERS: 2.0000 | INHALATION_SPRAY | Freq: Four times a day (QID) | RESPIRATORY_TRACT | 2 refills | Status: DC | PRN

## 2020-12-01 MED ORDER — HYDROCODONE-ACETAMINOPHEN 5-325 MG PO TABS: 1.0000 | ORAL_TABLET | Freq: Four times a day (QID) | ORAL | 0 refills | Status: DC | PRN

## 2020-12-01 NOTE — ED Triage Notes (Signed)
Presents via EMS with pain and swelling to right lower leg and ankle  sxs' started about 2 weeks ago w/o injury

## 2020-12-01 NOTE — ED Provider Notes (Signed)
[facility] Emergency Department Provider Note  ____________________________________________   Event Date/Time   First MD Initiated Contact with Patient 12/01/20 1128     (approximate)  I have reviewed the triage vital signs and the nursing notes.   HISTORY  Chief Complaint Leg Swelling    HPI Aaron [name] is a 45 y.o. male with history of COPD, hypertension, diabetes, and asthma presents with right lower leg pain.  Patient states its been painful for about 2 weeks.  No known injury.  States he now his ankle and foot are also swollen.  He does have a history of gout but states that only last a couple of days.  States been painful to walk on at this point.  He has been out of his regular medications for about 3 months as he cannot afford to go to his doctor.  He denies chest pain or shortness of breath at this time   Past Medical History:  Diagnosis Date  . Asthma   . Diabetes mellitus without complication (HCC)   . Hypertension     Patient Active Problem List   Diagnosis Date Noted  . 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 No pertinent surgical history.  Prior to Admission medications   Medication Sig Start Date End Date Taking? Authorizing Provider  albuterol (VENTOLIN HFA) 108 (90 Base) MCG/ACT inhaler Inhale 2 puffs into the lungs every 6 (six) hours as needed for wheezing or shortness of breath. 12/01/20  Yes Mitchelle Goerner, Roselyn Bering, PA-C  carvedilol (COREG) 6.25 MG tablet Take 1 tablet (6.25 mg total) by mouth 2 (two) times daily. 12/01/20 12/01/21 Yes Leviathan Macera, Roselyn Bering, PA-C  colchicine 0.6 MG tablet Take 1 tablet (0.6 mg total) by mouth 2 (two) times daily. 12/01/20 12/01/21 Yes Richrd Kuzniar, Roselyn Bering, PA-C  HYDROcodone-acetaminophen (NORCO/VICODIN) 5-325 MG tablet Take 1 tablet by mouth every 6 (six) hours as needed for moderate pain. 12/01/20  Yes Lindsie Simar, Roselyn Bering, PA-C  hydrochlorothiazide (HYDRODIURIL) 25 MG tablet Take 1 tablet (25 mg total) by mouth  daily. 12/01/20   Faythe Ghee, PA-C    Allergies Levaquin [levofloxacin]  No family history on file.  Social History Social History   Tobacco Use  . Smoking status: Current Every Day Smoker    Types: Cigarettes  . Smokeless tobacco: Never Used  Substance Use Topics  . Alcohol use: Yes  . Drug use: No    Review of Systems  Constitutional: No fever/chills Eyes: No visual changes. ENT: No sore throat. Respiratory: Denies cough Cardiovascular: Denies chest pain Gastrointestinal: Denies abdominal pain Genitourinary: Negative for dysuria. Musculoskeletal: Negative for back pain.  Positive right lower leg pain Skin: Negative for rash. Psychiatric: no mood changes,     ____________________________________________   PHYSICAL EXAM:  VITAL SIGNS: ED Triage Vitals  Enc Vitals Group     BP      Pulse      Resp      Temp      Temp src      SpO2      Weight      Height      Head Circumference      Peak Flow      Pain Score      Pain Loc      Pain Edu?      Excl. in GC?     Constitutional: Alert and oriented. Well appearing and in no acute distress. Eyes: Conjunctivae are normal.  Head: Atraumatic. Nose: No  congestion/rhinnorhea. Mouth/Throat: Mucous membranes are moist.   Neck:  supple no lymphadenopathy noted Cardiovascular: Normal rate, regular rhythm. Heart sounds are normal Respiratory: Normal respiratory effort.  No retractions, lungs c t a  GU: deferred Musculoskeletal: FROM all extremities, warm and well perfused, right lower leg is tender along the calf, positive Homans' sign, right ankle and foot are swollen, neurovascular is intact Neurologic:  Normal speech and language.  Skin:  Skin is warm, dry and intact. No rash noted. Psychiatric: Mood and affect are normal. Speech and behavior are normal.  ____________________________________________   LABS (all labs ordered are listed, but only abnormal results are displayed)  Labs Reviewed   COMPREHENSIVE METABOLIC PANEL - Abnormal; Notable for the following components:      Result Value   Glucose, Bld 104 (*)    Total Protein 8.5 (*)    Alkaline Phosphatase 36 (*)    All other components within normal limits  CBC WITH DIFFERENTIAL/PLATELET - Abnormal; Notable for the following components:   Monocytes Absolute 1.1 (*)    All other components within normal limits  URIC ACID - Abnormal; Notable for the following components:   Uric Acid, Serum 8.9 (*)    All other components within normal limits  BRAIN NATRIURETIC PEPTIDE   ____________________________________________   ____________________________________________  RADIOLOGY  Ultrasound right lower extremity  ____________________________________________   PROCEDURES  Procedure(s) performed: No  Procedures    ____________________________________________   INITIAL IMPRESSION / ASSESSMENT AND PLAN / ED COURSE  Pertinent labs & imaging results that were available during my care of the patient were reviewed by me and considered in my medical decision making (see chart for details).   The patient is 45 year old male with history of hypertension, COPD, diabetes presents with right lower leg pain.  See HPI.  Physical exam shows patient were stable at this time  DDx: Gout, DVT, CHF  Labs and imaging ordered   Uric acid is elevated at 8.9, comprehensive metabolic panel is basically normal, CBC is normal, BNP is normal  I feel that the patient just has a gout flare, there ultrasound reviewed by me confirmed by radiology does not show any DVT.  Patient does not have CHF as is not bilateral swelling, he is not short of breath, and his BNP is normal  I did discuss the findings to the patient.  I did give him refills on his regular medications that he has not been taking.  Explained to him he could follow-up with [facility], [place] [facility], or [facility].  He is to return to the emergency department  if worsening.  He was given a prescription for colchicine and Vicodin.  He was discharged in stable condition.  Aaron [name] was evaluated in Emergency Department on 12/01/2020 for the symptoms described in the history of present illness. He was evaluated in the context of the global COVID-19 pandemic, which necessitated consideration that the patient might be at risk for infection with the SARS-CoV-2 virus that causes COVID-19. Institutional protocols and algorithms that pertain to the evaluation of patients at risk for COVID-19 are in a state of rapid change based on information released by regulatory bodies including the CDC and federal and state organizations. These policies and algorithms were followed during the patient's care in the ED.    As part of my medical decision making, I reviewed the following data within the electronic MEDICAL RECORD NUMBER Nursing notes reviewed and incorporated, Labs reviewed , Old chart reviewed, Radiograph reviewed ,  Notes from prior ED visits and [place] Controlled Substance Database  ____________________________________________   FINAL CLINICAL IMPRESSION(S) / ED DIAGNOSES  Final diagnoses:  Idiopathic gout, unspecified chronicity, unspecified site  Acute leg pain, right      NEW MEDICATIONS STARTED DURING THIS VISIT:  Discharge Medication List as of 12/01/2020 12:51 PM    START taking these medications   Details  carvedilol (COREG) 6.25 MG tablet Take 1 tablet (6.25 mg total) by mouth 2 (two) times daily., Starting Tue 12/01/2020, Until Wed 12/01/2021, Normal    colchicine 0.6 MG tablet Take 1 tablet (0.6 mg total) by mouth 2 (two) times daily., Starting Tue 12/01/2020, Until Wed 12/01/2021, Normal    HYDROcodone-acetaminophen (NORCO/VICODIN) 5-325 MG tablet Take 1 tablet by mouth every 6 (six) hours as needed for moderate pain., Starting Tue 12/01/2020, Normal         Note:  This document was prepared using Dragon voice recognition software and may  include unintentional dictation errors.    Faythe Ghee, PA-C 12/01/20 1321    Merwyn Katos, MD 12/06/20 [phone]

## 2021-02-01 ENCOUNTER — Emergency Department
Admission: EM | Admit: 2021-02-01 | Discharge: 2021-02-01 | Disposition: A | Discharge status: Home / Self Care | Payer: Self-pay | Attending: Emergency Medicine | Admitting: Emergency Medicine

## 2021-02-01 ENCOUNTER — Emergency Department: Payer: Self-pay

## 2021-02-01 ENCOUNTER — Other Ambulatory Visit: Payer: Self-pay

## 2021-02-01 DIAGNOSIS — W19XXXA Unspecified fall, initial encounter: Secondary | ICD-10-CM | POA: Insufficient documentation

## 2021-02-01 DIAGNOSIS — R52 Pain, unspecified: Secondary | ICD-10-CM | POA: Diagnosis not present

## 2021-02-01 DIAGNOSIS — F17210 Nicotine dependence, cigarettes, uncomplicated: Secondary | ICD-10-CM | POA: Insufficient documentation

## 2021-02-01 DIAGNOSIS — Z79899 Other long term (current) drug therapy: Secondary | ICD-10-CM | POA: Insufficient documentation

## 2021-02-01 DIAGNOSIS — M545 Low back pain, unspecified: Secondary | ICD-10-CM | POA: Diagnosis not present

## 2021-02-01 DIAGNOSIS — E119 Type 2 diabetes mellitus without complications: Secondary | ICD-10-CM | POA: Insufficient documentation

## 2021-02-01 DIAGNOSIS — J45909 Unspecified asthma, uncomplicated: Secondary | ICD-10-CM | POA: Insufficient documentation

## 2021-02-01 DIAGNOSIS — M5410 Radiculopathy, site unspecified: Secondary | ICD-10-CM

## 2021-02-01 DIAGNOSIS — I10 Essential (primary) hypertension: Secondary | ICD-10-CM | POA: Insufficient documentation

## 2021-02-01 DIAGNOSIS — M549 Dorsalgia, unspecified: Secondary | ICD-10-CM | POA: Diagnosis not present

## 2021-02-01 MED ORDER — ORPHENADRINE CITRATE ER 100 MG PO TB12: 100.0000 mg | ORAL_TABLET | Freq: Two times a day (BID) | ORAL | 0 refills | Status: DC

## 2021-02-01 MED ORDER — ORPHENADRINE CITRATE 30 MG/ML IJ SOLN
60.0000 mg | Freq: Two times a day (BID) | INTRAMUSCULAR | Status: DC
  Administered 2021-02-01: 60 mg via INTRAMUSCULAR
  Filled 2021-02-01: qty 2

## 2021-02-01 MED ORDER — OXYCODONE-ACETAMINOPHEN 7.5-325 MG PO TABS: 1.0000 | ORAL_TABLET | Freq: Four times a day (QID) | ORAL | 0 refills | Status: DC | PRN

## 2021-02-01 MED ORDER — HYDROMORPHONE HCL 1 MG/ML IJ SOLN
1.0000 mg | Freq: Once | INTRAMUSCULAR | Status: AC
Start: 2021-02-01 — End: 2021-02-01
  Administered 2021-02-01: 1 mg via INTRAMUSCULAR
  Filled 2021-02-01: qty 1

## 2021-02-01 NOTE — ED Notes (Signed)
D/C and new RX discussed with pt, pt verbalized understanding. NAD noted.  

## 2021-02-01 NOTE — ED Notes (Signed)
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 Pt states he did take a muscle relaxer (flexeril) without much relief. Pt denies HX of kidney stones. Pt denies loss of bowel or bladder control. Pt denies numbness or tingling. Pt is A&Ox4.

## 2021-02-01 NOTE — ED Provider Notes (Signed)
[facility] Emergency Department Provider Note   ____________________________________________   Event Date/Time   First MD Initiated Contact with Patient 02/01/21 [phone]     (approximate)  I have reviewed the triage vital signs and the nursing notes.   HISTORY  Chief Complaint Back Pain    HPI Aaron [name] is a 45 y.o. male patient prior via EMS complaining of back pain secondary to a fall which occurred 3 days ago.  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  Patient fell backwards landing on his buttocks.  States next day he started experiencing radicular pain down the right lower extremity.  Patient denies bladder or bowel dysfunction.  Rates pain as a 5/10.  Described pain as "achy".  No palliative measure for complaint.     Past Medical History:  Diagnosis Date   Asthma    Diabetes mellitus without complication (HCC)    Hypertension     Patient Active Problem List   Diagnosis Date Noted   Angioedema 04/20/2016    History reviewed. No pertinent surgical history.  Prior to Admission medications   Medication Sig Start Date End Date Taking? Authorizing Provider  orphenadrine (NORFLEX) 100 MG tablet Take 1 tablet (100 mg total) by mouth 2 (two) times daily. 02/01/21  Yes Joni Reining, PA-C  oxyCODONE-acetaminophen (PERCOCET) 7.5-325 MG tablet Take 1 tablet by mouth every 6 (six) hours as needed for severe pain. 02/01/21  Yes Joni Reining, PA-C  albuterol (VENTOLIN HFA) 108 (90 Base) MCG/ACT inhaler Inhale 2 puffs into the lungs every 6 (six) hours as needed for wheezing or shortness of breath. 12/01/20   Fisher, Roselyn Bering, PA-C  carvedilol (COREG) 6.25 MG tablet Take 1 tablet (6.25 mg total) by mouth 2 (two) times daily. 12/01/20 12/01/21  Fisher, Roselyn Bering, PA-C  colchicine 0.6 MG tablet Take 1 tablet (0.6 mg total) by mouth 2 (two) times daily. 12/01/20 12/01/21  Fisher, Roselyn Bering, PA-C  hydrochlorothiazide (HYDRODIURIL) 25 MG  tablet Take 1 tablet (25 mg total) by mouth daily. 12/01/20   Faythe Ghee, PA-C    Allergies Levaquin [levofloxacin]  No family history on file.  Social History Social History   Tobacco Use   Smoking status: Every Day    Pack years: 0.00    Types: Cigarettes   Smokeless tobacco: Never  Substance Use Topics   Alcohol use: Yes   Drug use: No    Review of Systems  Constitutional: No fever/chills Eyes: No visual changes. ENT: No sore throat. Cardiovascular: Denies chest pain. Respiratory: Denies shortness of breath. Gastrointestinal: No abdominal pain.  No nausea, no vomiting.  No diarrhea.  No constipation. Genitourinary: Negative for dysuria. Musculoskeletal: Positive for back pain. Skin: Negative for rash. Neurological: Negative for headaches, focal weakness or numbness. Endocrine: Diabetes and hypertension. Allergic/Immunilogical: Levaquin ____________________________________________   PHYSICAL EXAM:  VITAL SIGNS: ED Triage Vitals [02/01/21 0950]  Enc Vitals Group     BP (!) 177/121     Pulse Rate 91     Resp 20     Temp 98.7 F (37.1 C)     Temp Source Oral     SpO2 97 %     Weight      Height      Head Circumference      Peak Flow      Pain Score      Pain Loc      Pain Edu?      Excl.  in GC?     Constitutional: Alert and oriented. Well appearing and in no acute distress. Eyes: Conjunctivae are normal. PERRL. EOMI. Head: Atraumatic. Nose: No congestion/rhinnorhea. Mouth/Throat: Mucous membranes are moist.  Oropharynx non-erythematous. Neck: No stridor.  No cervical spine tenderness to palpation. Hematological/Lymphatic/Immunilogical: No cervical lymphadenopathy. Cardiovascular: Normal rate, regular rhythm. Grossly normal heart sounds.  Good peripheral circulation.  Elevated blood pressure.  Patient will take hypertension medicine today. Respiratory: Normal respiratory effort.  No retractions. Lungs CTAB. Gastrointestinal: Soft and nontender.  No distention. No abdominal bruits. No CVA tenderness. Musculoskeletal: No obvious lumbar spine deformity.  Patient is moderate guarding palpation of L3-S1.  No lower extremity tenderness nor edema.  No joint effusions. Neurologic:  Normal speech and language. No gross focal neurologic deficits are appreciated. No gait instability. Skin:  Skin is warm, dry and intact. No rash noted.  No abrasion or ecchymosis. Psychiatric: Mood and affect are normal. Speech and behavior are normal.  ____________________________________________   LABS (all labs ordered are listed, but only abnormal results are displayed)  Labs Reviewed - No data to display ____________________________________________  EKG   ____________________________________________  RADIOLOGY I, Joni Reining, personally viewed and evaluated these images (plain radiographs) as part of my medical decision making, as well as reviewing the written report by the radiologist.  ED MD interpretation: No acute findings on lumbar spine x-ray.  Official radiology report(s): DG Lumbar Spine Complete  Result Date: 02/01/2021 CLINICAL DATA:  Back pain with radicular symptoms, recent fall 3 days ago EXAM: LUMBAR SPINE - COMPLETE 4+ VIEW COMPARISON:  11/14/2015 FINDINGS: There is no evidence of lumbar spine fracture. Alignment is normal. Intervertebral disc spaces are maintained. IMPRESSION: No acute osseous finding or malalignment Electronically Signed   By: Judie Petit.  Shick M.D.   On: 02/01/2021 11:18    ____________________________________________   PROCEDURES  Procedure(s) performed (including Critical Care):  Procedures   ____________________________________________   INITIAL IMPRESSION / ASSESSMENT AND PLAN / ED COURSE  As part of my medical decision making, I reviewed the following data within the electronic MEDICAL RECORD NUMBER         Patient presents with radicular back pain secondary to a fall.  Discussed no acute findings on  x-ray of the lumbar spine.  Patient given discharge care instruction work note.  Patient [facility].  Take medication as directed.      ____________________________________________   FINAL CLINICAL IMPRESSION(S) / ED DIAGNOSES  Final diagnoses:  Radicular low back pain     ED Discharge Orders          Ordered    oxyCODONE-acetaminophen (PERCOCET) 7.5-325 MG tablet  Every 6 hours PRN        02/01/21 1132    orphenadrine (NORFLEX) 100 MG tablet  2 times daily        02/01/21 1132             Note:  This document was prepared using Dragon voice recognition software and may include unintentional dictation errors.    Joni Reining, PA-C 02/01/21 1135    Sharman Cheek, MD 02/02/21 [phone]

## 2021-02-01 NOTE — ED Triage Notes (Signed)
Pt comes into the ED via EMS from home, c/o fall on Friday and since having right lower back pain that radiates into the leg since.

## 2021-02-01 NOTE — Discharge Instructions (Addendum)
No acute findings on x-ray of the lumbar spine.  Read and follow discharge care instruction.  Take medication as directed.

## 2021-08-06 ENCOUNTER — Emergency Department
Admission: EM | Admit: 2021-08-06 | Discharge: 2021-08-06 | Disposition: A | Discharge status: Home / Self Care | Payer: Medicaid Other | Source: Home / Self Care

## 2021-09-23 IMAGING — CR DG LUMBAR SPINE COMPLETE 4+V
1 series · 5 of 5 positions shown · non-contrast
Comparison: 11/14/2015

CLINICAL DATA: Back pain with radicular symptoms, recent fall 3
days ago

EXAM:
LUMBAR SPINE - COMPLETE 4+ VIEW

[Series 1: dg lumbar spine complete 4 +v · 0.14mm/px · 5 of 5 slices shown]
[im 1/5]
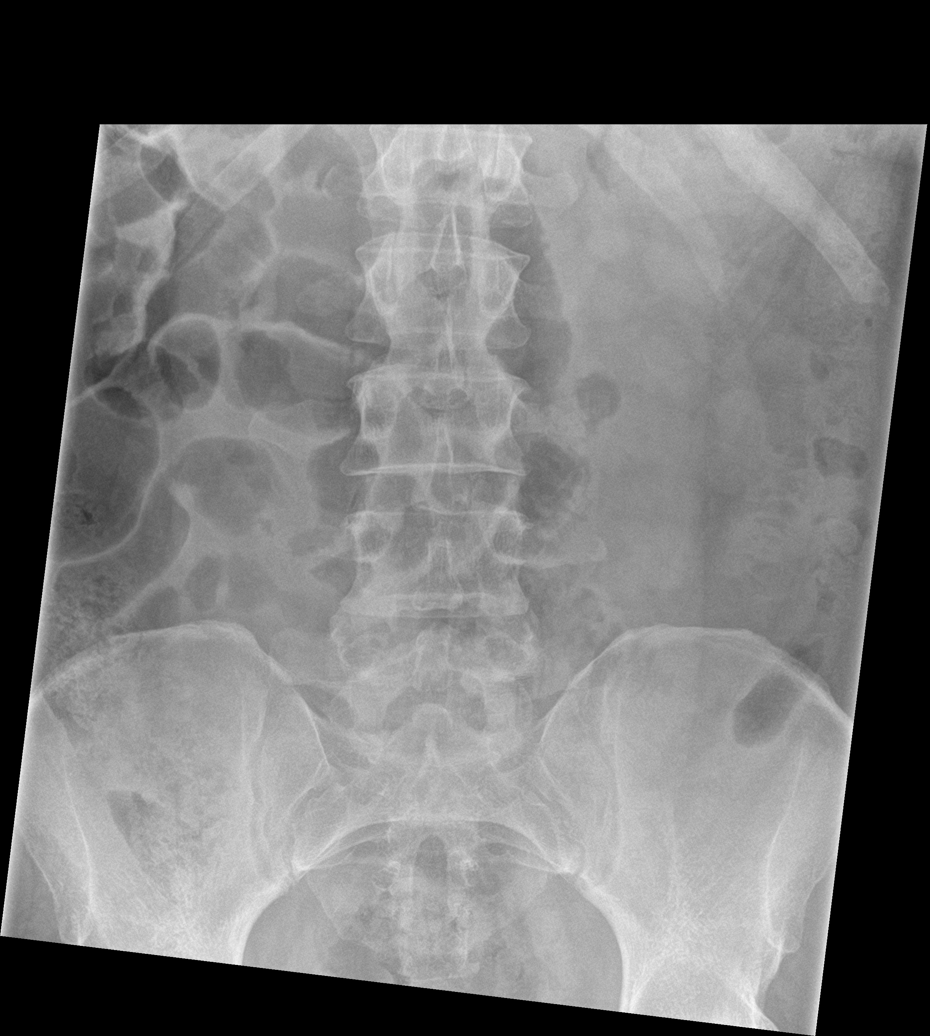
[im 2/5]
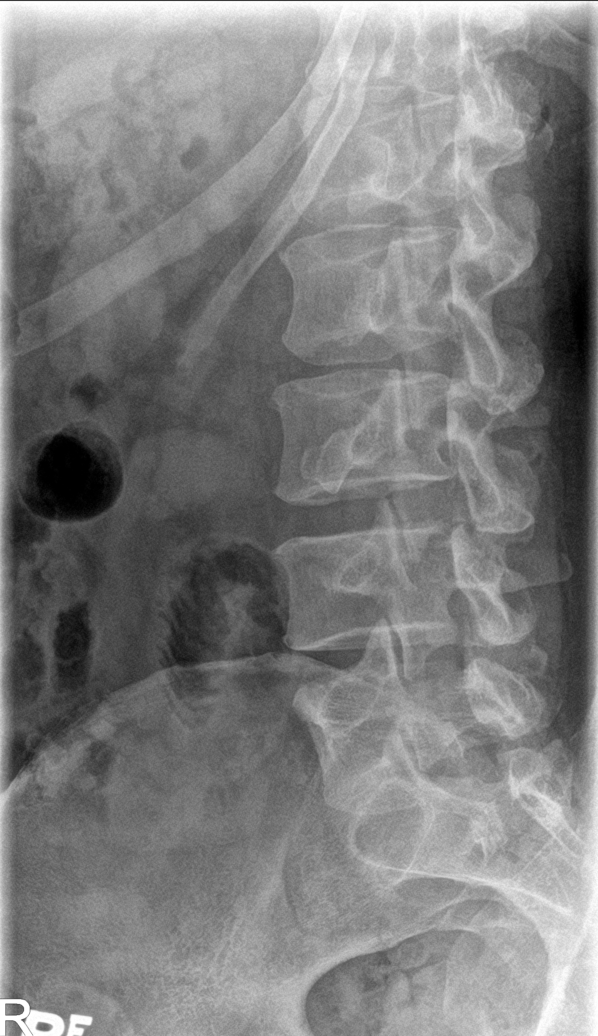
[im 3/5]
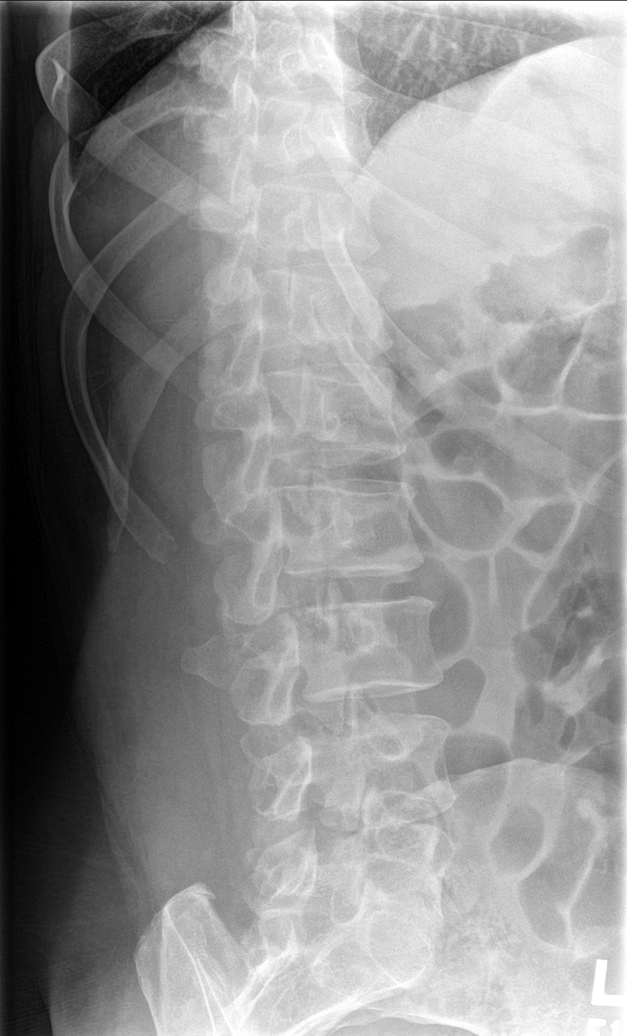
[im 4/5]
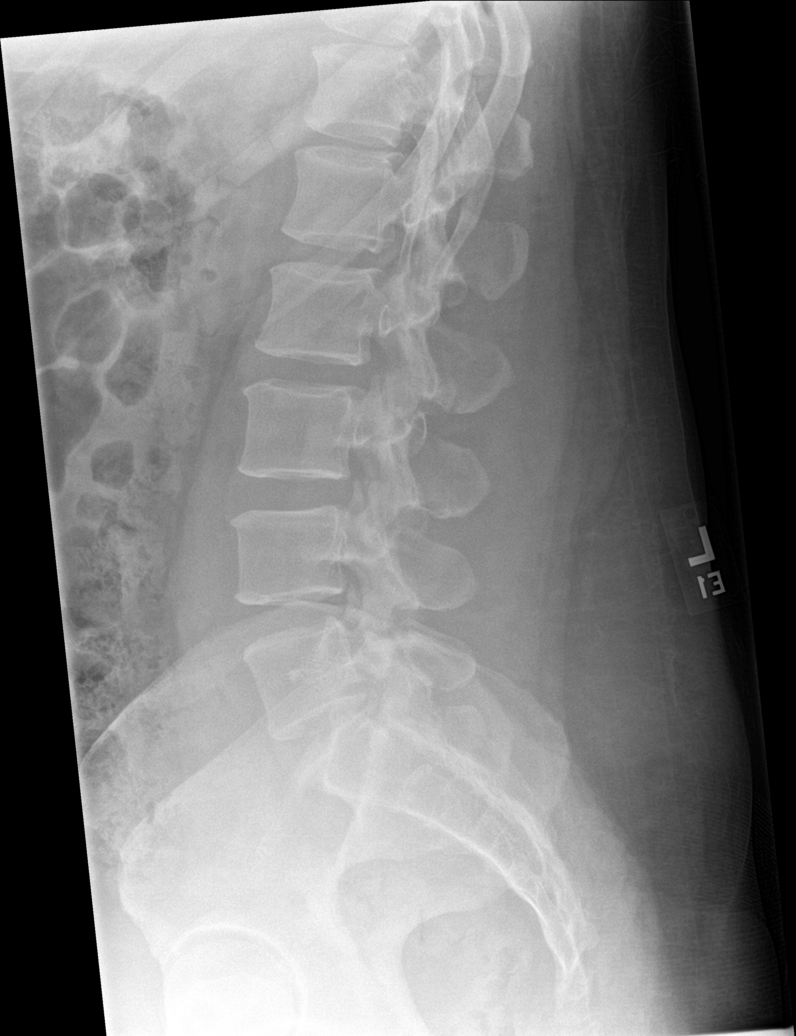
[im 5/5]
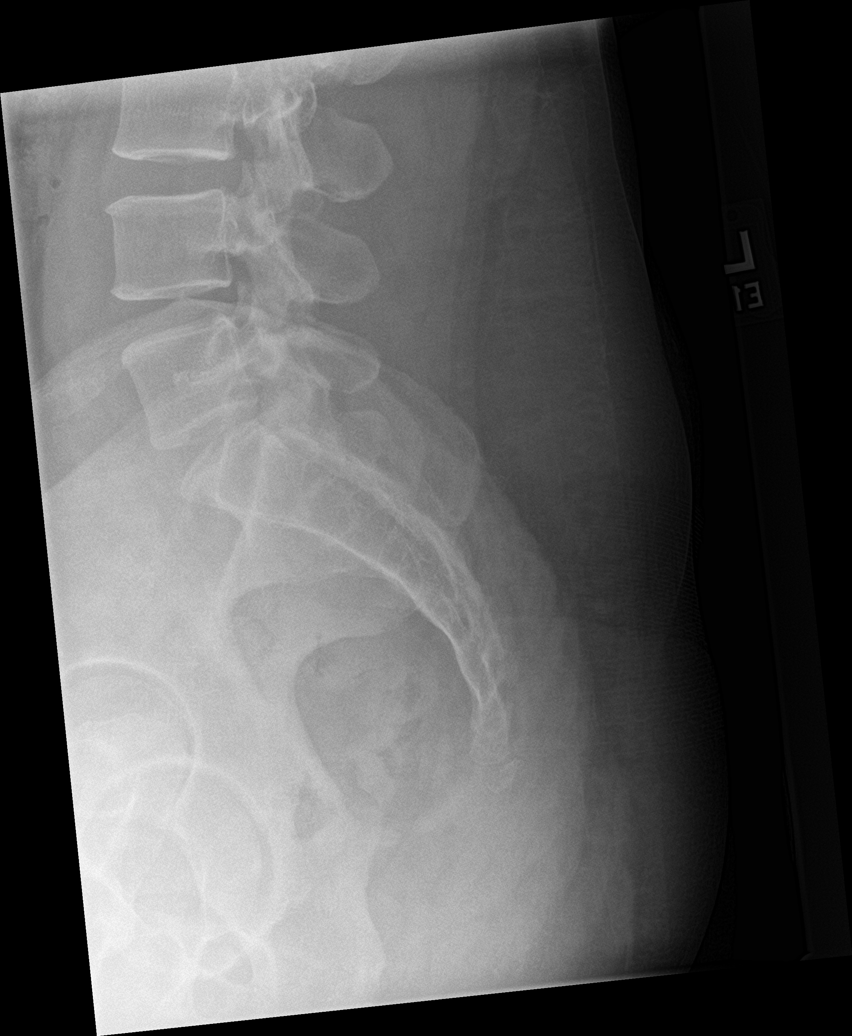

[5 of 5 positions shown; findings below may reference images not displayed]

FINDINGS: There is no evidence of lumbar spine fracture. Alignment is normal.
Intervertebral disc spaces are maintained.
IMPRESSION: No acute osseous finding or malalignment

## 2022-02-14 ENCOUNTER — Emergency Department: Payer: Medicaid Other

## 2022-02-14 ENCOUNTER — Inpatient Hospital Stay
Admission: EM | Admit: 2022-02-14 | Discharge: 2022-02-16 | DRG: 871 | Disposition: A | Discharge status: Home / Self Care | Payer: Medicaid Other | Attending: Internal Medicine | Admitting: Internal Medicine

## 2022-02-14 ENCOUNTER — Other Ambulatory Visit: Payer: Self-pay

## 2022-02-14 DIAGNOSIS — B20 Human immunodeficiency virus [HIV] disease: Secondary | ICD-10-CM | POA: Diagnosis present

## 2022-02-14 DIAGNOSIS — Z833 Family history of diabetes mellitus: Secondary | ICD-10-CM

## 2022-02-14 DIAGNOSIS — Z8249 Family history of ischemic heart disease and other diseases of the circulatory system: Secondary | ICD-10-CM

## 2022-02-14 DIAGNOSIS — A530 Latent syphilis, unspecified as early or late: Secondary | ICD-10-CM | POA: Diagnosis present

## 2022-02-14 DIAGNOSIS — E119 Type 2 diabetes mellitus without complications: Secondary | ICD-10-CM | POA: Diagnosis present

## 2022-02-14 DIAGNOSIS — A4151 Sepsis due to Escherichia coli [E. coli]: Principal | ICD-10-CM | POA: Diagnosis present

## 2022-02-14 DIAGNOSIS — Z21 Asymptomatic human immunodeficiency virus [HIV] infection status: Secondary | ICD-10-CM | POA: Diagnosis present

## 2022-02-14 DIAGNOSIS — E860 Dehydration: Secondary | ICD-10-CM | POA: Diagnosis present

## 2022-02-14 DIAGNOSIS — F17210 Nicotine dependence, cigarettes, uncomplicated: Secondary | ICD-10-CM | POA: Diagnosis present

## 2022-02-14 DIAGNOSIS — Z881 Allergy status to other antibiotic agents status: Secondary | ICD-10-CM

## 2022-02-14 DIAGNOSIS — N170 Acute kidney failure with tubular necrosis: Secondary | ICD-10-CM | POA: Diagnosis present

## 2022-02-14 DIAGNOSIS — I10 Essential (primary) hypertension: Secondary | ICD-10-CM

## 2022-02-14 DIAGNOSIS — N179 Acute kidney failure, unspecified: Secondary | ICD-10-CM

## 2022-02-14 DIAGNOSIS — Z823 Family history of stroke: Secondary | ICD-10-CM

## 2022-02-14 DIAGNOSIS — K529 Noninfective gastroenteritis and colitis, unspecified: Secondary | ICD-10-CM | POA: Diagnosis present

## 2022-02-14 DIAGNOSIS — E118 Type 2 diabetes mellitus with unspecified complications: Secondary | ICD-10-CM

## 2022-02-14 DIAGNOSIS — Z6836 Body mass index (BMI) 36.0-36.9, adult: Secondary | ICD-10-CM

## 2022-02-14 DIAGNOSIS — E871 Hypo-osmolality and hyponatremia: Secondary | ICD-10-CM | POA: Diagnosis present

## 2022-02-14 DIAGNOSIS — A419 Sepsis, unspecified organism: Secondary | ICD-10-CM

## 2022-02-14 DIAGNOSIS — E876 Hypokalemia: Secondary | ICD-10-CM

## 2022-02-14 DIAGNOSIS — E878 Other disorders of electrolyte and fluid balance, not elsewhere classified: Secondary | ICD-10-CM | POA: Diagnosis present

## 2022-02-14 DIAGNOSIS — Z91199 Patient's noncompliance with other medical treatment and regimen due to unspecified reason: Secondary | ICD-10-CM

## 2022-02-14 DIAGNOSIS — E669 Obesity, unspecified: Secondary | ICD-10-CM | POA: Diagnosis present

## 2022-02-14 DIAGNOSIS — J45909 Unspecified asthma, uncomplicated: Secondary | ICD-10-CM

## 2022-02-14 LAB — CBC WITH DIFFERENTIAL/PLATELET
Abs Immature Granulocytes: 0.05 10*3/uL (ref 0.00–0.07)
Basophils Absolute: 0.1 10*3/uL (ref 0.0–0.1)
Basophils Relative: 1 %
Eosinophils Absolute: 0 10*3/uL (ref 0.0–0.5)
Eosinophils Relative: 0 %
HCT: 47.3 % (ref 39.0–52.0)
Hemoglobin: 15.7 g/dL (ref 13.0–17.0)
Immature Granulocytes: 0 %
Lymphocytes Relative: 14 %
Lymphs Abs: 1.7 10*3/uL (ref 0.7–4.0)
MCH: 27.8 pg (ref 26.0–34.0)
MCHC: 33.2 g/dL (ref 30.0–36.0)
MCV: 83.7 fL (ref 80.0–100.0)
Monocytes Absolute: 1.3 10*3/uL — ABNORMAL HIGH (ref 0.1–1.0)
Monocytes Relative: 11 %
Neutro Abs: 8.9 10*3/uL — ABNORMAL HIGH (ref 1.7–7.7)
Neutrophils Relative %: 74 %
Platelets: 206 10*3/uL (ref 150–400)
RBC: 5.65 MIL/uL (ref 4.22–5.81)
RDW: 15 % (ref 11.5–15.5)
Smear Review: UNDETERMINED
WBC Morphology: INCREASED
WBC: 12 10*3/uL — ABNORMAL HIGH (ref 4.0–10.5)
nRBC: 0 % (ref 0.0–0.2)

## 2022-02-14 LAB — URINALYSIS, ROUTINE W REFLEX MICROSCOPIC
Bacteria, UA: NONE SEEN
Bilirubin Urine: NEGATIVE
Glucose, UA: NEGATIVE mg/dL
Ketones, ur: NEGATIVE mg/dL
Leukocytes,Ua: NEGATIVE
Nitrite: NEGATIVE
Protein, ur: 30 mg/dL — AB
RBC / HPF: >50 RBC/hpf — ABNORMAL HIGH (ref 0–5)
Specific Gravity, Urine: 1.019 (ref 1.005–1.030)
pH: 5 (ref 5.0–8.0)

## 2022-02-14 LAB — COMPREHENSIVE METABOLIC PANEL
ALT: 23 U/L (ref 0–44)
AST: 23 U/L (ref 15–41)
Albumin: 3.9 g/dL (ref 3.5–5.0)
Alkaline Phosphatase: 36 U/L — ABNORMAL LOW (ref 38–126)
Anion gap: 14 (ref 5–15)
BUN: 33 mg/dL — ABNORMAL HIGH (ref 6–20)
CO2: 20 mmol/L — ABNORMAL LOW (ref 22–32)
Calcium: 8.6 mg/dL — ABNORMAL LOW (ref 8.9–10.3)
Chloride: 96 mmol/L — ABNORMAL LOW (ref 98–111)
Creatinine, Ser: 3.38 mg/dL — ABNORMAL HIGH (ref 0.61–1.24)
GFR, Estimated: 22 mL/min — ABNORMAL LOW (ref >60)
Glucose, Bld: 103 mg/dL — ABNORMAL HIGH (ref 70–99)
Potassium: 3.3 mmol/L — ABNORMAL LOW (ref 3.5–5.1)
Sodium: 130 mmol/L — ABNORMAL LOW (ref 135–145)
Total Bilirubin: 1 mg/dL (ref 0.3–1.2)
Total Protein: 8.7 g/dL — ABNORMAL HIGH (ref 6.5–8.1)

## 2022-02-14 LAB — PROTIME-INR
INR: 1.2 (ref 0.8–1.2)
Prothrombin Time: 14.7 seconds (ref 11.4–15.2)

## 2022-02-14 LAB — APTT: aPTT: 31 seconds (ref 24–36)

## 2022-02-14 LAB — C DIFFICILE QUICK SCREEN W PCR REFLEX
C Diff antigen: NEGATIVE
C Diff interpretation: NOT DETECTED
C Diff toxin: NEGATIVE

## 2022-02-14 LAB — LIPASE, BLOOD: Lipase: 20 U/L (ref 11–51)

## 2022-02-14 LAB — LACTIC ACID, PLASMA: Lactic Acid, Venous: 1.6 mmol/L (ref 0.5–1.9)

## 2022-02-14 MED ORDER — TRAZODONE HCL 50 MG PO TABS: 25.0000 mg | ORAL_TABLET | Freq: Every evening | ORAL | Status: DC | PRN

## 2022-02-14 MED ORDER — SODIUM CHLORIDE 0.9 % IV SOLN
1.0000 g | Freq: Once | INTRAVENOUS | Status: DC
  Administered 2022-02-14: 1 g via INTRAVENOUS
  Filled 2022-02-14: qty 10

## 2022-02-14 MED ORDER — ORPHENADRINE CITRATE ER 100 MG PO TB12: 100.0000 mg | ORAL_TABLET | Freq: Two times a day (BID) | ORAL | Status: DC

## 2022-02-14 MED ORDER — SODIUM CHLORIDE 0.9 % IV SOLN
2.0000 g | INTRAVENOUS | Status: DC
  Administered 2022-02-15 (×2): 2 g via INTRAVENOUS
  Filled 2022-02-14: qty 2
  Filled 2022-02-14: qty 20
  Filled 2022-02-14: qty 2

## 2022-02-14 MED ORDER — ACETAMINOPHEN 325 MG PO TABS: 650.0000 mg | ORAL_TABLET | Freq: Four times a day (QID) | ORAL | Status: DC | PRN

## 2022-02-14 MED ORDER — CARVEDILOL 6.25 MG PO TABS
6.2500 mg | ORAL_TABLET | Freq: Two times a day (BID) | ORAL | Status: DC
  Administered 2022-02-15 – 2022-02-16 (×3): 6.25 mg via ORAL
  Filled 2022-02-14 (×4): qty 1

## 2022-02-14 MED ORDER — ALBUTEROL SULFATE (2.5 MG/3ML) 0.083% IN NEBU: 2.5000 mg | INHALATION_SOLUTION | Freq: Four times a day (QID) | RESPIRATORY_TRACT | Status: DC | PRN

## 2022-02-14 MED ORDER — ONDANSETRON HCL 4 MG/2ML IJ SOLN: 4.0000 mg | Freq: Four times a day (QID) | INTRAMUSCULAR | Status: DC | PRN

## 2022-02-14 MED ORDER — COLCHICINE 0.6 MG PO TABS
0.6000 mg | ORAL_TABLET | Freq: Two times a day (BID) | ORAL | Status: DC
  Administered 2022-02-15 – 2022-02-16 (×3): 0.6 mg via ORAL
  Filled 2022-02-14 (×3): qty 1

## 2022-02-14 MED ORDER — SODIUM CHLORIDE 0.9 % IV BOLUS
1000.0000 mL | Freq: Once | INTRAVENOUS | Status: AC
  Administered 2022-02-14: 1000 mL via INTRAVENOUS

## 2022-02-14 MED ORDER — OXYCODONE-ACETAMINOPHEN 7.5-325 MG PO TABS: 1.0000 | ORAL_TABLET | Freq: Four times a day (QID) | ORAL | Status: DC | PRN

## 2022-02-14 MED ORDER — METHOCARBAMOL 500 MG PO TABS: 500.0000 mg | ORAL_TABLET | Freq: Three times a day (TID) | ORAL | Status: DC | PRN

## 2022-02-14 MED ORDER — METRONIDAZOLE 500 MG/100ML IV SOLN
500.0000 mg | Freq: Two times a day (BID) | INTRAVENOUS | Status: DC
  Administered 2022-02-15: 500 mg via INTRAVENOUS
  Filled 2022-02-14 (×2): qty 100

## 2022-02-14 MED ORDER — ALBUTEROL SULFATE HFA 108 (90 BASE) MCG/ACT IN AERS: 2.0000 | INHALATION_SPRAY | Freq: Four times a day (QID) | RESPIRATORY_TRACT | Status: DC | PRN

## 2022-02-14 MED ORDER — METRONIDAZOLE 500 MG/100ML IV SOLN
500.0000 mg | Freq: Once | INTRAVENOUS | Status: DC
  Administered 2022-02-14: 500 mg via INTRAVENOUS
  Filled 2022-02-14: qty 100

## 2022-02-14 MED ORDER — ACETAMINOPHEN 650 MG RE SUPP: 650.0000 mg | Freq: Four times a day (QID) | RECTAL | Status: DC | PRN

## 2022-02-14 MED ORDER — POTASSIUM CHLORIDE IN NACL 20-0.9 MEQ/L-% IV SOLN
INTRAVENOUS | Status: DC
  Filled 2022-02-14 (×7): qty 1000

## 2022-02-14 MED ORDER — ONDANSETRON HCL 4 MG PO TABS: 4.0000 mg | ORAL_TABLET | Freq: Four times a day (QID) | ORAL | Status: DC | PRN

## 2022-02-14 MED ORDER — MORPHINE SULFATE (PF) 2 MG/ML IV SOLN: 2.0000 mg | INTRAVENOUS | Status: DC | PRN

## 2022-02-14 NOTE — Assessment & Plan Note (Signed)
Replace potassium and IV fluids.  Check magnesium and phosphorus tomorrow morning

## 2022-02-14 NOTE — ED Provider Triage Note (Signed)
Emergency Medicine Provider Triage Evaluation Note  Aaron [name] , a 46 y.o. male  was evaluated in triage.  Pt complains of diarrhea for 3 days. Right sided abdominal pain. Temp of 101F today. Was vomiting but this resolved. No blood in stool. No history of abdominal surgery. Reports he hasn't "peed in a couple of days.'   Review of Systems  Positive: Diarrhea, abdominal pain, fever Negative: vomiting  Physical Exam  There were no vitals taken for this visit. Gen:   Awake, no distress   Resp:  Normal effort  MSK:   Moves extremities without difficulty  Other:    Medical Decision Making  Medically screening exam initiated at 3:23 PM.  Appropriate orders placed.  Aaron [name] was informed that the remainder of the evaluation will be completed by another provider, this initial triage assessment does not replace that evaluation, and the importance of remaining in the ED until their evaluation is complete.     Aaron Hoehn, PA-C 02/14/22 1528

## 2022-02-14 NOTE — Assessment & Plan Note (Signed)
-   Management as above. - We will follow blood cultures as well as stool culture.

## 2022-02-14 NOTE — Assessment & Plan Note (Addendum)
DUE TO ATN. Admission creatinine 3.38 and improved on IV fluids.  Cr today was 1.41.  Treated with IV fluids.  Baseline creatinine in 2022 was 1.18 with a GFR greater than 60. --Pt tolerating PO intake, adequately hydrating orally --Repeat BMP in 1 week

## 2022-02-14 NOTE — ED Triage Notes (Signed)
Pt to ED via [facility] frome home. Pt reports N/V/D and abdominal pain x3 days. Pt also reports inability to pee x3 days and fever at home of 101.9. Pt given 325mg  of tylenol with EMS  EMS VS: CBG 109 HR 105 BP 127/86 Temp 101.5

## 2022-02-14 NOTE — Assessment & Plan Note (Addendum)
Enteroaggregate of E. coli growing out of stool studies.  Treated with Rocephin, antibiotics completed during admission.  Was initially also on Zithromax and Flagyl which were stopped once GI panel resulted.

## 2022-02-14 NOTE — Assessment & Plan Note (Addendum)
Lungs clear at this point time

## 2022-02-14 NOTE — Assessment & Plan Note (Signed)
Continue Coreg and hold off HCTZ given AKI.

## 2022-02-14 NOTE — Assessment & Plan Note (Signed)
The patient will be placed on supplement coverage with NovoLog 

## 2022-02-14 NOTE — ED Notes (Addendum)
Unable to obtain IV access after 5 unsuccessful attempts. IV team consult placed.

## 2022-02-14 NOTE — H&P (Signed)
[place]   PATIENT NAME: Aaron [name]    MR#:  [id number]  DATE OF BIRTH:  06/17/1976  DATE OF ADMISSION:  02/14/2022  PRIMARY CARE PHYSICIAN: Pcp, No   Patient is coming from: Home  REQUESTING/REFERRING PHYSICIAN: Willy Eddy, MD  CHIEF COMPLAINT:   Chief Complaint  Patient presents with   Abdominal Pain    N/V/D    HISTORY OF PRESENT ILLNESS:  Aaron [name] is a 46 y.o. African-American male with medical history significant for asthma, type 2 diabetes mellitus and hypertension, who presented to the emergency room with acute onset of intractable diarrhea for the last 3 days without melena or bright red bleeding per rectum.  He has been having associated nausea or vomiting as well as fever and chills with abdominal pain.  He has been having as many as 15 episodes of diarrhea per day.  He denied taking any recent antibiotics.  No chest pain or dyspnea or cough or wheezing.  No dysuria, oliguria or hematuria or flank pain.  ED Course: Upon presentation to the emergency room, heart rate was 116 with temperature of 99.7 otherwise normal vital signs.  Labs revealed hyponatremia and hypochloremia with a potassium at 3.3 and CO2 of 20 with a BUN of 33 and creatinine 3.38 compared to 16/1.18 on 12/01/2020, calcium 8.6 and to obtain 8.7.  CBC showed leukocytosis of 12 with neutrophilia.  UA showed 6-10 WBCs and more than 50 RBCs with hyaline casts, 30 protein and negative glucose.  Imaging: Abdominal pelvic CT scan revealed diffuse colitis favoring infection versus inflammatory bowel disease/ulcerative colitis and it showed hepatic steatosis.  The patient was given 1 g of IV Rocephin and 500 mg IV Flagyl.  He will be hydrated with IV normal saline.  Will admitted to medical telemetry bed for further evaluation and management. PAST MEDICAL HISTORY:   Past Medical History:  Diagnosis Date   Asthma    Diabetes mellitus without complication (HCC)    Hypertension      PAST SURGICAL HISTORY:  He denies any previous surgeries.  SOCIAL HISTORY:   Social History   Tobacco Use   Smoking status: Every Day    Types: Cigarettes   Smokeless tobacco: Never  Substance Use Topics   Alcohol use: Yes    FAMILY HISTORY:  History reviewed. No pertinent family history.  Positive for MI, cancer, diabetes mellitus, hypertension and CVA.  DRUG ALLERGIES:   Allergies  Allergen Reactions   Levaquin [Levofloxacin] Itching and Rash    REVIEW OF SYSTEMS:   ROS As per history of present illness. All pertinent systems were reviewed above. Constitutional, HEENT, cardiovascular, respiratory, GI, GU, musculoskeletal, neuro, psychiatric, endocrine, integumentary and hematologic systems were reviewed and are otherwise negative/unremarkable except for positive findings mentioned above in the HPI.   MEDICATIONS AT HOME:   Prior to Admission medications   Medication Sig Start Date End Date Taking? Authorizing Provider  albuterol (VENTOLIN HFA) 108 (90 Base) MCG/ACT inhaler Inhale 2 puffs into the lungs every 6 (six) hours as needed for wheezing or shortness of breath. 12/01/20   Fisher, Roselyn Bering, PA-C  carvedilol (COREG) 6.25 MG tablet Take 1 tablet (6.25 mg total) by mouth 2 (two) times daily. 12/01/20 12/01/21  Fisher, Roselyn Bering, PA-C  colchicine 0.6 MG tablet Take 1 tablet (0.6 mg total) by mouth 2 (two) times daily. 12/01/20 12/01/21  Fisher, Roselyn Bering, PA-C  hydrochlorothiazide (HYDRODIURIL) 25 MG tablet Take 1 tablet (25 mg total) by  mouth daily. 12/01/20   Fisher, Roselyn Bering, PA-C  orphenadrine (NORFLEX) 100 MG tablet Take 1 tablet (100 mg total) by mouth 2 (two) times daily. 02/01/21   Joni Reining, PA-C  oxyCODONE-acetaminophen (PERCOCET) 7.5-325 MG tablet Take 1 tablet by mouth every 6 (six) hours as needed for severe pain. 02/01/21   Joni Reining, PA-C      VITAL SIGNS:  Blood pressure 123/85, pulse 98, temperature 99.2 F (37.3 C), resp. rate 20, height 6'  (1.829 m), weight 111.1 kg, SpO2 100 %.  PHYSICAL EXAMINATION:  Physical Exam  GENERAL:  46 y.o.-year-old patient lying in the bed with no acute distress.  EYES: Pupils equal, round, reactive to light and accommodation. No scleral icterus. Extraocular muscles intact.  HEENT: Head atraumatic, normocephalic. Oropharynx and nasopharynx clear.  NECK:  Supple, no jugular venous distention. No thyroid enlargement, no tenderness.  LUNGS: Normal breath sounds bilaterally, no wheezing, rales,rhonchi or crepitation. No use of accessory muscles of respiration.  CARDIOVASCULAR: Regular rate and rhythm, S1, S2 normal. No murmurs, rubs, or gallops.  ABDOMEN: Soft, nondistended, nontender. Bowel sounds present. No organomegaly or mass.  EXTREMITIES: No pedal edema, cyanosis, or clubbing.  NEUROLOGIC: Cranial nerves II through XII are intact. Muscle strength 5/5 in all extremities. Sensation intact. Gait not checked.  PSYCHIATRIC: The patient is alert and oriented x 3.  Normal affect and good eye contact. SKIN: No obvious rash, lesion, or ulcer.   LABORATORY PANEL:   CBC Recent Labs  Lab 02/14/22 1614  WBC 12.0*  HGB 15.7  HCT 47.3  PLT 206   ------------------------------------------------------------------------------------------------------------------  Chemistries  Recent Labs  Lab 02/14/22 1614  NA 130*  K 3.3*  CL 96*  CO2 20*  GLUCOSE 103*  BUN 33*  CREATININE 3.38*  CALCIUM 8.6*  AST 23  ALT 23  ALKPHOS 36*  BILITOT 1.0   ------------------------------------------------------------------------------------------------------------------  Cardiac Enzymes No results for input(s): "TROPONINI" in the last 168 hours. ------------------------------------------------------------------------------------------------------------------  RADIOLOGY:  CT ABDOMEN PELVIS WO CONTRAST  Result Date: 02/14/2022 CLINICAL DATA:  Nausea vomiting and diarrhea. Abdominal pain for 3 days.  Inability to urinate for 3 days. Fever. EXAM: CT ABDOMEN AND PELVIS WITHOUT CONTRAST TECHNIQUE: Multidetector CT imaging of the abdomen and pelvis was performed following the standard protocol without IV contrast. RADIATION DOSE REDUCTION: This exam was performed according to the departmental dose-optimization program which includes automated exposure control, adjustment of the mA and/or kV according to patient size and/or use of iterative reconstruction technique. COMPARISON:  Renal ultrasound 11/22/2006. No prior CT. FINDINGS: Lower chest: Clear lung bases. Normal heart size without pericardial or pleural effusion. Hepatobiliary: Moderate hepatic steatosis. Normal gallbladder, without biliary ductal dilatation. Pancreas: Normal, without mass or ductal dilatation. Spleen: Normal in size, without focal abnormality. Adrenals/Urinary Tract: Normal adrenal glands. No renal calculi or hydronephrosis. No hydroureter or ureteric calculi. No bladder calculi. Stomach/Bowel: Normal stomach, without wall thickening. Diffuse mild colonic wall thickening with pericolonic edema which is most apparent adjacent the transverse colon including on 30/2. Normal terminal ileum and appendix.  Normal small bowel. Vascular/Lymphatic: Normal caliber of the aorta and branch vessels. No abdominopelvic adenopathy. Reproductive: Normal prostate. Other: No significant free fluid.  No free intraperitoneal air. Musculoskeletal: No sacroiliitis.  Disc bulge at L4-5 is mild. IMPRESSION: 1. Diffuse colitis. Distribution and demographics favor infection versus inflammatory bowel disease/ulcerative colitis. 2.  No urinary tract calculi or hydronephrosis. 3. Hepatic steatosis Electronically Signed   By: Jeronimo Greaves M.D.   On: 02/14/2022 17:35  IMPRESSION AND PLAN:  Assessment and Plan: * Acute colitis - The patient will be admitted to a medical telemetry bed. - Differential diagnoses include infectious etiology and inflammatory bowel  disease. - He will be placed on IV Rocephin and Flagyl. - He will be hydrated with IV normal saline with added potassium chloride. - Pain management to be provided. - We will follow stool pathogens and C. difficile. - GI consultation can be sought if he has no improvement.  Sepsis due to undetermined organism (HCC) - Management as above. - We will follow blood cultures as well as stool culture.  AKI (acute kidney injury) (HCC) - This is likely prerenal due to volume depletion and dehydration from intractable diarrhea. - We will continue hydration with IV normal saline and follow BMP  Hypokalemia - This likely secondary to intractable diarrhea. - Potassium replaced and magnesium level will be checked.  Type 2 diabetes mellitus with complication, without long-term current use of insulin (HCC) The patient will be placed on supplement coverage with NovoLog.  Asthma, chronic - She will be placed on as needed DuoNebs.  Essential hypertension - We will continue Coreg and hold off HCTZ given AKI.   DVT prophylaxis: SCDs.  Due to significant colitis the patient is considered high risk for bleeding. Advanced Care Planning:  Code Status: full code.  Family Communication:  The plan of care was discussed in details with the patient (and family). I answered all questions. The patient agreed to proceed with the above mentioned plan. Further management will depend upon hospital course. Disposition Plan: Back to previous home environment Consults called: none.  All the records are reviewed and case discussed with ED provider.  Status is: Inpatient   At the time of the admission, it appears that the appropriate admission status for this patient is inpatient.  This is judged to be reasonable and necessary in order to provide the required intensity of service to ensure the patient's safety given the presenting symptoms, physical exam findings and initial radiographic and laboratory data in the  context of comorbid conditions.  The patient requires inpatient status due to high intensity of service, high risk of further deterioration and high frequency of surveillance required.  I certify that at the time of admission, it is my clinical judgment that the patient will require inpatient hospital care extending more than 2 midnights.                            Dispo: The patient is from: Home              Anticipated d/c is to: Home              Patient currently is not medically stable to d/c.              Difficult to place patient: No  Hannah Beat M.D on 02/14/2022 at 9:44 PM  [facility]   From 7 PM-7 AM, contact night-coverage [email or web address]  CC: Primary care physician; Pcp, No

## 2022-02-14 NOTE — ED Provider Notes (Signed)
[facility] Provider Note    Event Date/Time   First MD Initiated Contact with Patient 02/14/22 1948     (approximate)   History   Abdominal Pain (N/V/D)   HPI  Aaron [name] is a 46 y.o. male significant past medical history presents to the ER for evaluation of 3 days of nausea vomiting nonbloody diarrhea.  No history of same.  Is having fevers and chills with pain.  States that over the past 48 hours has had more than 15 episodes of diarrhea each day.  No recent antibiotics.  Does have an allergy to Levaquin.  Denies any history of renal disease or diabetes.     Physical Exam   Triage Vital Signs: ED Triage Vitals  Enc Vitals Group     BP 02/14/22 1526 128/81     Pulse Rate 02/14/22 1526 (!) 116     Resp 02/14/22 1526 18     Temp 02/14/22 1526 99.7 F (37.6 C)     Temp src --      SpO2 02/14/22 1526 98 %     Weight 02/14/22 1527 245 lb (111.1 kg)     Height 02/14/22 1527 6' (1.829 m)     Head Circumference --      Peak Flow --      Pain Score 02/14/22 1527 7     Pain Loc --      Pain Edu? --      Excl. in GC? --     Most recent vital signs: Vitals:   02/14/22 1526  BP: 128/81  Pulse: (!) 116  Resp: 18  Temp: 99.7 F (37.6 C)  SpO2: 98%     Constitutional: Alert  Eyes: Conjunctivae are normal.  Head: Atraumatic. Nose: No congestion/rhinnorhea. Mouth/Throat: Mucous membranes are moist.   Neck: Painless ROM.  Cardiovascular:   Good peripheral circulation. Respiratory: Normal respiratory effort.  No retractions.  Gastrointestinal: Soft mild tenderness to palpation. Musculoskeletal:  no deformity Neurologic:  MAE spontaneously. No gross focal neurologic deficits are appreciated.  Skin:  Skin is warm, dry and intact. No rash noted. Psychiatric: Mood and affect are normal. Speech and behavior are normal.    ED Results / Procedures / Treatments   Labs (all labs ordered are listed, but only abnormal results are  displayed) Labs Reviewed  CBC WITH DIFFERENTIAL/PLATELET - Abnormal; Notable for the following components:      Result Value   WBC 12.0 (*)    Neutro Abs 8.9 (*)    Monocytes Absolute 1.3 (*)    All other components within normal limits  COMPREHENSIVE METABOLIC PANEL - Abnormal; Notable for the following components:   Sodium 130 (*)    Potassium 3.3 (*)    Chloride 96 (*)    CO2 20 (*)    Glucose, Bld 103 (*)    BUN 33 (*)    Creatinine, Ser 3.38 (*)    Calcium 8.6 (*)    Total Protein 8.7 (*)    Alkaline Phosphatase 36 (*)    GFR, Estimated 22 (*)    All other components within normal limits  URINALYSIS, ROUTINE W REFLEX MICROSCOPIC - Abnormal; Notable for the following components:   Color, Urine AMBER (*)    APPearance CLOUDY (*)    Hgb urine dipstick LARGE (*)    Protein, ur 30 (*)    RBC / HPF >50 (*)    All other components within normal limits  GASTROINTESTINAL PANEL BY PCR,  STOOL (REPLACES STOOL CULTURE)  C DIFFICILE QUICK SCREEN W PCR REFLEX    CULTURE, BLOOD (ROUTINE X 2)  CULTURE, BLOOD (ROUTINE X 2)  LIPASE, BLOOD  LACTIC ACID, PLASMA  LACTIC ACID, PLASMA     EKG     RADIOLOGY Please see ED Course for my review and interpretation.  I personally reviewed all radiographic images ordered to evaluate for the above acute complaints and reviewed radiology reports and findings.  These findings were personally discussed with the patient.  Please see medical record for radiology report.    PROCEDURES:  Critical Care performed: yes  .Critical Care  Performed by: Willy Eddy, MD Authorized by: Willy Eddy, MD   Critical care provider statement:    Critical care time (minutes):  15   Critical care was necessary to treat or prevent imminent or life-threatening deterioration of the following conditions:  Dehydration   Critical care was time spent personally by me on the following activities:  Ordering and performing treatments and interventions,  ordering and review of laboratory studies, ordering and review of radiographic studies, pulse oximetry, re-evaluation of patient's condition, review of old charts, obtaining history from patient or surrogate, examination of patient, evaluation of patient's response to treatment, discussions with primary provider, discussions with consultants and development of treatment plan with patient or surrogate    MEDICATIONS ORDERED IN ED: Medications  sodium chloride 0.9 % bolus 1,000 mL (has no administration in time range)  cefTRIAXone (ROCEPHIN) 1 g in sodium chloride 0.9 % 100 mL IVPB (has no administration in time range)  metroNIDAZOLE (FLAGYL) IVPB 500 mg (has no administration in time range)     IMPRESSION / MDM / ASSESSMENT AND PLAN / ED COURSE  I reviewed the triage vital signs and the nursing notes.                              Differential diagnosis includes, but is not limited to, enteritis, lightest, SBO, diverticulitis, dehydration, electrolyte abnormality, CKD, AKI  Patient presented to ER for evaluation of symptoms as described above.  Patient with abdominal tenderness on exam therefore CT imaging ordered for the blood differential.  This presenting complaint could reflect a potentially life-threatening illness therefore the patient will be placed on continuous pulse oximetry and telemetry for monitoring.  Laboratory evaluation will be sent to evaluate for the above complaints.      Clinical Course as of 02/14/22 1957  Mon Feb 14, 2022  1954 ET imaging on my review and interpretation does not show any evidence of obstruction.  Does show evidence of diffuse colitis.  Patient has evidence of significant AKI with mild leukocytosis.  Given the amount of diarrhea he has been having essentially anuric we will give IV fluids for dehydration.  Given his fevers and chills will order Rocephin and Flagyl for colitis.  We will send stool studies.  Based on his presentation I do believe he  requires hospitalization for further work-up and management. [PR]    Clinical Course User Index [PR] Willy Eddy, MD     FINAL CLINICAL IMPRESSION(S) / ED DIAGNOSES   Final diagnoses:  Colitis  AKI (acute kidney injury) (HCC)  Dehydration     Rx / DC Orders   ED Discharge Orders     None        Note:  This document was prepared using Dragon voice recognition software and may include unintentional dictation errors.    Roxan Hockey,  Luisa Hart, MD 02/14/22 [phone]

## 2022-02-15 DIAGNOSIS — E669 Obesity, unspecified: Secondary | ICD-10-CM

## 2022-02-15 DIAGNOSIS — Z9189 Other specified personal risk factors, not elsewhere classified: Secondary | ICD-10-CM

## 2022-02-15 DIAGNOSIS — R6520 Severe sepsis without septic shock: Secondary | ICD-10-CM

## 2022-02-15 DIAGNOSIS — A4151 Sepsis due to Escherichia coli [E. coli]: Secondary | ICD-10-CM | POA: Diagnosis present

## 2022-02-15 DIAGNOSIS — J45909 Unspecified asthma, uncomplicated: Secondary | ICD-10-CM

## 2022-02-15 DIAGNOSIS — E118 Type 2 diabetes mellitus with unspecified complications: Secondary | ICD-10-CM

## 2022-02-15 LAB — CBC
HCT: 42.6 % (ref 39.0–52.0)
Hemoglobin: 14.2 g/dL (ref 13.0–17.0)
MCH: 28.2 pg (ref 26.0–34.0)
MCHC: 33.3 g/dL (ref 30.0–36.0)
MCV: 84.7 fL (ref 80.0–100.0)
Platelets: 236 10*3/uL (ref 150–400)
RBC: 5.03 MIL/uL (ref 4.22–5.81)
RDW: 15.1 % (ref 11.5–15.5)
WBC: 10.1 10*3/uL (ref 4.0–10.5)
nRBC: 0 % (ref 0.0–0.2)

## 2022-02-15 LAB — GASTROINTESTINAL PANEL BY PCR, STOOL (REPLACES STOOL CULTURE)
Adenovirus F40/41: NOT DETECTED
Astrovirus: NOT DETECTED
Campylobacter species: NOT DETECTED
Cryptosporidium: NOT DETECTED
Cyclospora cayetanensis: NOT DETECTED
Entamoeba histolytica: NOT DETECTED
Enteroaggregative E coli (EAEC): DETECTED — AB
Enteropathogenic E coli (EPEC): NOT DETECTED
Enterotoxigenic E coli (ETEC): NOT DETECTED
Giardia lamblia: NOT DETECTED
Norovirus GI/GII: NOT DETECTED
Plesimonas shigelloides: NOT DETECTED
Rotavirus A: NOT DETECTED
Salmonella species: NOT DETECTED
Sapovirus (I, II, IV, and V): NOT DETECTED
Shiga like toxin producing E coli (STEC): NOT DETECTED
Shigella/Enteroinvasive E coli (EIEC): DETECTED — AB
Vibrio cholerae: NOT DETECTED
Vibrio species: NOT DETECTED
Yersinia enterocolitica: NOT DETECTED

## 2022-02-15 LAB — LACTIC ACID, PLASMA
Lactic Acid, Venous: 1.1 mmol/L (ref 0.5–1.9)
Lactic Acid, Venous: 1.2 mmol/L (ref 0.5–1.9)
Lactic Acid, Venous: 1.2 mmol/L (ref 0.5–1.9)

## 2022-02-15 LAB — BASIC METABOLIC PANEL
Anion gap: 10 (ref 5–15)
BUN: 33 mg/dL — ABNORMAL HIGH (ref 6–20)
CO2: 24 mmol/L (ref 22–32)
Calcium: 8.1 mg/dL — ABNORMAL LOW (ref 8.9–10.3)
Chloride: 101 mmol/L (ref 98–111)
Creatinine, Ser: 2.56 mg/dL — ABNORMAL HIGH (ref 0.61–1.24)
GFR, Estimated: 30 mL/min — ABNORMAL LOW (ref >60)
Glucose, Bld: 106 mg/dL — ABNORMAL HIGH (ref 70–99)
Potassium: 3 mmol/L — ABNORMAL LOW (ref 3.5–5.1)
Sodium: 135 mmol/L (ref 135–145)

## 2022-02-15 LAB — HEMOGLOBIN A1C
Hgb A1c MFr Bld: 6.5 % — ABNORMAL HIGH (ref 4.8–5.6)
Mean Plasma Glucose: 139.85 mg/dL

## 2022-02-15 LAB — HIV ANTIBODY (ROUTINE TESTING W REFLEX): HIV Screen 4th Generation wRfx: REACTIVE — AB

## 2022-02-15 MED ORDER — ORAL CARE MOUTH RINSE
15.0000 mL | OROMUCOSAL | Status: DC | PRN
Start: 2022-02-16 — End: 2022-02-16

## 2022-02-15 NOTE — Assessment & Plan Note (Addendum)
Enteroaggregate of E. coli growing out of stool culture.  On Rocephin.

## 2022-02-15 NOTE — Consult Note (Addendum)
NAME: Aaron [name]  DOB: 1976/02/20  MRN: [id number]  Date/Time: 02/15/2022 6:58 PM  REQUESTING PROVIDER: Renae Gloss Subjective:  REASON FOR CONSULT: HIV test positive ?auto consult for viginaz alert for HIV test being positive Aaron [name] is a 46 y.o. 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 presented with diarrhea since Friday Fever and abdominal pain- also had vomiting for a day but that had resolved HE denies any blood in stool HE was not making enough urine The diarrhea was about 20 times Does not know whether it is due to somehting he ate- but non else is sick In the ED  BP 145/93, Temp 98 and pulse 95 WBC 12, Cr 206, HB 15.7 Cr 3.38  CT abdomen without contrast showed diffuse colitis Stool GI panel came back positive for EAEC and Shigella / Enteroinvasive e.coli- was started on Iv ceftriaxone  I am seeing the patient as the preliminary HIV test came back positive PT has risk for HIV- he has unprotected sex with men. HE says his last HIV test was many years ago and it was negative He is not aware of being exposed to known HIV positive partner.   Past Medical History:  Diagnosis Date   Asthma    Diabetes mellitus without complication (HCC)    Hypertension     History reviewed. No pertinent surgical history.  Social History   Socioeconomic History   Marital status: Single    Spouse name: Not on file   Number of children: Not on file   Years of education: Not on file   Highest education level: Not on file  Occupational History   Not on file  Tobacco Use   Smoking status: Every Day    Types: Cigarettes   Smokeless tobacco: Never  Substance and Sexual Activity   Alcohol use: Yes   Drug use: No   Sexual activity: Not on file  Other Topics Concern   Not on file  Social History Narrative   Not on file   Social Determinants of Health   Financial Resource Strain: Not on file  Food Insecurity: Not on file   Transportation Needs: Not on file  Physical Activity: Not on file  Stress: Not on file  Social Connections: Not on file  Intimate Partner Violence: Not on file    History reviewed. No pertinent family history. Allergies  Allergen Reactions   Levaquin [Levofloxacin] Itching and Rash   I? Current Facility-Administered Medications  Medication Dose Route Frequency Provider Last Rate Last Admin   0.9 % NaCl with KCl 20 mEq/ L  infusion   Intravenous Continuous Mansy, Jan A, MD   Stopped at 02/15/22 1534   acetaminophen (TYLENOL) tablet 650 mg  650 mg Oral Q6H PRN Mansy, Jan A, MD       Or   acetaminophen (TYLENOL) suppository 650 mg  650 mg Rectal Q6H PRN Mansy, Jan A, MD       albuterol (PROVENTIL) (2.5 MG/3ML) 0.083% nebulizer solution 2.5 mg  2.5 mg Nebulization Q6H PRN Beers, Sherlynn Carbon, RPH       carvedilol (COREG) tablet 6.25 mg  6.25 mg Oral BID Mansy, Jan A, MD   6.25 mg at 02/15/22 0931   cefTRIAXone (ROCEPHIN) 2 g in sodium chloride 0.9 % 100 mL IVPB  2 g Intravenous Q24H Mansy, Jan A, MD   Stopped at 02/15/22 0105   colchicine tablet 0.6 mg  0.6 mg  Oral BID Mansy, Jan A, MD   0.6 mg at 02/15/22 5638   methocarbamol (ROBAXIN) tablet 500 mg  500 mg Oral Q8H PRN Mansy, Jan A, MD       morphine (PF) 2 MG/ML injection 2 mg  2 mg Intravenous Q4H PRN Mansy, Jan A, MD       ondansetron [facility]) tablet 4 mg  4 mg Oral Q6H PRN Mansy, Jan A, MD       Or   ondansetron [facility]) injection 4 mg  4 mg Intravenous Q6H PRN Mansy, Vernetta Honey, MD       [START ON 02/16/2022] Oral care mouth rinse  15 mL Mouth Rinse PRN Mansy, Vernetta Honey, MD       oxyCODONE-acetaminophen (PERCOCET) 7.5-325 MG per tablet 1 tablet  1 tablet Oral Q6H PRN Mansy, Jan A, MD       traZODone (DESYREL) tablet 25 mg  25 mg Oral QHS PRN Mansy, Vernetta Honey, MD         Abtx:  Anti-infectives (From admission, onward)    Start     Dose/Rate Route Frequency Ordered Stop   02/14/22 2200  metroNIDAZOLE (FLAGYL) IVPB 500 mg  Status:  Discontinued         500 mg 100 mL/hr over 60 Minutes Intravenous Every 12 hours 02/14/22 2140 02/15/22 1403   02/14/22 2145  cefTRIAXone (ROCEPHIN) 2 g in sodium chloride 0.9 % 100 mL IVPB        2 g 200 mL/hr over 30 Minutes Intravenous Every 24 hours 02/14/22 2140 02/21/22 2144   02/14/22 2000  cefTRIAXone (ROCEPHIN) 1 g in sodium chloride 0.9 % 100 mL IVPB  Status:  Discontinued        1 g 200 mL/hr over 30 Minutes Intravenous  Once 02/14/22 1952 02/14/22 2350   02/14/22 2000  metroNIDAZOLE (FLAGYL) IVPB 500 mg  Status:  Discontinued        500 mg 100 mL/hr over 60 Minutes Intravenous  Once 02/14/22 1952 02/14/22 2341       REVIEW OF SYSTEMS:  Const:  fever, negative chills, negative weight loss Eyes: negative diplopia or visual changes, negative eye pain ENT: negative coryza, negative sore throat Resp: + cough baseline, no hemoptysis, dyspnea Cards: negative for chest pain, palpitations, lower extremity edema GU: decreased urine output GI:  abdominal pain, diarrhea, no bleeding, Skin: negative for rash and pruritus Heme: negative for easy bruising and gum/nose bleeding MS: weakness Neurolo:negative for headaches, dizziness, vertigo, memory problems  Psych: negative for feelings of anxiety, depression  Endocrine:  diabetes Allergy/Immunology- levofloxacin - itching Objective:  VITALS:  BP 113/74 (BP Location: Left Arm)   Pulse 82   Temp 97.8 F (36.6 C) (Oral)   Resp 16   Ht 6' (1.829 m)   Wt 121 kg   SpO2 98%   BMI 36.18 kg/m   PHYSICAL EXAM:  Pt curled up in bed under blanket unable to examine thoroughly General: was sleeping but woke up and responded to questions appropriately , no distress,   Head: Normocephalic, without obvious abnormality, atraumatic. Eyes:did not examine ENT could not examine his throat Neck: Supple,   Back: No CVA tenderness. Lungs: b/l air entry Heart: s1s2 Abdomen: Soft, obese. Bowel sounds normal. No masses Extremities: atraumatic, no cyanosis.  No edema. No clubbing Skin: limited examination Lymph: Cervical, supraclavicular normal. Neurologic:  non-focal Pertinent Labs Lab Results CBC    Component Value Date/Time   WBC 10.1 02/15/2022 0529   RBC 5.03  02/15/2022 0529   HGB 14.2 02/15/2022 0529   HCT 42.6 02/15/2022 0529   PLT 236 02/15/2022 0529   MCV 84.7 02/15/2022 0529   MCH 28.2 02/15/2022 0529   MCHC 33.3 02/15/2022 0529   RDW 15.1 02/15/2022 0529   LYMPHSABS 1.7 02/14/2022 1614   MONOABS 1.3 (H) 02/14/2022 1614   EOSABS 0.0 02/14/2022 1614   BASOSABS 0.1 02/14/2022 1614       Latest Ref Rng & Units 02/15/2022    5:29 AM 02/14/2022    4:14 PM 12/01/2020   11:46 AM  CMP  Glucose 70 - 99 mg/dL 076  226  333   BUN 6 - 20 mg/dL 33  33  16   Creatinine 0.61 - 1.24 mg/dL 5.45  6.25  6.38   Sodium 135 - 145 mmol/L 135  130  135   Potassium 3.5 - 5.1 mmol/L 3.0  3.3  4.0   Chloride 98 - 111 mmol/L 101  96  100   CO2 22 - 32 mmol/L 24  20  27    Calcium 8.9 - 10.3 mg/dL 8.1  8.6  8.9   Total Protein 6.5 - 8.1 g/dL  8.7  8.5   Total Bilirubin 0.3 - 1.2 mg/dL  1.0  0.3   Alkaline Phos 38 - 126 U/L  36  36   AST 15 - 41 U/L  23  23   ALT 0 - 44 U/L  23  28       Microbiology: Recent Results (from the past 240 hour(s))  Blood Culture (routine x 2)     Status: None (Preliminary result)   Collection Time: 02/14/22  8:39 PM   Specimen: BLOOD  Result Value Ref Range Status   Specimen Description BLOOD LEFT ASSIST CONTROL  Final   Special Requests   Final    BOTTLES DRAWN AEROBIC AND ANAEROBIC Blood Culture results may not be optimal due to an inadequate volume of blood received in culture bottles   Culture   Final    NO GROWTH < 12 HOURS Performed at [facility], [address] [place], [place]    Report Status PENDING  Incomplete  Blood Culture (routine x 2)     Status: None (Preliminary result)   Collection Time: 02/14/22  9:38 PM   Specimen: BLOOD  Result Value Ref Range Status    Specimen Description BLOOD LEFT ARM  Final   Special Requests   Final    BOTTLES DRAWN AEROBIC AND ANAEROBIC Blood Culture adequate volume   Culture   Final    NO GROWTH < 12 HOURS Performed at [facility], [address] [place], [place]    Report Status PENDING  Incomplete  Gastrointestinal Panel by PCR , Stool     Status: Abnormal   Collection Time: 02/14/22 10:19 PM   Specimen: Stool  Result Value Ref Range Status   Campylobacter species NOT DETECTED NOT DETECTED Final   Plesimonas shigelloides NOT DETECTED NOT DETECTED Final   Salmonella species NOT DETECTED NOT DETECTED Final   Yersinia enterocolitica NOT DETECTED NOT DETECTED Final   Vibrio species NOT DETECTED NOT DETECTED Final   Vibrio cholerae NOT DETECTED NOT DETECTED Final   Enteroaggregative E coli (EAEC) DETECTED (A) NOT DETECTED Final    Comment: RESULT CALLED TO, READ BACK BY AND VERIFIED WITH: MARCELLA TURNER @ 0009 ON 02/15/2022.9/11/2023Marland KitchenTKR    Enteropathogenic E coli (EPEC) NOT DETECTED NOT DETECTED Final   Enterotoxigenic E  coli (ETEC) NOT DETECTED NOT DETECTED Final   Shiga like toxin producing E coli (STEC) NOT DETECTED NOT DETECTED Final   Shigella/Enteroinvasive E coli (EIEC) DETECTED (A) NOT DETECTED Final    Comment: CRITICAL RESULT CALLED TO, READ BACK BY AND VERIFIED WITH: MARCELLA TURNER @ 0009 ON 02/15/2022.Marland KitchenMarland KitchenTKR    Cryptosporidium NOT DETECTED NOT DETECTED Final   Cyclospora cayetanensis NOT DETECTED NOT DETECTED Final   Entamoeba histolytica NOT DETECTED NOT DETECTED Final   Giardia lamblia NOT DETECTED NOT DETECTED Final   Adenovirus F40/41 NOT DETECTED NOT DETECTED Final   Astrovirus NOT DETECTED NOT DETECTED Final   Norovirus GI/GII NOT DETECTED NOT DETECTED Final   Rotavirus A NOT DETECTED NOT DETECTED Final   Sapovirus (I, II, IV, and V) NOT DETECTED NOT DETECTED Final    Comment: Performed at [facility], [address], [place], [place] [address]  C Difficile Quick  Screen w PCR reflex     Status: None   Collection Time: 02/14/22 10:20 PM   Specimen: Stool  Result Value Ref Range Status   C Diff antigen NEGATIVE NEGATIVE Final   C Diff toxin NEGATIVE NEGATIVE Final   C Diff interpretation No C. difficile detected.  Final    Comment: Performed at [facility], [address] [place], [place] [address]    IMAGING RESULTS: CT abdomen- diffuse colitis I have personally reviewed the films ? Impression/Recommendation ?Acute gastroenteritis Acute diarrhea- Acute colitis due EIEC/shigella on ceftriaxone  HIV prelim reactive- high risk for HIV due to sexual practice and this could be acute HIV. Will check HIV RNA and waith for confirmatory test If negative then he is a candidate for PreP ( Pre- exposure Prophylaxis)  AKI due to ATN - improving  DM- non compliant  HTN- non compliant? ? ___________________________________________________ Discussed with patient and pharmacist Will follow Note:  This document was prepared using Dragon voice recognition software and may include unintentional dictation errors.

## 2022-02-15 NOTE — Progress Notes (Signed)
Progress Note   Patient: Aaron [name] OEV:[id number] DOB: 1976-01-06 DOA: 02/14/2022     1 DOS: the patient was seen and examined on 02/15/2022   Brief hospital course: 46 year old man with history of hypertension, diabetes and asthma presented with abdominal pain nausea vomiting and diarrhea.  CT scan of the abdomen pelvis shows diffuse colitis.  Stool studies showing enteroaggregative E. Coli.  Patient on IV Rocephin.  Will discontinue Flagyl.  Had 3 episodes of diarrhea on the morning of 02/15/2022.  Assessment and Plan: * Acute colitis Enteroaggregate of E. coli growing out of stool studies.  Patient initially placed on Rocephin and Zithromax.  Should improve with 3 doses of Rocephin.  Discontinue Flagyl.  Sepsis due to Escherichia coli (E. coli) (HCC) Enteroaggregate of E. coli growing out of stool culture.  On Rocephin.  AKI (acute kidney injury) (HCC) Admission creatinine 3.38 and today's creatinine 2.56.  Continue IV fluids.  Last creatinine back in 2022 shows a creatinine of 1.18 with a GFR greater than 60.  Hypokalemia Replace potassium and IV fluids.  Check magnesium and phosphorus tomorrow morning  Obesity (BMI 30-39.9) BMI of 36.18  Type 2 diabetes mellitus with complication, without long-term current use of insulin (HCC) Hemoglobin A1c added onto labs.  Sugars on the chemistry not that elevated.  Asthma, chronic Lungs clear at this point time  Essential hypertension Continue Coreg and hold off HCTZ given AKI.        Subjective: Patient seen this morning and had 3 episodes of diarrhea this morning before I saw him.  As per nursing staff he had explosive diarrhea in the room and did not make it to the toilet.  Being treated for clinical sepsis and E. coli enteroaggregative in the stool.  Physical Exam: Vitals:   02/14/22 2103 02/14/22 2204 02/15/22 0408 02/15/22 0801  BP: 123/85 (!) 145/93 116/67 111/77  Pulse: 98 95 91 96  Resp: 20 20 16 18   Temp: 99.2 F  (37.3 C) 98 F (36.7 C) 98.7 F (37.1 C) 98.5 F (36.9 C)  TempSrc:  Oral Oral Oral  SpO2: 100% 100% 100% 98%  Weight:  121 kg    Height:  6' (1.829 m)     Physical Exam HENT:     Head: Normocephalic.     Mouth/Throat:     Pharynx: No oropharyngeal exudate.  Eyes:     General: Lids are normal.     Conjunctiva/sclera: Conjunctivae normal.  Cardiovascular:     Rate and Rhythm: Normal rate and regular rhythm.     Heart sounds: Normal heart sounds, S1 normal and S2 normal.  Pulmonary:     Breath sounds: No decreased breath sounds, wheezing, rhonchi or rales.  Abdominal:     Palpations: Abdomen is soft.     Tenderness: There is generalized abdominal tenderness.  Musculoskeletal:     Right lower leg: No swelling.     Left lower leg: No swelling.  Skin:    General: Skin is warm.     Findings: No rash.  Neurological:     Mental Status: He is alert and oriented to person, place, and time.     Data Reviewed: Creatinine 2.56, potassium 3.0, white blood cell count 10.1, hemoglobin 14.2.  CT scan showing diffuse colitis.  E. coli on stool culture.  Family Communication: Deferred  Disposition: Status is: Inpatient Remains inpatient appropriate because: Still having a lot of diarrhea and some uncontrollable diarrhea.  Need to have kidney function better prior to  disposition also.  Planned Discharge Destination: Home   Author: Alford Highland, MD 02/15/2022 2:12 PM  For on call review [email or web address].

## 2022-02-15 NOTE — Hospital Course (Signed)
46 year old man with history of hypertension, diabetes and asthma presented with abdominal pain nausea vomiting and diarrhea.  CT scan of the abdomen pelvis shows diffuse colitis.  Stool studies showing enteroaggregative E. Coli.  Patient on IV Rocephin.  Will discontinue Flagyl.  Had 3 episodes of diarrhea on the morning of 02/15/2022.

## 2022-02-15 NOTE — Assessment & Plan Note (Signed)
BMI of 36.18 Complicates overall care and prognosis.  Recommend lifestyle modifications including physical activity and diet for weight loss and overall long-term health.

## 2022-02-15 NOTE — Progress Notes (Signed)
On-call Provider, K. Foust,NP notified of critical value in stool sample: enterovasive Shigella E. Coli and Enteroaggregative e. Coli, via secure chat. Patient already on Enteric Isolation and IV antibiotics already begun. Windy Carina, RN 1:12 AM 02/15/2022

## 2022-02-16 ENCOUNTER — Other Ambulatory Visit (HOSPITAL_COMMUNITY): Payer: Self-pay

## 2022-02-16 ENCOUNTER — Telehealth (HOSPITAL_COMMUNITY): Payer: Self-pay | Admitting: Pharmacy Technician

## 2022-02-16 DIAGNOSIS — Z21 Asymptomatic human immunodeficiency virus [HIV] infection status: Secondary | ICD-10-CM | POA: Diagnosis present

## 2022-02-16 DIAGNOSIS — A530 Latent syphilis, unspecified as early or late: Secondary | ICD-10-CM | POA: Diagnosis present

## 2022-02-16 DIAGNOSIS — A539 Syphilis, unspecified: Secondary | ICD-10-CM

## 2022-02-16 DIAGNOSIS — B20 Human immunodeficiency virus [HIV] disease: Secondary | ICD-10-CM | POA: Diagnosis present

## 2022-02-16 LAB — PHOSPHORUS: Phosphorus: 2.9 mg/dL (ref 2.5–4.6)

## 2022-02-16 LAB — HIV-1/2 AB - DIFFERENTIATION
HIV 1 Ab: REACTIVE
HIV 2 Ab: NONREACTIVE

## 2022-02-16 LAB — BASIC METABOLIC PANEL
Anion gap: 6 (ref 5–15)
BUN: 20 mg/dL (ref 6–20)
CO2: 26 mmol/L (ref 22–32)
Calcium: 8.4 mg/dL — ABNORMAL LOW (ref 8.9–10.3)
Chloride: 106 mmol/L (ref 98–111)
Creatinine, Ser: 1.41 mg/dL — ABNORMAL HIGH (ref 0.61–1.24)
GFR, Estimated: >60 mL/min (ref >60)
Glucose, Bld: 108 mg/dL — ABNORMAL HIGH (ref 70–99)
Potassium: 3.7 mmol/L (ref 3.5–5.1)
Sodium: 138 mmol/L (ref 135–145)

## 2022-02-16 LAB — CHLAMYDIA/NGC RT PCR (ARMC ONLY)
Chlamydia Tr: NOT DETECTED
N gonorrhoeae: NOT DETECTED

## 2022-02-16 LAB — RPR
RPR Ser Ql: REACTIVE — AB
RPR Titer: 1:32 {titer}

## 2022-02-16 LAB — HIV-1 RNA QUANT-NO REFLEX-BLD
HIV 1 RNA Quant: 110 copies/mL
LOG10 HIV-1 RNA: 2.041 log10copy/mL

## 2022-02-16 LAB — MAGNESIUM: Magnesium: 2.4 mg/dL (ref 1.7–2.4)

## 2022-02-16 MED ORDER — PENICILLIN G BENZATHINE 1200000 UNIT/2ML IM SUSY
2.4000 10*6.[IU] | PREFILLED_SYRINGE | Freq: Once | INTRAMUSCULAR | Status: AC
Start: 2022-02-16 — End: 2022-02-16
  Administered 2022-02-16: 2.4 10*6.[IU] via INTRAMUSCULAR
  Filled 2022-02-16: qty 4

## 2022-02-16 NOTE — Discharge Summary (Addendum)
Physician Discharge Summary   Patient: Aaron [name] MRN: [id number] DOB: November 11, 1975  Admit date:     02/14/2022  Discharge date: 02/16/2022  Discharge Physician: Pennie Banter   PCP: Pcp, No   Recommendations at discharge:   Follow-up with infectious disease on 02/24/22 at 10.30 AM  Primary care in 1 to 2 weeks Repeat BMP, CBC, Mg level in 1 to 2 weeks Follow-up pending labs (T. pallidum antibody, CD4 count, HIV differentiation and viral load)  Discharge Diagnoses: Principal Problem:   Acute colitis Active Problems:   Sepsis due to Escherichia coli (E. 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 *  Hospital Course: 46 year old man with history of hypertension, diabetes and asthma presented with abdominal pain nausea vomiting and diarrhea.  CT scan of the abdomen pelvis shows diffuse colitis.  Stool studies showing enteroaggregative E. Coli.  Patient on IV Rocephin.  Will discontinue Flagyl.  Had 3 episodes of diarrhea on the morning of 02/15/2022.  Assessment and Plan: * Acute colitis Enteroaggregate of E. coli growing out of stool studies.  Treated with Rocephin, antibiotics completed during admission.  Was initially also on Zithromax and Flagyl which were stopped once GI panel resulted.    Sepsis due to Escherichia coli (E. coli) (HCC) Enteroaggregate of E. coli growing out of stool culture.  Completed treatment with Rocephin. Diarrhea improved.  AKI (acute kidney injury) (HCC) DUE TO ATN. Admission creatinine 3.38 and improved on IV fluids.  Cr today was 1.41.  Treated with IV fluids.  Baseline creatinine in 2022 was 1.18 with a GFR greater than 60. --Pt tolerating PO intake, adequately  hydrating orally --Repeat BMP in 1 week  Hypokalemia Replace potassium and IV fluids.  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 Infectious Disease has follow up scheduled to initiate treatment if diagnosis is confirmed.  Pt is at risk given report of unprotected sex with male partner/s  Positive RPR test Treated with PCN G benzathine today. Follow up outpatient with Infectious Disease. Confirmation results are pending at time of d/c.  Obesity (BMI 30-39.9) BMI of 36.18 Complicates overall care and prognosis.  Recommend lifestyle modifications including physical activity and diet for weight loss and overall long-term health.   Type 2 diabetes mellitus with complication, without long-term current use of insulin (HCC) Hemoglobin A1c added onto labs.  Sugars on the chemistry not that elevated.  Asthma, chronic Lungs clear at this point time  Essential hypertension Continue Coreg and hold off HCTZ given AKI.         Consultants: Infectious disease Procedures performed: none  Disposition: Home Diet recommendation:  Discharge Diet Orders (From admission, onward)     Start     Ordered   02/16/22 0000  Diet - low sodium heart healthy        02/16/22 1515           Cardiac and Carb modified diet DISCHARGE MEDICATION: Allergies as of 02/16/2022       Reactions   Levaquin [levofloxacin] Itching, Rash        Medication List     STOP taking these medications  hydrochlorothiazide 25 MG tablet Commonly known as: HYDRODIURIL   orphenadrine 100 MG tablet Commonly known as: NORFLEX   oxyCODONE-acetaminophen 7.5-325 MG tablet Commonly known as: Percocet       TAKE these medications    albuterol 108 (90 Base) MCG/ACT inhaler Commonly known as: VENTOLIN HFA Inhale 2 puffs into the lungs every 6 (six) hours as needed for wheezing or shortness of breath.   carvedilol 6.25 MG  tablet Commonly known as: Coreg Take 1 tablet (6.25 mg total) by mouth 2 (two) times daily.   colchicine 0.6 MG tablet Take 1 tablet (0.6 mg total) by mouth 2 (two) times daily.        Discharge Exam: Filed Weights   02/14/22 1527 02/14/22 2204  Weight: 111.1 kg 121 kg   General exam: awake, alert, no acute distress, obese HEENT: atraumatic, clear conjunctiva, anicteric sclera, moist mucus membranes, hearing grossly normal  Respiratory system: CTAB, no wheezes, rales or rhonchi, normal respiratory effort. Cardiovascular system: normal S1/S2, RRR, no JVD, murmurs, rubs, gallops,  no pedal edema.   Gastrointestinal system: soft, NT, ND, no HSM felt, +bowel sounds. Central nervous system: A&O x3. no gross focal neurologic deficits, normal speech Extremities: moves all, no edema, normal tone Skin: dry, intact, normal temperature Psychiatry: normal mood, congruent affect, judgement and insight appear normal   Condition at discharge: stable  The results of significant diagnostics from this hospitalization (including imaging, microbiology, ancillary and laboratory) are listed below for reference.   Imaging Studies: CT ABDOMEN PELVIS WO CONTRAST  Result Date: 02/14/2022 CLINICAL DATA:  Nausea vomiting and diarrhea. Abdominal pain for 3 days. Inability to urinate for 3 days. Fever. EXAM: CT ABDOMEN AND PELVIS WITHOUT CONTRAST TECHNIQUE: Multidetector CT imaging of the abdomen and pelvis was performed following the standard protocol without IV contrast. RADIATION DOSE REDUCTION: This exam was performed according to the departmental dose-optimization program which includes automated exposure control, adjustment of the mA and/or kV according to patient size and/or use of iterative reconstruction technique. COMPARISON:  Renal ultrasound 11/22/2006. No prior CT. FINDINGS: Lower chest: Clear lung bases. Normal heart size without pericardial or pleural effusion. Hepatobiliary: Moderate hepatic  steatosis. Normal gallbladder, without biliary ductal dilatation. Pancreas: Normal, without mass or ductal dilatation. Spleen: Normal in size, without focal abnormality. Adrenals/Urinary Tract: Normal adrenal glands. No renal calculi or hydronephrosis. No hydroureter or ureteric calculi. No bladder calculi. Stomach/Bowel: Normal stomach, without wall thickening. Diffuse mild colonic wall thickening with pericolonic edema which is most apparent adjacent the transverse colon including on 30/2. Normal terminal ileum and appendix.  Normal small bowel. Vascular/Lymphatic: Normal caliber of the aorta and branch vessels. No abdominopelvic adenopathy. Reproductive: Normal prostate. Other: No significant free fluid.  No free intraperitoneal air. Musculoskeletal: No sacroiliitis.  Disc bulge at L4-5 is mild. IMPRESSION: 1. Diffuse colitis. Distribution and demographics favor infection versus inflammatory bowel disease/ulcerative colitis. 2.  No urinary tract calculi or hydronephrosis. 3. Hepatic steatosis Electronically Signed   By: Jeronimo Greaves M.D.   On: 02/14/2022 17:35    Microbiology: Results for orders placed or performed during the hospital encounter of 02/14/22  Blood Culture (routine x 2)     Status: None   Collection Time: 02/14/22  8:39 PM   Specimen: BLOOD  Result Value Ref Range Status   Specimen Description BLOOD LEFT ASSIST CONTROL  Final   Special Requests   Final    BOTTLES DRAWN AEROBIC AND ANAEROBIC Blood Culture results may not be optimal due to an inadequate volume  of blood received in culture bottles   Culture   Final    NO GROWTH 5 DAYS Performed at [facility], [address] [place], [place] [address]    Report Status 02/19/2022 FINAL  Final  Blood Culture (routine x 2)     Status: None   Collection Time: 02/14/22  9:38 PM   Specimen: BLOOD  Result Value Ref Range Status   Specimen Description BLOOD LEFT ARM  Final   Special Requests   Final    BOTTLES DRAWN AEROBIC  AND ANAEROBIC Blood Culture adequate volume   Culture   Final    NO GROWTH 5 DAYS Performed at [facility], [address] [place], [place] [address]    Report Status 02/19/2022 FINAL  Final  Gastrointestinal Panel by PCR , Stool     Status: Abnormal   Collection Time: 02/14/22 10:19 PM   Specimen: Stool  Result Value Ref Range Status   Campylobacter species NOT DETECTED NOT DETECTED Final   Plesimonas shigelloides NOT DETECTED NOT DETECTED Final   Salmonella species NOT DETECTED NOT DETECTED Final   Yersinia enterocolitica NOT DETECTED NOT DETECTED Final   Vibrio species NOT DETECTED NOT DETECTED Final   Vibrio cholerae NOT DETECTED NOT DETECTED Final   Enteroaggregative E coli (EAEC) DETECTED (A) NOT DETECTED Final    Comment: RESULT CALLED TO, READ BACK BY AND VERIFIED WITH: MARCELLA TURNER @ 0009 ON 02/15/2022.Marland KitchenMarland KitchenTKR    Enteropathogenic E coli (EPEC) NOT DETECTED NOT DETECTED Final   Enterotoxigenic E coli (ETEC) NOT DETECTED NOT DETECTED Final   Shiga like toxin producing E coli (STEC) NOT DETECTED NOT DETECTED Final   Shigella/Enteroinvasive E coli (EIEC) DETECTED (A) NOT DETECTED Final    Comment: CRITICAL RESULT CALLED TO, READ BACK BY AND VERIFIED WITH: MARCELLA TURNER @ 0009 ON 02/15/2022.Marland KitchenMarland KitchenTKR    Cryptosporidium NOT DETECTED NOT DETECTED Final   Cyclospora cayetanensis NOT DETECTED NOT DETECTED Final   Entamoeba histolytica NOT DETECTED NOT DETECTED Final   Giardia lamblia NOT DETECTED NOT DETECTED Final   Adenovirus F40/41 NOT DETECTED NOT DETECTED Final   Astrovirus NOT DETECTED NOT DETECTED Final   Norovirus GI/GII NOT DETECTED NOT DETECTED Final   Rotavirus A NOT DETECTED NOT DETECTED Final   Sapovirus (I, II, IV, and V) NOT DETECTED NOT DETECTED Final    Comment: Performed at [facility], [address], [place], [place] [address]  C Difficile Quick Screen w PCR reflex     Status: None   Collection Time: 02/14/22 10:20 PM   Specimen: Stool   Result Value Ref Range Status   C Diff antigen NEGATIVE NEGATIVE Final   C Diff toxin NEGATIVE NEGATIVE Final   C Diff interpretation No C. difficile detected.  Final    Comment: Performed at [facility], [address] [place], [place] [address]  Chlamydia/NGC rt PCR [facility] only)     Status: None   Collection Time: 02/15/22  7:00 AM   Specimen: Urine  Result Value Ref Range Status   Specimen source GC/Chlam URINE, RANDOM  Final   Chlamydia Tr NOT DETECTED NOT DETECTED Final   N gonorrhoeae NOT DETECTED NOT DETECTED Final    Comment: (NOTE) This CT/NG assay has not been evaluated in patients with a history of  hysterectomy. Performed at [facility], [address] [place], [place] [address]     Labs: CBC: No results for input(s): "WBC", "NEUTROABS", "HGB", "HCT", "MCV", "PLT" in the last 168 hours.  Basic Metabolic Panel: No results for input(s): "NA", "K", "CL", "CO2", "GLUCOSE", "BUN", "CREATININE", "CALCIUM", "MG", "PHOS" in the last 168 hours.  Liver Function Tests: No results for input(s): "AST", "ALT", "ALKPHOS", "BILITOT", "PROT", "ALBUMIN" in the last 168 hours.  CBG: No results for input(s): "GLUCAP" in the last 168 hours.  Discharge time spent: greater than 30 minutes.  Signed: Pennie Banter, DO [facility] 03/01/2022

## 2022-02-16 NOTE — TOC Initial Note (Signed)
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  Patient does not have PCP, provided resources for [facility]  and [facility] services  TOC to follow for medication needs         Patient Goals and CMS Choice        Expected Discharge Plan and Services                                                Prior Living Arrangements/Services                       Activities of Daily Living Home Assistive Devices/Equipment: None ADL Screening (condition at time of admission) Patient's cognitive ability adequate to safely complete daily activities?: Yes Is the patient deaf or have difficulty hearing?: No Does the patient have difficulty seeing, even when wearing glasses/contacts?: No Does the patient have difficulty concentrating, remembering, or making decisions?: No Patient able to express need for assistance with ADLs?: Yes Does the patient have difficulty dressing or bathing?: No Independently performs ADLs?: Yes (appropriate for developmental age) Does the patient have difficulty walking or climbing stairs?: No Weakness of Legs: None Weakness of Arms/Hands: None  Permission Sought/Granted                  Emotional Assessment              Admission diagnosis:  Dehydration [E86.0] Colitis [K52.9] Acute colitis [K52.9] AKI (acute kidney injury) (HCC) [N17.9] Patient Active Problem List   Diagnosis Date Noted   Sepsis due to Escherichia coli (E. coli) (HCC) 02/15/2022   Obesity (BMI 30-39.9) 02/15/2022   Acute colitis 02/14/2022   Hypokalemia  02/14/2022   Essential hypertension 02/14/2022   AKI (acute kidney injury) (HCC) 02/14/2022   Asthma, chronic 02/14/2022   Type 2 diabetes mellitus with complication, without long-term current use of insulin (HCC) 02/14/2022   Angioedema 04/20/2016   PCP:  Oneita Hurt, No Pharmacy:   [facility], [place] - [address] [place] [address] [place] [address] Phone: [phone] Fax: [phone]  [facility] [address] [place] - [address] [place]) [place] [address] Phone: [phone] Fax: [phone]     Social Determinants of Health (SDOH) Interventions    Readmission Risk Interventions     No data to display

## 2022-02-16 NOTE — Assessment & Plan Note (Signed)
Genotype and RNA/quant pending at time of d/c. Infectious Disease has follow up scheduled to initiate treatment if diagnosis is confirmed.  Pt is at risk given report of unprotected sex with male partner/s

## 2022-02-16 NOTE — Assessment & Plan Note (Signed)
Treated with PCN G benzathine today. Follow up outpatient with Infectious Disease. Confirmation results are pending at time of d/c.

## 2022-02-16 NOTE — Telephone Encounter (Signed)
ERRor

## 2022-02-16 NOTE — Progress Notes (Signed)
ID Pt says diarrhea resolved Wants to go home  O/e awake and alert Patient Vitals for the past 24 hrs:  BP Temp Temp src Pulse Resp SpO2  02/16/22 1500 -- 98.4 F (36.9 C) Oral 86 -- --  02/16/22 1055 109/77 -- -- 86 -- 99 %  02/16/22 1052 (!) 130/102 98.4 F (36.9 C) Oral 89 -- (!) 87 %  02/16/22 0807 129/75 98.1 F (36.7 C) -- 79 16 100 %  02/16/22 0445 131/85 98.2 F (36.8 C) -- 80 17 100 %  02/15/22 1955 121/77 99.3 F (37.4 C) Oral 87 18 100 %    Chest b/l air entry [id number] Abd soft No tenderness CNS non focal  Labs    Latest Ref Rng & Units 02/15/2022    5:29 AM 02/14/2022    4:14 PM 12/01/2020   11:46 AM  CBC  WBC 4.0 - 10.5 K/uL 10.1  12.0  9.7   Hemoglobin 13.0 - 17.0 g/dL 02.6  37.8  58.8   Hematocrit 39.0 - 52.0 % 42.6  47.3  42.6   Platelets 150 - 400 K/uL 236  206  367        Latest Ref Rng & Units 02/16/2022    4:39 AM 02/15/2022    5:29 AM 02/14/2022    4:14 PM  CMP  Glucose 70 - 99 mg/dL 502  774  128   BUN 6 - 20 mg/dL 20  33  33   Creatinine 0.61 - 1.24 mg/dL 7.86  7.67  2.09   Sodium 135 - 145 mmol/L 138  135  130   Potassium 3.5 - 5.1 mmol/L 3.7  3.0  3.3   Chloride 98 - 111 mmol/L 106  101  96   CO2 22 - 32 mmol/L 26  24  20    Calcium 8.9 - 10.3 mg/dL 8.4  8.1  8.6   Total Protein 6.5 - 8.1 g/dL   8.7   Total Bilirubin 0.3 - 1.2 mg/dL   1.0   Alkaline Phos 38 - 126 U/L   36   AST 15 - 41 U/L   23   ALT 0 - 44 U/L   23     RPR reactive' 1:32  HIV prelim positive confirmatory pending   Impression/recommendation Acute gastroenteritis /colitis- resolved with Iv ceftriaxone X 3 doses Had EAEC/EIEC( e.coli) Does not need any further antibiotic  AKI secondary to Colitis- has improved with IV fluids  HIV prelim test positive- confirmatory pending High risk for HIV as MSM Will follow next week  RPR - reactive - will give Benzathine penicillin-2.4 million units dont know his previous test  May need 2 more weekly does Appt made for  02/24/22 with me Discussed the management with the patient and the hospitalist

## 2022-02-16 NOTE — Progress Notes (Addendum)
Discharge instructions reviewed with patient at the bedside. Patient did opt to receive penicillin shots in his right and left deltoid muscle. Patient declined receiving them via gluteal muscle even after receiving education. Patient stated he would only be willing to take this shot in his deltoids. Patient states he has no further questions or concerns at this time. PIV removed. Patient declines need for wheelchair and says he will walk. Patient is safe and stable to DC

## 2022-02-17 LAB — HELPER T-LYMPH-CD4 (ARMC ONLY)
% CD 4 Pos. Lymph.: 52.6 % (ref 30.8–58.5)
Absolute CD 4 Helper: 1157 /uL (ref 359–1519)
Basophils Absolute: 0.1 10*3/uL (ref 0.0–0.2)
Basos: 1 %
EOS (ABSOLUTE): 0.1 10*3/uL (ref 0.0–0.4)
Eos: 2 %
Hematocrit: 40.4 % (ref 37.5–51.0)
Hemoglobin: 13.5 g/dL (ref 13.0–17.7)
Immature Grans (Abs): 0.1 10*3/uL (ref 0.0–0.1)
Immature Granulocytes: 1 %
Lymphocytes Absolute: 2.2 10*3/uL (ref 0.7–3.1)
Lymphs: 27 %
MCH: 28.9 pg (ref 26.6–33.0)
MCHC: 33.4 g/dL (ref 31.5–35.7)
MCV: 87 fL (ref 79–97)
Monocytes Absolute: 1.1 10*3/uL — ABNORMAL HIGH (ref 0.1–0.9)
Monocytes: 13 %
Neutrophils Absolute: 4.6 10*3/uL (ref 1.4–7.0)
Neutrophils: 56 %
Platelets: 257 10*3/uL (ref 150–450)
RBC: 4.67 x10E6/uL (ref 4.14–5.80)
RDW: 15 % (ref 11.6–15.4)
WBC: 8.2 10*3/uL (ref 3.4–10.8)

## 2022-02-17 LAB — T.PALLIDUM AB, TOTAL: T Pallidum Abs: REACTIVE — AB

## 2022-02-19 LAB — CULTURE, BLOOD (ROUTINE X 2)
Culture: NO GROWTH
Culture: NO GROWTH
Special Requests: ADEQUATE

## 2022-02-24 ENCOUNTER — Inpatient Hospital Stay: Payer: Medicaid Other | Admitting: Infectious Diseases

## 2022-03-08 ENCOUNTER — Ambulatory Visit: Payer: Medicaid Other | Attending: Infectious Diseases | Admitting: Infectious Diseases

## 2022-03-08 VITALS — BP 174/120 | HR 101 | Temp 97.3°F

## 2022-03-08 DIAGNOSIS — A539 Syphilis, unspecified: Secondary | ICD-10-CM

## 2022-03-08 DIAGNOSIS — B20 Human immunodeficiency virus [HIV] disease: Secondary | ICD-10-CM

## 2022-03-08 DIAGNOSIS — A530 Latent syphilis, unspecified as early or late: Secondary | ICD-10-CM

## 2022-03-08 MED ORDER — PENICILLIN G BENZATHINE 1200000 UNIT/2ML IM SUSY
2.4000 10*6.[IU] | PREFILLED_SYRINGE | Freq: Once | INTRAMUSCULAR | Status: AC
  Administered 2022-03-08: 2.4 10*6.[IU] via INTRAMUSCULAR

## 2022-03-08 NOTE — Patient Instructions (Addendum)
You are here for treatment of syphilis- you need three weekly shots of benzathine penicillin 2.4 million units- today you will get your first shot and the next would be on 03/15/22 and 03/22/22 Please go to [facility] 03/15/22 next week to engage in HIV care

## 2022-03-08 NOTE — Progress Notes (Signed)
NAME: Aaron [name]  DOB: 04-01-1976  MRN: [id number]  Date/Time: 03/08/2022 9:34 AM   Subjective:   ? Aaron [name] is a 46 y.o. male newly diagnosed HIV on 02/15/22 when he was recently hospitalized for gastroenteritis. Vl is 110 and cd4  1157 I had seen him as In patient when HIV test was done- The confirmatory test came back after his discharge and he was aware of the test result HE also had positive RPR 1:32 and because there was no prior test and he was asymptomatic it was decided to treat like late latent and he got one dose on BP 2.4 million units on 02/16/22- He never came back for the other 2 doses So today he is here to get him injection As he has no insurance he will have to enroll in RW program and he has been referred to [facility] HE has an appt with them for next week    ? Past Medical History:  Diagnosis Date   Asthma    Diabetes mellitus without complication (HCC)    Hypertension     No past surgical history on file.  Social History   Socioeconomic History   Marital status: Single    Spouse name: Not on file   Number of children: Not on file   Years of education: Not on file   Highest education level: Not on file  Occupational History   Not on file  Tobacco Use   Smoking status: Every Day    Types: Cigarettes   Smokeless tobacco: Never  Substance and Sexual Activity   Alcohol use: Yes   Drug use: No   Sexual activity: Not on file  Other Topics Concern   Not on file  Social History Narrative   Not on file   Social Determinants of Health   Financial Resource Strain: Not on file  Food Insecurity: Not on file  Transportation Needs: Not on file  Physical Activity: Not on file  Stress: Not on file  Social Connections: Not on file  Intimate Partner Violence: Not on file    No family history on file. Allergies  Allergen Reactions   Levaquin [Levofloxacin] Itching and Rash   ? Current Outpatient Medications  Medication Sig Dispense Refill    albuterol (VENTOLIN HFA) 108 (90 Base) MCG/ACT inhaler Inhale 2 puffs into the lungs every 6 (six) hours as needed for wheezing or shortness of breath. 8 g 2   carvedilol (COREG) 6.25 MG tablet Take 1 tablet (6.25 mg total) by mouth 2 (two) times daily. 60 tablet 11   colchicine 0.6 MG tablet Take 1 tablet (0.6 mg total) by mouth 2 (two) times daily. 60 tablet 2   No current facility-administered medications for this visit.    REVIEW OF SYSTEMS:  Const: negative fever, negative chills, negative weight loss Eyes: negative diplopia or visual changes, negative eye pain ENT: negative coryza, negative sore throat Resp: negative cough, hemoptysis, dyspnea Cards: negative for chest pain, palpitations, lower extremity edema GU: negative for frequency, dysuria and hematuria Skin: negative for rash and pruritus Heme: negative for easy bruising and gum/nose bleeding MS: negative for myalgias, arthralgias, back pain and muscle weakness Neurolo:negative for headaches, dizziness, vertigo, memory problems  Psych: negative for feelings of anxiety, depression   Objective:  VITALS:  BP (!) 174/120   Pulse (!) 101   Temp (!) 97.3 F (36.3 C) (Temporal)  PHYSICAL EXAM:  General: Alert, cooperative, no distress, appears stated age.  Head: Normocephalic, without  obvious abnormality, atraumatic. Eyes: Conjunctivae clear, anicteric sclerae. Pupils are equal Nose: Nares normal. No drainage or sinus tenderness. Throat: Lips, mucosa, and tongue normal. No Thrush Neck: Supple, symmetrical, no adenopathy, thyroid: non tender no carotid bruit and no JVD. Back: No CVA tenderness. Lungs: Clear to auscultation bilaterally. No Wheezing or Rhonchi. No rales. Heart: Regular rate and rhythm, no murmur, rub or gallop. Abdomen: Soft, non-tender,not distended. Bowel sounds normal. No masses Extremities: Extremities normal, atraumatic, no cyanosis. No edema. No clubbing Skin: No rashes or lesions. Not Jaundiced Lymph:  Cervical, supraclavicular normal. Neurologic: Grossly non-focal  Impression/Recommendation ? ?Latent  syphilis- here for starting penicillin  He will get benzathine penicillin 2.4 million units today and his next dose will be on 03/15/[facility] at [facility] and third dose he will come back here 03/22/22  HIV- newly diagnosed- low VL ( 115) and cd4> 1000 May enroll in a clinical trial at [facility] Appt with me next week  This visit is not charged ?

## 2022-03-09 ENCOUNTER — Encounter: Payer: Medicaid Other | Admitting: *Deleted

## 2022-03-13 ENCOUNTER — Other Ambulatory Visit: Payer: Self-pay

## 2022-03-13 ENCOUNTER — Emergency Department: Payer: Medicaid Other

## 2022-03-13 ENCOUNTER — Emergency Department
Admission: EM | Admit: 2022-03-13 | Discharge: 2022-03-13 | Disposition: A | Discharge status: Home / Self Care | Payer: Self-pay | Attending: Emergency Medicine | Admitting: Emergency Medicine

## 2022-03-13 DIAGNOSIS — Z21 Asymptomatic human immunodeficiency virus [HIV] infection status: Secondary | ICD-10-CM | POA: Insufficient documentation

## 2022-03-13 DIAGNOSIS — I10 Essential (primary) hypertension: Secondary | ICD-10-CM | POA: Insufficient documentation

## 2022-03-13 DIAGNOSIS — J441 Chronic obstructive pulmonary disease with (acute) exacerbation: Secondary | ICD-10-CM | POA: Insufficient documentation

## 2022-03-13 DIAGNOSIS — E119 Type 2 diabetes mellitus without complications: Secondary | ICD-10-CM | POA: Insufficient documentation

## 2022-03-13 DIAGNOSIS — Z20822 Contact with and (suspected) exposure to covid-19: Secondary | ICD-10-CM | POA: Insufficient documentation

## 2022-03-13 LAB — CBC WITH DIFFERENTIAL/PLATELET
Abs Immature Granulocytes: 0.03 10*3/uL (ref 0.00–0.07)
Basophils Absolute: 0.1 10*3/uL (ref 0.0–0.1)
Basophils Relative: 0 %
Eosinophils Absolute: 0.5 10*3/uL (ref 0.0–0.5)
Eosinophils Relative: 4 %
HCT: 39.9 % (ref 39.0–52.0)
Hemoglobin: 13.1 g/dL (ref 13.0–17.0)
Immature Granulocytes: 0 %
Lymphocytes Relative: 19 %
Lymphs Abs: 2.2 10*3/uL (ref 0.7–4.0)
MCH: 28.5 pg (ref 26.0–34.0)
MCHC: 32.8 g/dL (ref 30.0–36.0)
MCV: 86.9 fL (ref 80.0–100.0)
Monocytes Absolute: 1.2 10*3/uL — ABNORMAL HIGH (ref 0.1–1.0)
Monocytes Relative: 11 %
Neutro Abs: 7.5 10*3/uL (ref 1.7–7.7)
Neutrophils Relative %: 66 %
Platelets: 300 10*3/uL (ref 150–400)
RBC: 4.59 MIL/uL (ref 4.22–5.81)
RDW: 15.1 % (ref 11.5–15.5)
WBC: 11.6 10*3/uL — ABNORMAL HIGH (ref 4.0–10.5)
nRBC: 0 % (ref 0.0–0.2)

## 2022-03-13 LAB — LACTIC ACID, PLASMA: Lactic Acid, Venous: 1.7 mmol/L (ref 0.5–1.9)

## 2022-03-13 LAB — COMPREHENSIVE METABOLIC PANEL
ALT: 28 U/L (ref 0–44)
AST: 26 U/L (ref 15–41)
Albumin: 4.1 g/dL (ref 3.5–5.0)
Alkaline Phosphatase: 36 U/L — ABNORMAL LOW (ref 38–126)
Anion gap: 7 (ref 5–15)
BUN: 14 mg/dL (ref 6–20)
CO2: 23 mmol/L (ref 22–32)
Calcium: 8.9 mg/dL (ref 8.9–10.3)
Chloride: 104 mmol/L (ref 98–111)
Creatinine, Ser: 1.18 mg/dL (ref 0.61–1.24)
GFR, Estimated: >60 mL/min (ref >60)
Glucose, Bld: 144 mg/dL — ABNORMAL HIGH (ref 70–99)
Potassium: 3.2 mmol/L — ABNORMAL LOW (ref 3.5–5.1)
Sodium: 134 mmol/L — ABNORMAL LOW (ref 135–145)
Total Bilirubin: 0.6 mg/dL (ref 0.3–1.2)
Total Protein: 8.2 g/dL — ABNORMAL HIGH (ref 6.5–8.1)

## 2022-03-13 LAB — TROPONIN I (HIGH SENSITIVITY)
Troponin I (High Sensitivity): 11 ng/L (ref <18)
Troponin I (High Sensitivity): 13 ng/L (ref <18)

## 2022-03-13 LAB — BLOOD GAS, VENOUS
Acid-Base Excess: 0.8 mmol/L (ref 0.0–2.0)
Bicarbonate: 26.6 mmol/L (ref 20.0–28.0)
O2 Content: 2 L/min
O2 Saturation: 73.6 %
Patient temperature: 37
pCO2, Ven: 46 mmHg (ref 44–60)
pH, Ven: 7.37 (ref 7.25–7.43)
pO2, Ven: 44 mmHg (ref 32–45)

## 2022-03-13 LAB — BRAIN NATRIURETIC PEPTIDE: B Natriuretic Peptide: 15.6 pg/mL (ref 0.0–100.0)

## 2022-03-13 LAB — PROCALCITONIN: Procalcitonin: <0.1 ng/mL

## 2022-03-13 LAB — SARS CORONAVIRUS 2 BY RT PCR: SARS Coronavirus 2 by RT PCR: NEGATIVE

## 2022-03-13 MED ORDER — SODIUM CHLORIDE 0.9 % IV SOLN
2.0000 g | Freq: Once | INTRAVENOUS | Status: AC
  Administered 2022-03-13: 2 g via INTRAVENOUS
  Filled 2022-03-13: qty 20

## 2022-03-13 MED ORDER — ALBUTEROL SULFATE (2.5 MG/3ML) 0.083% IN NEBU
2.5000 mg | INHALATION_SOLUTION | Freq: Once | RESPIRATORY_TRACT | Status: AC
  Administered 2022-03-13: 2.5 mg via RESPIRATORY_TRACT
  Filled 2022-03-13: qty 3

## 2022-03-13 MED ORDER — PREDNISONE 20 MG PO TABS: 60.0000 mg | ORAL_TABLET | Freq: Every day | ORAL | 0 refills | Status: AC

## 2022-03-13 MED ORDER — DOXYCYCLINE HYCLATE 100 MG PO CAPS: 100.0000 mg | ORAL_CAPSULE | Freq: Two times a day (BID) | ORAL | 0 refills | Status: AC

## 2022-03-13 MED ORDER — LACTATED RINGERS IV BOLUS
500.0000 mL | Freq: Once | INTRAVENOUS | Status: AC
  Administered 2022-03-13: 500 mL via INTRAVENOUS

## 2022-03-13 MED ORDER — ALBUTEROL SULFATE HFA 108 (90 BASE) MCG/ACT IN AERS: 2.0000 | INHALATION_SPRAY | RESPIRATORY_TRACT | 2 refills | Status: DC | PRN

## 2022-03-13 MED ORDER — POTASSIUM CHLORIDE CRYS ER 20 MEQ PO TBCR
40.0000 meq | EXTENDED_RELEASE_TABLET | Freq: Once | ORAL | Status: AC
  Administered 2022-03-13: 40 meq via ORAL
  Filled 2022-03-13: qty 2

## 2022-03-13 MED ORDER — DOXYCYCLINE HYCLATE 100 MG PO TABS
100.0000 mg | ORAL_TABLET | Freq: Once | ORAL | Status: AC
  Administered 2022-03-13: 100 mg via ORAL
  Filled 2022-03-13: qty 1

## 2022-03-13 MED ORDER — IPRATROPIUM-ALBUTEROL 0.5-2.5 (3) MG/3ML IN SOLN
3.0000 mL | Freq: Once | RESPIRATORY_TRACT | Status: AC
  Administered 2022-03-13: 3 mL via RESPIRATORY_TRACT
  Filled 2022-03-13: qty 3

## 2022-03-13 NOTE — Discharge Instructions (Addendum)
Take the antibiotics and steroids as prescribed  Use your albuterol inhaler - 2 puffs every 4 hours while awake, for the next 24 hours, then as needed

## 2022-03-13 NOTE — ED Triage Notes (Signed)
Pt was recently taken off of lasix due to kidney problems. EMS called out for shob. Pt had audible wheezing with EMS and he was given 2 duoneb and solumedrol. Pt has hx of COPD and is a smoker. Pt has been feeling shob since last night.

## 2022-03-13 NOTE — ED Provider Notes (Signed)
[facility] Provider Note    Event Date/Time   First MD Initiated Contact with Patient 03/13/22 [phone]     (approximate)   History   Shortness of Breath   HPI  Aaron [name] is a 46 y.o. male with past medical history of hypertension, diabetes, obesity, recently diagnosed sepsis due to E. coli colitis, HIV positive but with normal CD4 helper cells, here with shortness of breath.  The patient states that starting yesterday, he developed progressively worsening, gradual onset, shortness of breath with wheezing.  He has had progressively worsening wheezing since then, with significant dyspnea.  States that he was trying to go to church today and was agitated, drowsy, having a hard time getting comfortable.  System called EMS.  With EMS arrival, patient was severely wheezing with dyspnea.  He is not hypoxic.  He was speaking in 1-2 word sentences.  He was given DuoNebs and Solu-Medrol IV in route.  He continues to wheeze now.  States he has no chest pain.  Denies any sputum production.  No other complaints.     Physical Exam   Triage Vital Signs: ED Triage Vitals  Enc Vitals Group     BP --      Pulse --      Resp --      Temp --      Temp src --      SpO2 --      Weight 03/13/22 1600 275 lb 9.2 oz (125 kg)     Height 03/13/22 1600 5\' 11"  (1.803 m)     Head Circumference --      Peak Flow --      Pain Score 03/13/22 1603 0     Pain Loc --      Pain Edu? --      Excl. in GC? --     Most recent vital signs: Vitals:   03/13/22 1842 03/13/22 2020  BP:  (!) 148/72  Pulse:  (!) 102  Resp:  16  Temp:  98.4 F (36.9 C)  SpO2: 97% 99%     General: Awake, mild respiratory distress with tachypnea. CV:  Good peripheral perfusion.  Tachycardic. Resp:  Suprasternal retractions noted, speaking in 2-3 word sentences.  Bilateral rales with significant diminished aeration and bilateral wheezing. Abd:  No distention.  Other:  1+ pitting edema bilateral  lower extremities.   ED Results / Procedures / Treatments   Labs (all labs ordered are listed, but only abnormal results are displayed) Labs Reviewed  CBC WITH DIFFERENTIAL/PLATELET - Abnormal; Notable for the following components:      Result Value   WBC 11.6 (*)    Monocytes Absolute 1.2 (*)    All other components within normal limits  COMPREHENSIVE METABOLIC PANEL - Abnormal; Notable for the following components:   Sodium 134 (*)    Potassium 3.2 (*)    Glucose, Bld 144 (*)    Total Protein 8.2 (*)    Alkaline Phosphatase 36 (*)    All other components within normal limits  SARS CORONAVIRUS 2 BY RT PCR  CULTURE, BLOOD (SINGLE)  LACTIC ACID, PLASMA  PROCALCITONIN  BRAIN NATRIURETIC PEPTIDE  BLOOD GAS, VENOUS  LACTIC ACID, PLASMA  TROPONIN I (HIGH SENSITIVITY)  TROPONIN I (HIGH SENSITIVITY)     EKG Sinus tachycardia, VR 112. PR 134, QRS 100, QTc 489. No acute ST elevations. Nonspecific ST changes, likely demand/rate related.    RADIOLOGY CXR: No acute findings  I also independently reviewed and agree with radiologist interpretations.   PROCEDURES:  Critical Care performed: Yes, see critical care procedure note(s)  .1-3 Lead EKG Interpretation  Performed by: Shaune Pollack, MD Authorized by: Shaune Pollack, MD     Interpretation: normal     ECG rate:  100-120s   ECG rate assessment: tachycardic     Rhythm: sinus tachycardia     Ectopy: none     Conduction: normal   Comments:     Indication: Shortness of breath .Critical Care  Performed by: Shaune Pollack, MD Authorized by: Shaune Pollack, MD   Critical care provider statement:    Critical care time (minutes):  45   Critical care time was exclusive of:  Separately billable procedures and treating other patients   Critical care was necessary to treat or prevent imminent or life-threatening deterioration of the following conditions:  Circulatory failure, cardiac failure and respiratory failure    Critical care was time spent personally by me on the following activities:  Development of treatment plan with patient or surrogate, discussions with consultants, evaluation of patient's response to treatment, examination of patient, ordering and review of laboratory studies, ordering and review of radiographic studies, ordering and performing treatments and interventions, pulse oximetry, re-evaluation of patient's condition and review of old charts   I assumed direction of critical care for this patient from another provider in my specialty: no     Care discussed with: admitting provider       MEDICATIONS ORDERED IN ED: Medications  ipratropium-albuterol (DUONEB) 0.5-2.5 (3) MG/3ML nebulizer solution 3 mL (3 mLs Nebulization Given 03/13/22 1608)  albuterol (PROVENTIL) (2.5 MG/3ML) 0.083% nebulizer solution 2.5 mg (2.5 mg Nebulization Given 03/13/22 1609)  albuterol (PROVENTIL) (2.5 MG/3ML) 0.083% nebulizer solution 2.5 mg (2.5 mg Nebulization Given 03/13/22 1609)  potassium chloride SA (KLOR-CON M) CR tablet 40 mEq (40 mEq Oral Given 03/13/22 1732)  lactated ringers bolus 500 mL (0 mLs Intravenous Stopped 03/13/22 1821)  cefTRIAXone (ROCEPHIN) 2 g in sodium chloride 0.9 % 100 mL IVPB (0 g Intravenous Stopped 03/13/22 1918)  albuterol (PROVENTIL) (2.5 MG/3ML) 0.083% nebulizer solution 2.5 mg (2.5 mg Nebulization Given 03/13/22 1850)  doxycycline (VIBRA-TABS) tablet 100 mg (100 mg Oral Given 03/13/22 1850)  albuterol (PROVENTIL) (2.5 MG/3ML) 0.083% nebulizer solution 2.5 mg (2.5 mg Nebulization Given 03/13/22 2019)     IMPRESSION / MDM / ASSESSMENT AND PLAN / ED COURSE  I reviewed the triage vital signs and the nursing notes.                               The patient is on the cardiac monitor to evaluate for evidence of arrhythmia and/or significant heart rate changes.   Ddx:  Differential includes the following, with pertinent life- or limb-threatening emergencies considered:  COPD exacerbation,  bronchitis, atypical pneumonia, CHF, opportunistic infection less likely as recent CD4 was normal, PE, ACS  Patient's presentation is most consistent with acute presentation with potential threat to life or bodily function.  MDM:  46 year old male with history of COPD here with shortness of breath.  Patient arrives with significant dyspnea, wheezing, and was placed on BiPAP initially for comfort.  He had excellent response to DuoNebs and was very quickly taken off of this.  Clinically, suspect COPD exacerbation/bronchospasm.  Patient labs reviewed as above.  CBC shows leukocytosis, mild, normal hemoglobin.  EKG is nonischemic and troponins negative, do not suspect ACS.  Procalcitonin negative.  Chest x-ray is clear.  Lactic acid normal.  CMP with mild hypokalemia but is otherwise unremarkable.  Patient does have a history of recently diagnosed HIV but had normal CD4 and I do not suspect opportunistic infection.  Following steroids and nebs, patient ambulatory without any hypoxia or difficulty.  His wheezing has essentially resolved.  Like to attempt outpatient management which I think is reasonable.  Will place him on empiric coverage for COPD exacerbation, steroids, and outpatient follow-up.   MEDICATIONS GIVEN IN ED: Medications  ipratropium-albuterol (DUONEB) 0.5-2.5 (3) MG/3ML nebulizer solution 3 mL (3 mLs Nebulization Given 03/13/22 1608)  albuterol (PROVENTIL) (2.5 MG/3ML) 0.083% nebulizer solution 2.5 mg (2.5 mg Nebulization Given 03/13/22 1609)  albuterol (PROVENTIL) (2.5 MG/3ML) 0.083% nebulizer solution 2.5 mg (2.5 mg Nebulization Given 03/13/22 1609)  potassium chloride SA (KLOR-CON M) CR tablet 40 mEq (40 mEq Oral Given 03/13/22 1732)  lactated ringers bolus 500 mL (0 mLs Intravenous Stopped 03/13/22 1821)  cefTRIAXone (ROCEPHIN) 2 g in sodium chloride 0.9 % 100 mL IVPB (0 g Intravenous Stopped 03/13/22 1918)  albuterol (PROVENTIL) (2.5 MG/3ML) 0.083% nebulizer solution 2.5 mg (2.5 mg Nebulization  Given 03/13/22 1850)  doxycycline (VIBRA-TABS) tablet 100 mg (100 mg Oral Given 03/13/22 1850)  albuterol (PROVENTIL) (2.5 MG/3ML) 0.083% nebulizer solution 2.5 mg (2.5 mg Nebulization Given 03/13/22 2019)     Consults:     EMR reviewed  Reviewed recent admission for E. coli colitis, diagnosed with HIV but had reassuring CD4     FINAL CLINICAL IMPRESSION(S) / ED DIAGNOSES   Final diagnoses:  COPD exacerbation (HCC)     Rx / DC Orders   ED Discharge Orders          Ordered    doxycycline (VIBRAMYCIN) 100 MG capsule  2 times daily        03/13/22 2013    predniSONE (DELTASONE) 20 MG tablet  Daily        03/13/22 2013    albuterol (VENTOLIN HFA) 108 (90 Base) MCG/ACT inhaler  Every 4 hours PRN        03/13/22 2013             Note:  This document was prepared using Dragon voice recognition software and may include unintentional dictation errors.   Shaune Pollack, MD 03/13/22 [phone]

## 2022-03-13 NOTE — ED Notes (Signed)
Pt gives verbal consent to DC 

## 2022-03-15 ENCOUNTER — Ambulatory Visit (INDEPENDENT_AMBULATORY_CARE_PROVIDER_SITE_OTHER): Payer: Self-pay

## 2022-03-15 ENCOUNTER — Encounter (INDEPENDENT_AMBULATORY_CARE_PROVIDER_SITE_OTHER): Payer: Self-pay | Admitting: *Deleted

## 2022-03-15 ENCOUNTER — Other Ambulatory Visit: Payer: Self-pay

## 2022-03-15 VITALS — BP 151/98 | HR 87 | Temp 97.4°F | Wt 272.6 lb

## 2022-03-15 DIAGNOSIS — Z006 Encounter for examination for normal comparison and control in clinical research program: Secondary | ICD-10-CM

## 2022-03-15 DIAGNOSIS — A539 Syphilis, unspecified: Secondary | ICD-10-CM

## 2022-03-15 MED ORDER — PENICILLIN G BENZATHINE 1200000 UNIT/2ML IM SUSY
2.4000 10*6.[IU] | PREFILLED_SYRINGE | Freq: Once | INTRAMUSCULAR | Status: AC
  Administered 2022-03-15: 2.4 10*6.[IU] via INTRAMUSCULAR

## 2022-03-15 NOTE — Research (Signed)
Aaron [name] here for his screening visit for the Merck study, [id number]. I explained/reviewed the informed consent with him. Risk, benefits, responsibilities, and other options were reviewed. Verbalized understanding and had no questions at this time. Signed consent was obtained prior to any procedures/evaluations being completed. He is newly diagnosed with HIV and has not received any treatment for his HIV. He has never been on PrEP. We have tentatively scheduled his entry visit for 04/13/2022 pending confirmation of eligibility.

## 2022-03-15 NOTE — Progress Notes (Signed)
Patient in office for Bicillin injection 2.4 MU 2 of 3 for syphilis treatment. Patient tolerated injection well. Patient scheduled at [facility] for 3 of 3 Bicillin 2.4 MU [name] T [name]

## 2022-03-17 ENCOUNTER — Ambulatory Visit: Payer: Medicaid Other

## 2022-03-18 LAB — CULTURE, BLOOD (SINGLE)
Culture: NO GROWTH
Special Requests: ADEQUATE

## 2022-03-22 ENCOUNTER — Ambulatory Visit: Payer: Medicaid Other | Attending: Infectious Diseases

## 2022-03-22 DIAGNOSIS — A539 Syphilis, unspecified: Secondary | ICD-10-CM

## 2022-03-22 DIAGNOSIS — Z006 Encounter for examination for normal comparison and control in clinical research program: Secondary | ICD-10-CM

## 2022-03-22 MED ORDER — PENICILLIN G BENZATHINE 1200000 UNIT/2ML IM SUSY
2.4000 10*6.[IU] | PREFILLED_SYRINGE | Freq: Once | INTRAMUSCULAR | Status: AC
  Administered 2022-03-22: 2.4 10*6.[IU] via INTRAMUSCULAR

## 2022-03-22 NOTE — Progress Notes (Signed)
Patient in office for Bicillin 2.4 MU 3 of 3. Patient tolerated injection well. Patient has no questions or concerns.  Aaron [name] T [name]

## 2022-04-13 ENCOUNTER — Other Ambulatory Visit: Payer: Self-pay

## 2022-04-13 ENCOUNTER — Encounter (INDEPENDENT_AMBULATORY_CARE_PROVIDER_SITE_OTHER): Payer: Self-pay | Admitting: *Deleted

## 2022-04-13 VITALS — Wt 279.1 lb

## 2022-04-13 DIAGNOSIS — Z006 Encounter for examination for normal comparison and control in clinical research program: Secondary | ICD-10-CM

## 2022-04-13 NOTE — Research (Signed)
Molli Hazard here for HIV RNA and Genosure resistance testing screening retest for the Merck [phone]) study. His original VL came back at 112 copies/mL. His VL needs to be between 500 and 100,000 copies/mL to meet study eligibility. We have tentatively scheduled his entry visit for 04/27/22 pending conformation of eligibility.

## 2022-04-26 ENCOUNTER — Telehealth: Payer: Self-pay | Admitting: *Deleted

## 2022-04-26 NOTE — Telephone Encounter (Signed)
Per Lattie Haw from research pt is in need of a new pt appt with a provider here at [facility] due to [employer] and [employer]. Prev seen by Dr. Delaine Lame. Called pt and left VM on Mobile #. If pt calls back we just need to sched a new pt appt with a provider, verify address, and set transportation through [employer]. Pt is eligible and within 64 mi suggested limit.

## 2022-04-28 ENCOUNTER — Ambulatory Visit: Payer: Self-pay | Admitting: Internal Medicine

## 2022-04-28 ENCOUNTER — Encounter: Payer: Self-pay | Admitting: Internal Medicine

## 2022-04-28 ENCOUNTER — Other Ambulatory Visit: Payer: Self-pay

## 2022-04-28 VITALS — BP 171/124 | HR 95 | Temp 98.0°F | Wt 282.2 lb

## 2022-04-28 DIAGNOSIS — B20 Human immunodeficiency virus [HIV] disease: Secondary | ICD-10-CM

## 2022-04-28 DIAGNOSIS — A539 Syphilis, unspecified: Secondary | ICD-10-CM

## 2022-04-28 DIAGNOSIS — Z113 Encounter for screening for infections with a predominantly sexual mode of transmission: Secondary | ICD-10-CM

## 2022-04-28 MED ORDER — BICTEGRAVIR-EMTRICITAB-TENOFOV 50-200-25 MG PO TABS: 1.0000 | ORAL_TABLET | Freq: Every day | ORAL | 11 refills | Status: DC

## 2022-04-28 NOTE — Progress Notes (Signed)
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) 02/16/2022   Sepsis due to Escherichia coli (E. coli) ([place]) 02/15/2022   Obesity (BMI 30-39.9) 02/15/2022   Acute colitis 02/14/2022   Hypokalemia 02/14/2022   Essential hypertension 02/14/2022   AKI (acute kidney injury) ([place]) 02/14/2022   Asthma, chronic 02/14/2022   Type 2 diabetes mellitus with complication, without long-term current use of insulin ([place]) 02/14/2022   Angioedema 04/20/2016      HPI: Aaron [name] is a 46 y.o. male here with new dx hiv  He has been checking hiv every year, and only found out this year that he was positive (during hospital admission 02/2022 for gastroenteritis)  Risk - sex msm. No iv or in drug use.   Social - Working at [employer] for boys [place] "a lot." Smokes cigarette 1 pack per week since age 61 Lives a lone Born/raised in [place]; currently residing in [place] No prior outside of [place] travel; [place] prior travel [place] but been to [place] No pets; neighbors have chicken  Hobbies - fishing (his families have farm in [place])  No current concern with his health except having good appetite and gaining weight      Review of Systems: ROS All other ros negative      Past Medical History:  Diagnosis Date   Asthma    Diabetes mellitus without complication ([place])    Hypertension     Social History   Tobacco Use   Smoking status: Every Day    Types: Cigarettes   Smokeless tobacco: Never  Substance Use Topics   Alcohol use: Yes   Drug use: Yes    Types: Marijuana    No family history on file.  Allergies  Allergen Reactions   Levaquin [Levofloxacin] Itching and Rash    OBJECTIVE: Vitals:   04/28/22 1024  BP: (!) 171/124   Pulse: 95  Temp: 98 F (36.7 C)  TempSrc: Oral  SpO2: 100%  Weight: 282 lb 3.2 oz (128 kg)   Body mass index is 39.36 kg/m.   Physical Exam General/constitutional: no distress, pleasant HEENT: Normocephalic, PER, Conj Clear, EOMI, Oropharynx clear Neck supple CV: rrr no mrg Lungs: clear to auscultation, normal respiratory effort Abd: Soft, Nontender Ext: no edema Skin: No Rash Neuro: nonfocal MSK: no peripheral joint swelling/tenderness/warmth; back spines nontender    Lab: Lab Results  Component Value Date   WBC 11.6 (H) 03/13/2022   HGB 13.1 03/13/2022   HCT 39.9 03/13/2022   MCV 86.9 03/13/2022   PLT 300 09/38/1829   Last metabolic panel Lab Results  Component Value Date   GLUCOSE 144 (H) 03/13/2022   NA 134 (L) 03/13/2022   K 3.2 (L) 03/13/2022   CL 104 03/13/2022   CO2 23 03/13/2022   BUN 14 03/13/2022   CREATININE 1.18 03/13/2022   GFRNONAA >60 03/13/2022   CALCIUM 8.9 03/13/2022   PHOS 2.9 02/16/2022   PROT 8.2 (H) 03/13/2022   ALBUMIN 4.1 03/13/2022   BILITOT 0.6 03/13/2022   ALKPHOS 36 (L) 03/13/2022   AST 26 03/13/2022   ALT 28 03/13/2022   ANIONGAP 7 03/13/2022    HIV: 02/2022      110    /  Microbiology:  Serology: 02/2022 rpr titer 32  Imaging:   Assessment/plan: Problem List Items Addressed This Visit   None Visit Diagnoses     HIV disease (HCC)    -  Primary   Relevant Medications   bictegravir-emtricitabine-tenofovir AF (BIKTARVY) 50-200-25 MG TABS tablet   Other Relevant Orders   Hepatitis B surface antibody,quantitative   Hepatitis B Core Antibody, total   Hepatitis B Surface AntiGEN   Hepatitis C Antibody   Hepatitis A Ab, Total   T-helper cell (CD4)- ([facility] clinic only)   Syphilis       Relevant Medications   bictegravir-emtricitabine-tenofovir AF (BIKTARVY) 50-200-25 MG TABS tablet   Screening for STDs (sexually transmitted diseases)       Relevant Orders   HIV-1 RNA ultraquant reflex to gentyp+    Hepatitis B surface antibody,quantitative   Hepatitis B Core Antibody, total   Hepatitis B Surface AntiGEN   Hepatitis C Antibody   Hepatitis A Ab, Total   Urine cytology ancillary only   Cytology (oral, anal, urethral) ancillary only   Cytology (oral, anal, urethral) ancillary only         #HIV Dx 02/2022. Msm. Per patient he has yearly test and this year this turns positive when he was admitted for acute gastroenteritis. ?acute retroviral syndrome. 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  Raymondo Band, MD [facility] 04/28/2022, 10:32 AM

## 2022-04-28 NOTE — Patient Instructions (Signed)
I have sent your hiv prescription to the [facility] pharmacy on [address] (as that accept [facility]), I have notified them to mail, but please call and let them know   Once you start medication (make sure you take one tablet every day), come back in around 4-6 weeks so we can repeat testing   Today will get other STD labs and some hiv genetic labs

## 2022-04-29 LAB — CYTOLOGY, (ORAL, ANAL, URETHRAL) ANCILLARY ONLY
Chlamydia: NEGATIVE
Chlamydia: NEGATIVE
Comment: NEGATIVE
Comment: NEGATIVE
Comment: NORMAL
Comment: NORMAL
Neisseria Gonorrhea: NEGATIVE
Neisseria Gonorrhea: NEGATIVE

## 2022-04-29 LAB — URINE CYTOLOGY ANCILLARY ONLY
Chlamydia: NEGATIVE
Comment: NEGATIVE
Comment: NORMAL
Neisseria Gonorrhea: NEGATIVE

## 2022-04-29 LAB — T-HELPER CELL (CD4) - (RCID CLINIC ONLY)
CD4 % Helper T Cell: 46 % (ref 33–65)
CD4 T Cell Abs: 885 /uL (ref 400–1790)

## 2022-05-04 LAB — HEPATITIS C ANTIBODY: Hepatitis C Ab: NONREACTIVE

## 2022-05-04 LAB — HEPATITIS B SURFACE ANTIBODY, QUANTITATIVE: Hep B S AB Quant (Post): <5 m[IU]/mL — ABNORMAL LOW (ref >=10)

## 2022-05-04 LAB — HIV-1 RNA ULTRAQUANT REFLEX TO GENTYP+
HIV 1 RNA Quant: 123 copies/mL — ABNORMAL HIGH
HIV-1 RNA Quant, Log: 2.09 Log copies/mL — ABNORMAL HIGH

## 2022-05-04 LAB — HEPATITIS B SURFACE ANTIGEN: Hepatitis B Surface Ag: NONREACTIVE

## 2022-05-04 LAB — HEPATITIS B CORE ANTIBODY, TOTAL: Hep B Core Total Ab: NONREACTIVE

## 2022-05-04 LAB — HEPATITIS A ANTIBODY, TOTAL: Hepatitis A AB,Total: NONREACTIVE

## 2022-05-10 ENCOUNTER — Encounter: Payer: Self-pay | Admitting: Internal Medicine

## 2022-06-03 LAB — GC/CHLAMYDIA PROBE AMP
Chlamydia trachomatis, NAA: NEGATIVE
Neisseria Gonorrhoeae by PCR: NEGATIVE

## 2022-06-15 ENCOUNTER — Ambulatory Visit: Payer: Medicaid Other

## 2022-06-15 ENCOUNTER — Ambulatory Visit (INDEPENDENT_AMBULATORY_CARE_PROVIDER_SITE_OTHER): Payer: Self-pay | Admitting: Internal Medicine

## 2022-06-15 ENCOUNTER — Other Ambulatory Visit: Payer: Self-pay

## 2022-06-15 ENCOUNTER — Encounter: Payer: Self-pay | Admitting: Internal Medicine

## 2022-06-15 VITALS — BP 162/114 | HR 93 | Temp 98.1°F | Ht 72.0 in | Wt 291.0 lb

## 2022-06-15 DIAGNOSIS — Z23 Encounter for immunization: Secondary | ICD-10-CM

## 2022-06-15 DIAGNOSIS — B20 Human immunodeficiency virus [HIV] disease: Secondary | ICD-10-CM

## 2022-06-15 NOTE — Patient Instructions (Signed)
Covid booster and hepatitis b vaccine series today   Nurse visit for 2nd shot hep b vaccine     See me in 3 months Continue biktarvy  Lab today

## 2022-06-15 NOTE — Progress Notes (Signed)
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 coli) (HCC) 02/15/2022   Obesity (BMI 30-39.9) 02/15/2022   Acute colitis 02/14/2022   Hypokalemia 02/14/2022   Essential hypertension 02/14/2022   AKI (acute kidney injury) (HCC) 02/14/2022   Asthma, chronic 02/14/2022   Type 2 diabetes mellitus with complication, without long-term current use of insulin (HCC) 02/14/2022   Angioedema 04/20/2016   Cc -- ongoing hiv care   HPI: Aaron [name] is a 46 y.o. male here for ongoing hiv care  06/15/22 id clinic f/u Patient didn't qualify for the merck 053 study as his viral load was too low He started on biktarvy since last visit. Tolerating it well. No missed dose last 4 weeks No complaint today   Social -- currently sexually active steady relationship with same partner 2 years; partner is seronegative. Condom.  Patient is not concerned about std today and just wants hiv testing   I initially saw him new patient intake on 04/28/22: ------------ He has been checking hiv every year, and only found out this year that he was positive (during hospital admission 02/2022 for gastroenteritis)  Risk - sex msm. No iv or in drug use.   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 Uses marijuanna "a lot." Smokes cigarette 1 pack per week since age 77 Lives a lone Born/raised in [place]; currently residing in [place] No prior outside of [place] travel; [place] prior travel [place] but been to [place] No pets; neighbors have chicken  Hobbies - fishing (his families have farm in [place])  No current concern with his health except having good appetite and gaining weight      Review of Systems: ROS All other ros negative      Past Medical History:  Diagnosis Date   Asthma    Diabetes  mellitus without complication (HCC)    Hypertension     Social History   Tobacco Use   Smoking status: Every Day    Types: Cigarettes   Smokeless tobacco: Never  Substance Use Topics   Alcohol use: Yes   Drug use: Yes    Types: Marijuana    No family history on file.  Allergies  Allergen Reactions   Levaquin [Levofloxacin] Itching and Rash    OBJECTIVE: Vitals:   06/15/22 1458  BP: (!) 162/114  Pulse: 93  Temp: 98.1 F (36.7 C)  TempSrc: Oral  Weight: 291 lb (132 kg)  Height: 6' (1.829 m)   Body mass index is 39.47 kg/m.   Physical Exam General/constitutional: no distress, pleasant; obese HEENT: Normocephalic, PER, Conj Clear, EOMI, Oropharynx clear Neck supple CV: rrr no mrg Lungs: clear to auscultation, normal respiratory effort Abd: Soft, Nontender Ext: no edema Skin: No Rash Neuro: nonfocal MSK: no peripheral joint swelling/tenderness/warmth; back spines nontender     Lab: Lab Results  Component Value Date   WBC 11.6 (H) 03/13/2022   HGB 13.1 03/13/2022   HCT 39.9 03/13/2022   MCV 86.9 03/13/2022   PLT 300 03/13/2022   Last metabolic panel Lab Results  Component Value Date   GLUCOSE 144 (H) 03/13/2022   NA 134 (L) 03/13/2022   K 3.2 (L) 03/13/2022   CL 104 03/13/2022   CO2 23 03/13/2022  BUN 14 03/13/2022   CREATININE 1.18 03/13/2022   GFRNONAA >60 03/13/2022   CALCIUM 8.9 03/13/2022   PHOS 2.9 02/16/2022   PROT 8.2 (H) 03/13/2022   ALBUMIN 4.1 03/13/2022   BILITOT 0.6 03/13/2022   ALKPHOS 36 (L) 03/13/2022   AST 26 03/13/2022   ALT 28 03/13/2022   ANIONGAP 7 03/13/2022    HIV: 04/2022               /      885  (46%) 02/2022      110    /      Microbiology:  Serology: 02/2022 rpr titer 32  Imaging:   Assessment/plan: Problem List Items Addressed This Visit   None Visit Diagnoses     HIV disease (HCC)    -  Primary   Relevant Orders   HIV 1 RNA quant-no reflex-bld   Hepatitis B vaccination administered at  current visit            #HIV Dx 02/2022. Msm. Per patient he has yearly test and this year this turns positive when he was admitted for acute gastroenteritis. ?acute retroviral syndrome. 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  Raymondo Band, MD [facility] 06/15/2022, 3:09 PM

## 2022-06-15 NOTE — Addendum Note (Signed)
Addended by: Tressa Busman T on: 06/15/2022 03:43 PM   Modules accepted: Orders

## 2022-06-18 LAB — HIV-1 RNA QUANT-NO REFLEX-BLD
HIV 1 RNA Quant: NOT DETECTED Copies/mL
HIV-1 RNA Quant, Log: NOT DETECTED Log cps/mL

## 2022-07-08 DIAGNOSIS — Z419 Encounter for procedure for purposes other than remedying health state, unspecified: Secondary | ICD-10-CM | POA: Diagnosis not present

## 2022-08-03 ENCOUNTER — Other Ambulatory Visit: Payer: Self-pay | Admitting: Internal Medicine

## 2022-08-04 ENCOUNTER — Other Ambulatory Visit: Payer: Self-pay | Admitting: Internal Medicine

## 2022-08-04 NOTE — Telephone Encounter (Signed)
One year supply sent in September. Will call pharmacy

## 2022-08-08 DIAGNOSIS — Z419 Encounter for procedure for purposes other than remedying health state, unspecified: Secondary | ICD-10-CM | POA: Diagnosis not present

## 2022-08-09 ENCOUNTER — Other Ambulatory Visit: Payer: Self-pay

## 2022-08-09 DIAGNOSIS — B20 Human immunodeficiency virus [HIV] disease: Secondary | ICD-10-CM

## 2022-08-09 MED ORDER — BICTEGRAVIR-EMTRICITAB-TENOFOV 50-200-25 MG PO TABS: 1.0000 | ORAL_TABLET | Freq: Every day | ORAL | 3 refills | Status: DC

## 2022-08-10 ENCOUNTER — Other Ambulatory Visit: Payer: Self-pay | Admitting: Internal Medicine

## 2022-08-10 DIAGNOSIS — B20 Human immunodeficiency virus [HIV] disease: Secondary | ICD-10-CM

## 2022-08-17 DIAGNOSIS — E6609 Other obesity due to excess calories: Secondary | ICD-10-CM | POA: Diagnosis not present

## 2022-08-17 DIAGNOSIS — Z794 Long term (current) use of insulin: Secondary | ICD-10-CM | POA: Diagnosis not present

## 2022-08-17 DIAGNOSIS — Z13220 Encounter for screening for lipoid disorders: Secondary | ICD-10-CM | POA: Diagnosis not present

## 2022-08-17 DIAGNOSIS — B20 Human immunodeficiency virus [HIV] disease: Secondary | ICD-10-CM | POA: Diagnosis not present

## 2022-08-17 DIAGNOSIS — E119 Type 2 diabetes mellitus without complications: Secondary | ICD-10-CM | POA: Diagnosis not present

## 2022-08-17 DIAGNOSIS — I10 Essential (primary) hypertension: Secondary | ICD-10-CM | POA: Diagnosis not present

## 2022-08-18 ENCOUNTER — Telehealth: Payer: Self-pay

## 2022-08-18 ENCOUNTER — Other Ambulatory Visit: Payer: Self-pay

## 2022-08-18 DIAGNOSIS — Z1211 Encounter for screening for malignant neoplasm of colon: Secondary | ICD-10-CM

## 2022-08-18 MED ORDER — NA SULFATE-K SULFATE-MG SULF 17.5-3.13-1.6 GM/177ML PO SOLN: 1.0000 | Freq: Once | ORAL | 0 refills | Status: AC

## 2022-08-18 NOTE — Telephone Encounter (Signed)
Okay to continue with the prep because he will be using this prep just for the colonoscopy and not long-term  RV

## 2022-08-18 NOTE — Telephone Encounter (Signed)
Gastroenterology Pre-Procedure Review  Request Date: 09/05/22 Requesting Physician: Dr. Marius Ditch  PATIENT REVIEW QUESTIONS: The patient responded to the following health history questions as indicated:    1. Are you having any GI issues? no 2. Do you have a personal history of Polyps? no 3. Do you have a family history of Colon Cancer or Polyps? yes (grandfather colon cancer) 4. Diabetes Mellitus? no 5. Joint replacements in the past 12 months?no 6. Major health problems in the past 3 months?no 7. Any artificial heart valves, MVP, or defibrillator?no    MEDICATIONS & ALLERGIES:    Patient reports the following regarding taking any anticoagulation/antiplatelet therapy:   Plavix, Coumadin, Eliquis, Xarelto, Lovenox, Pradaxa, Brilinta, or Effient? no Aspirin? no  Patient confirms/reports the following medications:  Current Outpatient Medications  Medication Sig Dispense Refill   albuterol (VENTOLIN HFA) 108 (90 Base) MCG/ACT inhaler Inhale 2 puffs into the lungs every 6 (six) hours as needed for wheezing or shortness of breath. 8 g 2   albuterol (VENTOLIN HFA) 108 (90 Base) MCG/ACT inhaler Inhale 2 puffs into the lungs every 4 (four) hours as needed for wheezing or shortness of breath. 8 g 2   bictegravir-emtricitabine-tenofovir AF (BIKTARVY) 50-200-25 MG TABS tablet Take 1 tablet by mouth daily. 30 tablet 3   colchicine 0.6 MG tablet Take 1 tablet (0.6 mg total) by mouth 2 (two) times daily. 60 tablet 2   No current facility-administered medications for this visit.    Patient confirms/reports the following allergies:  Allergies  Allergen Reactions   Levaquin [Levofloxacin] Itching and Rash    No orders of the defined types were placed in this encounter.   AUTHORIZATION INFORMATION Primary Insurance: 1D#: Group #:  Secondary Insurance: 1D#: Group #:  SCHEDULE INFORMATION: Date: 09/05/22 Time: Location: [facility]

## 2022-08-30 ENCOUNTER — Encounter: Payer: Self-pay | Admitting: *Deleted

## 2022-08-30 ENCOUNTER — Other Ambulatory Visit: Payer: Self-pay

## 2022-08-30 ENCOUNTER — Emergency Department
Admission: EM | Admit: 2022-08-30 | Discharge: 2022-08-30 | Disposition: A | Discharge status: Home / Self Care | Payer: Medicaid Other | Attending: Emergency Medicine | Admitting: Emergency Medicine

## 2022-08-30 ENCOUNTER — Emergency Department: Payer: Medicaid Other

## 2022-08-30 DIAGNOSIS — J4521 Mild intermittent asthma with (acute) exacerbation: Secondary | ICD-10-CM | POA: Diagnosis not present

## 2022-08-30 DIAGNOSIS — R0602 Shortness of breath: Secondary | ICD-10-CM | POA: Diagnosis not present

## 2022-08-30 DIAGNOSIS — R079 Chest pain, unspecified: Secondary | ICD-10-CM | POA: Diagnosis not present

## 2022-08-30 DIAGNOSIS — I10 Essential (primary) hypertension: Secondary | ICD-10-CM | POA: Insufficient documentation

## 2022-08-30 DIAGNOSIS — Z1152 Encounter for screening for COVID-19: Secondary | ICD-10-CM | POA: Diagnosis not present

## 2022-08-30 DIAGNOSIS — E119 Type 2 diabetes mellitus without complications: Secondary | ICD-10-CM | POA: Diagnosis not present

## 2022-08-30 LAB — BASIC METABOLIC PANEL
Anion gap: 12 (ref 5–15)
BUN: 22 mg/dL — ABNORMAL HIGH (ref 6–20)
CO2: 28 mmol/L (ref 22–32)
Calcium: 9.5 mg/dL (ref 8.9–10.3)
Chloride: 100 mmol/L (ref 98–111)
Creatinine, Ser: 1.7 mg/dL — ABNORMAL HIGH (ref 0.61–1.24)
GFR, Estimated: 50 mL/min — ABNORMAL LOW (ref >60)
Glucose, Bld: 101 mg/dL — ABNORMAL HIGH (ref 70–99)
Potassium: 3 mmol/L — ABNORMAL LOW (ref 3.5–5.1)
Sodium: 140 mmol/L (ref 135–145)

## 2022-08-30 LAB — RESP PANEL BY RT-PCR (RSV, FLU A&B, COVID)  RVPGX2
Influenza A by PCR: NEGATIVE
Influenza B by PCR: NEGATIVE
Resp Syncytial Virus by PCR: NEGATIVE
SARS Coronavirus 2 by RT PCR: NEGATIVE

## 2022-08-30 LAB — TROPONIN I (HIGH SENSITIVITY): Troponin I (High Sensitivity): 14 ng/L (ref <18)

## 2022-08-30 LAB — CBC
HCT: 44.4 % (ref 39.0–52.0)
Hemoglobin: 14.3 g/dL (ref 13.0–17.0)
MCH: 28.6 pg (ref 26.0–34.0)
MCHC: 32.2 g/dL (ref 30.0–36.0)
MCV: 88.8 fL (ref 80.0–100.0)
Platelets: 345 10*3/uL (ref 150–400)
RBC: 5 MIL/uL (ref 4.22–5.81)
RDW: 15.1 % (ref 11.5–15.5)
WBC: 8.2 10*3/uL (ref 4.0–10.5)
nRBC: 0 % (ref 0.0–0.2)

## 2022-08-30 LAB — BRAIN NATRIURETIC PEPTIDE: B Natriuretic Peptide: 13.2 pg/mL (ref 0.0–100.0)

## 2022-08-30 MED ORDER — IPRATROPIUM-ALBUTEROL 0.5-2.5 (3) MG/3ML IN SOLN: 3.0000 mL | RESPIRATORY_TRACT | 3 refills | Status: AC | PRN

## 2022-08-30 MED ORDER — IPRATROPIUM-ALBUTEROL 0.5-2.5 (3) MG/3ML IN SOLN
3.0000 mL | Freq: Once | RESPIRATORY_TRACT | Status: AC
  Administered 2022-08-30: 3 mL via RESPIRATORY_TRACT
  Filled 2022-08-30: qty 3

## 2022-08-30 MED ORDER — PREDNISONE 10 MG (21) PO TBPK: ORAL_TABLET | ORAL | 0 refills | Status: DC

## 2022-08-30 MED ORDER — ALBUTEROL SULFATE HFA 108 (90 BASE) MCG/ACT IN AERS: 2.0000 | INHALATION_SPRAY | Freq: Four times a day (QID) | RESPIRATORY_TRACT | 2 refills | Status: AC | PRN

## 2022-08-30 NOTE — ED Triage Notes (Signed)
Pt reports sob and chest pain for 3 days.  Hx asthma.  Pt lost inhalers.  Pt also has lower back pain.   No known injury.   Pt alert  speech clear.

## 2022-08-30 NOTE — ED Provider Notes (Signed)
[facility] Provider Note    Event Date/Time   First MD Initiated Contact with Patient 08/30/22 1818     (approximate)   History   Shortness of Breath and Chest Pain   HPI  Aaron [name] is a 47 y.o. male with history of diabetes, hypertension and asthma presents emergency department with some chest pain or shortness of breath for 3 days.  Patient states he lost his albuterol inhaler.  The pharmacy has been out of his Advair.  He does have a prescription for Advair.  States for difficulty breathing at night.  Denies fever, chills, vomiting or diarrhea      Physical Exam   Triage Vital Signs: ED Triage Vitals  Enc Vitals Group     BP 08/30/22 1616 (!) 124/94     Pulse Rate 08/30/22 1616 (!) 105     Resp 08/30/22 1616 20     Temp 08/30/22 1616 98.2 F (36.8 C)     Temp Source 08/30/22 1616 Oral     SpO2 08/30/22 1616 96 %     Weight 08/30/22 1613 292 lb (132.5 kg)     Height 08/30/22 1613 6' (1.829 m)     Head Circumference --      Peak Flow --      Pain Score 08/30/22 1613 7     Pain Loc --      Pain Edu? --      Excl. in [place]? --     Most recent vital signs: Vitals:   08/30/22 1616  BP: (!) 124/94  Pulse: (!) 105  Resp: 20  Temp: 98.2 F (36.8 C)  SpO2: 96%     General: Awake, no distress.   CV:  Good peripheral perfusion. regular rate and  rhythm Resp:  Normal effort. Lungs with decreased lung sounds bilaterally Abd:  No distention.   Other:      ED Results / Procedures / Treatments   Labs (all labs ordered are listed, but only abnormal results are displayed) Labs Reviewed  BASIC METABOLIC PANEL - Abnormal; Notable for the following components:      Result Value   Potassium 3.0 (*)    Glucose, Bld 101 (*)    BUN 22 (*)    Creatinine, Ser 1.70 (*)    GFR, Estimated 50 (*)    All other components within normal limits  RESP PANEL BY RT-PCR (RSV, FLU A&B, COVID)  RVPGX2  CBC  BRAIN NATRIURETIC PEPTIDE  TROPONIN I  (HIGH SENSITIVITY)  TROPONIN I (HIGH SENSITIVITY)     EKG  EKG   RADIOLOGY Chest x-ray    PROCEDURES:   Procedures   MEDICATIONS ORDERED IN ED: Medications  ipratropium-albuterol (DUONEB) 0.5-2.5 (3) MG/3ML nebulizer solution 3 mL (3 mLs Nebulization Given 08/30/22 1850)     IMPRESSION / MDM / ASSESSMENT AND PLAN / ED COURSE  I reviewed the triage vital signs and the nursing notes.                              Differential diagnosis includes, but is not limited to, asthma exacerbation, COVID, influenza, RSV, CAP, MI  Patient's presentation is most consistent with acute presentation with potential threat to life or bodily function.   Patient's labs are reassuring and in his normal trend, potassium was decreased but this is his normal level when I did review his past medical charts he has had several lab  levels of potassium of 3 and 3.2  Chest x-ray was independently reviewed and interpreted by me as being negative for any acute abnormality.  Confirmed by radiology   EKG shows sinus tachycardia, no STEMI, no QT prolongation  I gave patient a DuoNeb while here in the ED.  States he feels much better.  Feel that MI is less likely as his labs are normal and he does not exhibit as an ACS patient.  Patient's respiratory panel was negative so he does not need an antibiotic or antiviral at this time.  Since patient had such relief with the DuoNeb nebulizer I will give him a prescription for DME nebulizer machine, DuoNeb Nebules, albuterol inhaler, and Sterapred.  He is to follow-up with his regular doctor if not improving 3 days.  Return emergency department worsening.  He was given a work note and discharged stable condition.   FINAL CLINICAL IMPRESSION(S) / ED DIAGNOSES   Final diagnoses:  Mild intermittent asthma with acute exacerbation     Rx / DC Orders   ED Discharge Orders          Ordered    albuterol (VENTOLIN HFA) 108 (90 Base) MCG/ACT inhaler  Every 6 hours  PRN        08/30/22 1943    ipratropium-albuterol (DUONEB) 0.5-2.5 (3) MG/3ML SOLN  Every 4 hours PRN        08/30/22 1943    For home use only DME Nebulizer machine        08/30/22 1943    predniSONE (STERAPRED UNI-PAK 21 TAB) 10 MG (21) TBPK tablet        08/30/22 1943             Note:  This document was prepared using Dragon voice recognition software and may include unintentional dictation errors.    Versie Starks, PA-C 08/30/22 1949    Naaman Plummer, MD 08/30/22 [phone]

## 2022-08-30 NOTE — ED Provider Triage Note (Signed)
Emergency Medicine Provider Triage Evaluation Note  Aaron [name] , a 47 y.o. male  was evaluated in triage.  Pt complains of CP, shob, back pain. Patient with asthma HX but no inhalers. No reported fevers.  Review of Systems  Positive: CP, shob Negative: Abd pain, fever  Physical Exam  BP (!) 124/94 (BP Location: Left Arm)   Pulse (!) 105   Temp 98.2 F (36.8 C) (Oral)   Resp 20   Ht 6' (1.829 m)   Wt 132.5 kg   SpO2 96%   BMI 39.60 kg/m  Gen:   Awake, no distress   Resp:  Normal effort  MSK:   Moves extremities without difficulty  Other:    Medical Decision Making  Medically screening exam initiated at 4:19 PM.  Appropriate orders placed.  Lawana Chambers was informed that the remainder of the evaluation will be completed by another provider, this initial triage assessment does not replace that evaluation, and the importance of remaining in the ED until their evaluation is complete.  Labs, EKG, chest xray   Darletta Moll, PA-C 08/30/22 1619

## 2022-08-30 NOTE — ED Notes (Signed)
See triage note  Presents with some SOB and some discomfort into lower back  Sx's started 3 days ago  Afebrile on arrival

## 2022-08-31 ENCOUNTER — Telehealth: Payer: Self-pay | Admitting: Gastroenterology

## 2022-08-31 MED ORDER — NA SULFATE-K SULFATE-MG SULF 17.5-3.13-1.6 GM/177ML PO SOLN: 1.0000 | Freq: Once | ORAL | 0 refills | Status: AC

## 2022-08-31 NOTE — Telephone Encounter (Signed)
Returned patients phone call to reschedule his colonoscopy.  Colonoscopy has been rescheduled to 09/21/22.  Pt has been asked to update his instructions to reflect the new procedure date just in case new instructions do not arrive in time.  [facility] notified of date change.  Thanks, [place], [place]

## 2022-08-31 NOTE — Telephone Encounter (Signed)
Patient requests call back to reschedule upcoming colonoscopy procedure.

## 2022-08-31 NOTE — Addendum Note (Signed)
Addended by: Vanetta Mulders on: 08/31/2022 04:01 PM   Modules accepted: Orders

## 2022-09-08 DIAGNOSIS — Z419 Encounter for procedure for purposes other than remedying health state, unspecified: Secondary | ICD-10-CM | POA: Diagnosis not present

## 2022-09-15 ENCOUNTER — Other Ambulatory Visit: Payer: Self-pay | Admitting: Internal Medicine

## 2022-09-15 ENCOUNTER — Ambulatory Visit: Payer: Medicaid Other | Admitting: Internal Medicine

## 2022-09-15 DIAGNOSIS — B20 Human immunodeficiency virus [HIV] disease: Secondary | ICD-10-CM

## 2022-09-20 DIAGNOSIS — M10072 Idiopathic gout, left ankle and foot: Secondary | ICD-10-CM | POA: Diagnosis not present

## 2022-09-20 DIAGNOSIS — R600 Localized edema: Secondary | ICD-10-CM | POA: Diagnosis not present

## 2022-09-21 ENCOUNTER — Ambulatory Visit
Admission: RE | Admit: 2022-09-21 | Discharge: 2022-09-21 | Disposition: A | Discharge status: Home / Self Care | Payer: Medicaid Other | Source: Ambulatory Visit | Attending: Gastroenterology | Admitting: Gastroenterology

## 2022-09-21 ENCOUNTER — Ambulatory Visit: Payer: Medicaid Other | Admitting: Anesthesiology

## 2022-09-21 ENCOUNTER — Encounter: Payer: Self-pay | Admitting: Gastroenterology

## 2022-09-21 ENCOUNTER — Encounter
Admission: RE | Disposition: A | Discharge status: Home / Self Care | Payer: Self-pay | Source: Ambulatory Visit | Attending: Gastroenterology

## 2022-09-21 ENCOUNTER — Other Ambulatory Visit: Payer: Self-pay

## 2022-09-21 DIAGNOSIS — I10 Essential (primary) hypertension: Secondary | ICD-10-CM | POA: Diagnosis not present

## 2022-09-21 DIAGNOSIS — J45909 Unspecified asthma, uncomplicated: Secondary | ICD-10-CM | POA: Diagnosis not present

## 2022-09-21 DIAGNOSIS — F17210 Nicotine dependence, cigarettes, uncomplicated: Secondary | ICD-10-CM | POA: Diagnosis not present

## 2022-09-21 DIAGNOSIS — E119 Type 2 diabetes mellitus without complications: Secondary | ICD-10-CM | POA: Diagnosis not present

## 2022-09-21 DIAGNOSIS — Z7984 Long term (current) use of oral hypoglycemic drugs: Secondary | ICD-10-CM | POA: Diagnosis not present

## 2022-09-21 DIAGNOSIS — Z79899 Other long term (current) drug therapy: Secondary | ICD-10-CM | POA: Insufficient documentation

## 2022-09-21 DIAGNOSIS — G4730 Sleep apnea, unspecified: Secondary | ICD-10-CM | POA: Diagnosis not present

## 2022-09-21 DIAGNOSIS — K5730 Diverticulosis of large intestine without perforation or abscess without bleeding: Secondary | ICD-10-CM | POA: Diagnosis not present

## 2022-09-21 DIAGNOSIS — Z1211 Encounter for screening for malignant neoplasm of colon: Secondary | ICD-10-CM | POA: Diagnosis not present

## 2022-09-21 DIAGNOSIS — Z6838 Body mass index (BMI) 38.0-38.9, adult: Secondary | ICD-10-CM | POA: Insufficient documentation

## 2022-09-21 DIAGNOSIS — K5790 Diverticulosis of intestine, part unspecified, without perforation or abscess without bleeding: Secondary | ICD-10-CM | POA: Diagnosis not present

## 2022-09-21 HISTORY — PX: COLONOSCOPY WITH PROPOFOL: SHX5780

## 2022-09-21 SURGERY — COLONOSCOPY WITH PROPOFOL
Anesthesia: General

## 2022-09-21 MED ORDER — PROPOFOL 10 MG/ML IV BOLUS
INTRAVENOUS | Status: DC | PRN
  Administered 2022-09-21: 70 mg via INTRAVENOUS

## 2022-09-21 MED ORDER — DEXMEDETOMIDINE HCL IN NACL 200 MCG/50ML IV SOLN
INTRAVENOUS | Status: DC | PRN
  Administered 2022-09-21: 20 ug via INTRAVENOUS

## 2022-09-21 MED ORDER — SODIUM CHLORIDE 0.9 % IV SOLN: INTRAVENOUS | Status: DC

## 2022-09-21 MED ORDER — PROPOFOL 500 MG/50ML IV EMUL
INTRAVENOUS | Status: DC | PRN
  Administered 2022-09-21: 150 ug/kg/min via INTRAVENOUS

## 2022-09-21 NOTE — Transfer of Care (Signed)
Immediate Anesthesia Transfer of Care Note  Patient: Aaron [name]  Procedure(s) Performed: COLONOSCOPY WITH PROPOFOL  Patient Location: PACU  Anesthesia Type:MAC  Level of Consciousness: awake, alert , and oriented  Airway & Oxygen Therapy: Patient Spontanous Breathing and Patient connected to nasal cannula oxygen  Post-op Assessment: Report given to RN and Post -op Vital signs reviewed and stable  Post vital signs: Reviewed and stable  Last Vitals:  Vitals Value Taken Time  BP 164/107 09/21/22 0924  Temp 35.6 C 09/21/22 0924  Pulse 85 09/21/22 0924  Resp 19 09/21/22 0924  SpO2 100 % 09/21/22 0924    Last Pain:  Vitals:   09/21/22 0924  TempSrc: Temporal  PainSc: Asleep         Complications: No notable events documented.

## 2022-09-21 NOTE — Anesthesia Preprocedure Evaluation (Signed)
Anesthesia Evaluation  Patient identified by MRN, date of birth, ID band Patient awake    Reviewed: Allergy & Precautions, NPO status , Patient's Chart, lab work & pertinent test results  Airway Mallampati: II  TM Distance: >3 FB Neck ROM: Full    Dental  (+) Teeth Intact,    Pulmonary neg pulmonary ROS, asthma , sleep apnea , Current Smoker and Patient abstained from smoking.   Pulmonary exam normal  + decreased breath sounds      Cardiovascular Exercise Tolerance: Good hypertension, Pt. on medications negative cardio ROS Normal cardiovascular exam Rhythm:Regular Rate:Normal     Neuro/Psych negative neurological ROS  negative psych ROS   GI/Hepatic negative GI ROS, Neg liver ROS,,,  Endo/Other  negative endocrine ROSdiabetes, Well Controlled, Type 2, Oral Hypoglycemic Agents  Morbid obesity  Renal/GU negative Renal ROS  negative genitourinary   Musculoskeletal negative musculoskeletal ROS (+)    Abdominal  (+) + obese  Peds negative pediatric ROS (+)  Hematology negative hematology ROS (+)   Anesthesia Other Findings Past Medical History: No date: Asthma No date: Diabetes mellitus without complication (HCC) No date: Hypertension  History reviewed. No pertinent surgical history.     Reproductive/Obstetrics negative OB ROS                             Anesthesia Physical Anesthesia Plan  ASA: 3  Anesthesia Plan: General   Post-op Pain Management:    Induction: Intravenous  PONV Risk Score and Plan: Propofol infusion and TIVA  Airway Management Planned: Natural Airway and Nasal Cannula  Additional Equipment:   Intra-op Plan:   Post-operative Plan:   Informed Consent: I have reviewed the patients History and Physical, chart, labs and discussed the procedure including the risks, benefits and alternatives for the proposed anesthesia with the patient or authorized  representative who has indicated his/her understanding and acceptance.     Dental Advisory Given  Plan Discussed with: CRNA and Surgeon  Anesthesia Plan Comments:        Anesthesia Quick Evaluation

## 2022-09-21 NOTE — Op Note (Signed)
[facility] Gastroenterology Patient Name: Aaron [name] Procedure Date: 09/21/2022 8:41 AM MRN: [id number] Account #: [id number] Date of Birth: 1975-11-10 Admit Type: Outpatient Age: 47 Room: [facility] ENDO ROOM 4 Gender: Male Note Status: Finalized Instrument Name: Jasper Riling [id number] Procedure:             Colonoscopy Indications:           Screening for colorectal malignant neoplasm, This is                         the patient's first colonoscopy Providers:             Lin Landsman MD, MD Referring MD:          No Local Md, MD (Referring MD) Medicines:             General Anesthesia Complications:         No immediate complications. Estimated blood loss: None. Procedure:             Pre-Anesthesia Assessment:                        - Prior to the procedure, a History and Physical was                         performed, and patient medications and allergies were                         reviewed. The patient is competent. The risks and                         benefits of the procedure and the sedation options and                         risks were discussed with the patient. All questions                         were answered and informed consent was obtained.                         Patient identification and proposed procedure were                         verified by the physician, the nurse, the                         anesthesiologist, the anesthetist and the technician                         in the pre-procedure area in the procedure room in the                         endoscopy suite. Mental Status Examination: alert and                         oriented. Airway Examination: normal oropharyngeal                         airway and neck mobility. Respiratory Examination:  clear to auscultation. CV Examination: normal.                         Prophylactic Antibiotics: The patient does not require                         prophylactic  antibiotics. Prior Anticoagulants: The                         patient has taken no anticoagulant or antiplatelet                         agents. ASA Grade Assessment: III - A patient with                         severe systemic disease. After reviewing the risks and                         benefits, the patient was deemed in satisfactory                         condition to undergo the procedure. The anesthesia                         plan was to use general anesthesia. Immediately prior                         to administration of medications, the patient was                         re-assessed for adequacy to receive sedatives. The                         heart rate, respiratory rate, oxygen saturations,                         blood pressure, adequacy of pulmonary ventilation, and                         response to care were monitored throughout the                         procedure. The physical status of the patient was                         re-assessed after the procedure.                        After obtaining informed consent, the colonoscope was                         passed under direct vision. Throughout the procedure,                         the patient's blood pressure, pulse, and oxygen                         saturations were monitored continuously. The  Colonoscope was introduced through the anus and                         advanced to the the cecum, identified by appendiceal                         orifice and ileocecal valve. The colonoscopy was                         performed without difficulty. The patient tolerated                         the procedure well. The quality of the bowel                         preparation was evaluated using the BBPS [facility] Bowel                         Preparation Scale) with scores of: Right Colon = 2                         (minor amount of residual staining, small fragments of                         stool  and/or opaque liquid, but mucosa seen well),                         Transverse Colon = 3 (entire mucosa seen well with no                         residual staining, small fragments of stool or opaque                         liquid) and Left Colon = 2 (minor amount of residual                         staining, small fragments of stool and/or opaque                         liquid, but mucosa seen well). The total BBPS score                         equals 7. The ileocecal valve, appendiceal orifice,                         and rectum were photographed. Findings:      The perianal and digital rectal examinations were normal. Pertinent       negatives include normal sphincter tone and no palpable rectal lesions.      A few small-mouthed diverticula were found in the entire colon.      The retroflexed view of the distal rectum and anal verge was normal and       showed no anal or rectal abnormalities.      A large amount of liquid stool was found in the entire colon, precluding       visualization. Impression:            - Diverticulosis  in the entire examined colon.                        - The distal rectum and anal verge are normal on                         retroflexion view.                        - Stool in the entire examined colon.                        - No specimens collected. Recommendation:        - Discharge patient to home (with escort).                        - Resume previous diet today.                        - Continue present medications.                        - Repeat colonoscopy in 5 years with 2 day prep for                         screening purposes. Procedure Code(s):     --- Professional ---                        [id number], Colorectal cancer screening; colonoscopy on                         individual not meeting criteria for high risk Diagnosis Code(s):     --- Professional ---                        Z12.11, Encounter for screening for malignant neoplasm                          of colon                        K57.30, Diverticulosis of large intestine without                         perforation or abscess without bleeding CPT copyright 2022 American Medical Association. All rights reserved. The codes documented in this report are preliminary and upon coder review may  be revised to meet current compliance requirements. Dr. Ulyess Mort Lin Landsman MD, MD 09/21/2022 9:15:40 AM This report has been signed electronically. Number of Addenda: 0 Note Initiated On: 09/21/2022 8:41 AM Scope Withdrawal Time: 0 hours 7 minutes 29 seconds  Total Procedure Duration: 0 hours 12 minutes 3 seconds  Estimated Blood Loss:  Estimated blood loss: none.      [facility]

## 2022-09-21 NOTE — Anesthesia Postprocedure Evaluation (Signed)
Anesthesia Post Note  Patient: Aaron [name]  Procedure(s) Performed: COLONOSCOPY WITH PROPOFOL  Patient location during evaluation: PACU Anesthesia Type: General Level of consciousness: awake and awake and alert Pain management: satisfactory to patient Vital Signs Assessment: post-procedure vital signs reviewed and stable Respiratory status: spontaneous breathing and respiratory function stable Cardiovascular status: stable Anesthetic complications: no   No notable events documented.   Last Vitals:  Vitals:   09/21/22 0924 09/21/22 0934  BP: (!) 164/107 (!) 156/98  Pulse: 85 81  Resp: 19 20  Temp: (!) 35.6 C   SpO2: 100% 100%    Last Pain:  Vitals:   09/21/22 0934  TempSrc:   PainSc: 0-No pain                 VAN STAVEREN,Krishawna Stiefel

## 2022-09-21 NOTE — H&P (Signed)
Aaron Darby, MD [address]  [place]  [place], [place] [address]  Main: [phone]  Fax: [phone] Pager: [phone]  Primary Care Physician:  [name], NP Primary Gastroenterologist:  Dr. Cephas [name]  Pre-Procedure History & Physical: HPI:  Aaron [name] is a 47 y.o. male is here for an colonoscopy.   Past Medical History:  Diagnosis Date   Asthma    Diabetes mellitus without complication ([place])    Hypertension     History reviewed. No pertinent surgical history.  Prior to Admission medications   Medication Sig Start Date End Date Taking? Authorizing Provider  bictegravir-emtricitabine-tenofovir AF (BIKTARVY) 50-200-25 MG TABS tablet Take 1 tablet by mouth daily. 08/09/22  Yes Vu, Rockey Situ, MD  lisinopril-hydrochlorothiazide (ZESTORETIC) 20-25 MG tablet Take by mouth. 08/17/22  Yes [provider]  albuterol (VENTOLIN HFA) 108 (90 Base) MCG/ACT inhaler Inhale 2 puffs into the lungs every 6 (six) hours as needed for wheezing or shortness of breath. 08/30/22   Fisher, Linden Dolin, PA-C  colchicine 0.6 MG tablet Take 1 tablet (0.6 mg total) by mouth 2 (two) times daily. 12/01/20 12/01/21  Fisher, Linden Dolin, PA-C  ipratropium-albuterol (DUONEB) 0.5-2.5 (3) MG/3ML SOLN Take 3 mLs by nebulization every 4 (four) hours as needed. 08/30/22   Fisher, Linden Dolin, PA-C  predniSONE (STERAPRED UNI-PAK 21 TAB) 10 MG (21) TBPK tablet Take 6 pills on day one then decrease by 1 pill each day 08/30/22   Versie Starks, PA-C    Allergies as of 08/18/2022 - Review Complete 08/18/2022  Allergen Reaction Noted   Levaquin [levofloxacin] Itching and Rash 11/14/2015    History reviewed. No pertinent family history.  Social History   Socioeconomic History   Marital status: Single    Spouse name: Not on file   Number of children: Not on file   Years of education: Not on file   Highest education level: Not on file  Occupational History   Not on file  Tobacco Use    Smoking status: Every Day    Types: Cigarettes   Smokeless tobacco: Never  Substance and Sexual Activity   Alcohol use: Yes   Drug use: Yes    Types: Marijuana   Sexual activity: Not Currently    Partners: Male    Birth control/protection: Condom    Comment: declined condoms  Other Topics Concern   Not on file  Social History Narrative   Not on file   Social Determinants of Health   Financial Resource Strain: Not on file  Food Insecurity: Not on file  Transportation Needs: Not on file  Physical Activity: Not on file  Stress: Not on file  Social Connections: Not on file  Intimate Partner Violence: Not on file    Review of Systems: See HPI, otherwise negative ROS  Physical Exam: BP (!) 152/104   Pulse 92   Temp (!) 97.2 F (36.2 C) (Temporal)   Resp 20   Ht 6' (1.829 m)   Wt 130.2 kg   SpO2 100%   BMI 38.92 kg/m  General:   Alert,  pleasant and cooperative in NAD Head:  Normocephalic and atraumatic. Neck:  Supple; no masses or thyromegaly. Lungs:  Clear throughout to auscultation.    Heart:  Regular rate and rhythm. Abdomen:  Soft, nontender and nondistended. Normal bowel sounds, without guarding, and without rebound.   Neurologic:  Alert and  oriented x4;  grossly normal neurologically.  Impression/Plan: Aaron [name] is here for  an colonoscopy to be performed for colon cancer screening  Risks, benefits, limitations, and alternatives regarding  colonoscopy have been reviewed with the patient.  Questions have been answered.  All parties agreeable.   Sherri Sear, MD  09/21/2022, 8:42 AM

## 2022-09-22 ENCOUNTER — Encounter: Payer: Self-pay | Admitting: Gastroenterology

## 2022-09-23 ENCOUNTER — Observation Stay: Payer: Medicaid Other

## 2022-09-23 ENCOUNTER — Emergency Department: Payer: Medicaid Other

## 2022-09-23 ENCOUNTER — Other Ambulatory Visit: Payer: Self-pay

## 2022-09-23 ENCOUNTER — Inpatient Hospital Stay
Admission: EM | Admit: 2022-09-23 | Discharge: 2022-09-25 | DRG: 684 | Disposition: A | Discharge status: Home / Self Care | Payer: Medicaid Other | Attending: Family Medicine | Admitting: Family Medicine

## 2022-09-23 ENCOUNTER — Encounter: Payer: Self-pay | Admitting: Emergency Medicine

## 2022-09-23 DIAGNOSIS — M79605 Pain in left leg: Secondary | ICD-10-CM | POA: Diagnosis not present

## 2022-09-23 DIAGNOSIS — M25462 Effusion, left knee: Secondary | ICD-10-CM | POA: Diagnosis present

## 2022-09-23 DIAGNOSIS — Z79899 Other long term (current) drug therapy: Secondary | ICD-10-CM

## 2022-09-23 DIAGNOSIS — M109 Gout, unspecified: Secondary | ICD-10-CM | POA: Diagnosis present

## 2022-09-23 DIAGNOSIS — E876 Hypokalemia: Secondary | ICD-10-CM | POA: Diagnosis not present

## 2022-09-23 DIAGNOSIS — M79662 Pain in left lower leg: Secondary | ICD-10-CM | POA: Diagnosis not present

## 2022-09-23 DIAGNOSIS — E119 Type 2 diabetes mellitus without complications: Secondary | ICD-10-CM | POA: Diagnosis not present

## 2022-09-23 DIAGNOSIS — J45909 Unspecified asthma, uncomplicated: Secondary | ICD-10-CM | POA: Diagnosis present

## 2022-09-23 DIAGNOSIS — Z21 Asymptomatic human immunodeficiency virus [HIV] infection status: Secondary | ICD-10-CM | POA: Diagnosis present

## 2022-09-23 DIAGNOSIS — M7989 Other specified soft tissue disorders: Secondary | ICD-10-CM | POA: Diagnosis not present

## 2022-09-23 DIAGNOSIS — N179 Acute kidney failure, unspecified: Secondary | ICD-10-CM | POA: Diagnosis not present

## 2022-09-23 DIAGNOSIS — E669 Obesity, unspecified: Secondary | ICD-10-CM | POA: Diagnosis present

## 2022-09-23 DIAGNOSIS — B20 Human immunodeficiency virus [HIV] disease: Secondary | ICD-10-CM | POA: Diagnosis present

## 2022-09-23 DIAGNOSIS — M25562 Pain in left knee: Secondary | ICD-10-CM | POA: Diagnosis present

## 2022-09-23 DIAGNOSIS — E118 Type 2 diabetes mellitus with unspecified complications: Secondary | ICD-10-CM | POA: Diagnosis present

## 2022-09-23 DIAGNOSIS — F17210 Nicotine dependence, cigarettes, uncomplicated: Secondary | ICD-10-CM | POA: Diagnosis present

## 2022-09-23 DIAGNOSIS — Z6836 Body mass index (BMI) 36.0-36.9, adult: Secondary | ICD-10-CM

## 2022-09-23 DIAGNOSIS — M064 Inflammatory polyarthropathy: Secondary | ICD-10-CM | POA: Diagnosis not present

## 2022-09-23 DIAGNOSIS — M25475 Effusion, left foot: Secondary | ICD-10-CM | POA: Diagnosis not present

## 2022-09-23 DIAGNOSIS — Z66 Do not resuscitate: Secondary | ICD-10-CM | POA: Diagnosis present

## 2022-09-23 DIAGNOSIS — Z881 Allergy status to other antibiotic agents status: Secondary | ICD-10-CM

## 2022-09-23 DIAGNOSIS — M19072 Primary osteoarthritis, left ankle and foot: Secondary | ICD-10-CM | POA: Diagnosis not present

## 2022-09-23 DIAGNOSIS — I10 Essential (primary) hypertension: Secondary | ICD-10-CM | POA: Diagnosis not present

## 2022-09-23 DIAGNOSIS — R600 Localized edema: Secondary | ICD-10-CM | POA: Diagnosis not present

## 2022-09-23 LAB — CBC WITH DIFFERENTIAL/PLATELET
Abs Immature Granulocytes: 0.06 10*3/uL (ref 0.00–0.07)
Basophils Absolute: 0.1 10*3/uL (ref 0.0–0.1)
Basophils Relative: 1 %
Eosinophils Absolute: 0 10*3/uL (ref 0.0–0.5)
Eosinophils Relative: 0 %
HCT: 36.4 % — ABNORMAL LOW (ref 39.0–52.0)
Hemoglobin: 12.2 g/dL — ABNORMAL LOW (ref 13.0–17.0)
Immature Granulocytes: 1 %
Lymphocytes Relative: 17 %
Lymphs Abs: 1.9 10*3/uL (ref 0.7–4.0)
MCH: 29.3 pg (ref 26.0–34.0)
MCHC: 33.5 g/dL (ref 30.0–36.0)
MCV: 87.3 fL (ref 80.0–100.0)
Monocytes Absolute: 2.3 10*3/uL — ABNORMAL HIGH (ref 0.1–1.0)
Monocytes Relative: 20 %
Neutro Abs: 7 10*3/uL (ref 1.7–7.7)
Neutrophils Relative %: 61 %
Platelets: 316 10*3/uL (ref 150–400)
RBC: 4.17 MIL/uL — ABNORMAL LOW (ref 4.22–5.81)
RDW: 14.8 % (ref 11.5–15.5)
WBC: 11.3 10*3/uL — ABNORMAL HIGH (ref 4.0–10.5)
nRBC: 0 % (ref 0.0–0.2)

## 2022-09-23 LAB — COMPREHENSIVE METABOLIC PANEL
ALT: 27 U/L (ref 0–44)
AST: 20 U/L (ref 15–41)
Albumin: 3.7 g/dL (ref 3.5–5.0)
Alkaline Phosphatase: 34 U/L — ABNORMAL LOW (ref 38–126)
Anion gap: 7 (ref 5–15)
BUN: 12 mg/dL (ref 6–20)
CO2: 27 mmol/L (ref 22–32)
Calcium: 8.3 mg/dL — ABNORMAL LOW (ref 8.9–10.3)
Chloride: 99 mmol/L (ref 98–111)
Creatinine, Ser: 1.4 mg/dL — ABNORMAL HIGH (ref 0.61–1.24)
GFR, Estimated: >60 mL/min (ref >60)
Glucose, Bld: 121 mg/dL — ABNORMAL HIGH (ref 70–99)
Potassium: 3 mmol/L — ABNORMAL LOW (ref 3.5–5.1)
Sodium: 133 mmol/L — ABNORMAL LOW (ref 135–145)
Total Bilirubin: 0.9 mg/dL (ref 0.3–1.2)
Total Protein: 8.1 g/dL (ref 6.5–8.1)

## 2022-09-23 LAB — TSH: TSH: 0.302 u[IU]/mL — ABNORMAL LOW (ref 0.350–4.500)

## 2022-09-23 LAB — GLUCOSE, CAPILLARY
Glucose-Capillary: 168 mg/dL — ABNORMAL HIGH (ref 70–99)
Glucose-Capillary: 216 mg/dL — ABNORMAL HIGH (ref 70–99)
Glucose-Capillary: 207 mg/dL — ABNORMAL HIGH (ref 70–99)

## 2022-09-23 LAB — SYNOVIAL CELL COUNT + DIFF, W/ CRYSTALS
Crystals, Fluid: NONE SEEN
Eosinophils-Synovial: 0 %
Lymphocytes-Synovial Fld: 5 %
Monocyte-Macrophage-Synovial Fluid: 3 %
Neutrophil, Synovial: 92 %
WBC, Synovial: 21934 /mm3 — ABNORMAL HIGH (ref 0–200)

## 2022-09-23 LAB — HEMOGLOBIN A1C
Hgb A1c MFr Bld: 6.5 % — ABNORMAL HIGH (ref 4.8–5.6)
Mean Plasma Glucose: 139.85 mg/dL

## 2022-09-23 LAB — SEDIMENTATION RATE: Sed Rate: 72 mm/hr — ABNORMAL HIGH (ref 0–15)

## 2022-09-23 LAB — URIC ACID: Uric Acid, Serum: 9.5 mg/dL — ABNORMAL HIGH (ref 3.7–8.6)

## 2022-09-23 LAB — BRAIN NATRIURETIC PEPTIDE: B Natriuretic Peptide: 16.4 pg/mL (ref 0.0–100.0)

## 2022-09-23 LAB — CK: Total CK: 348 U/L (ref 49–397)

## 2022-09-23 MED ORDER — INSULIN ASPART 100 UNIT/ML IJ SOLN: 0.0000 [IU] | Freq: Three times a day (TID) | INTRAMUSCULAR | Status: DC

## 2022-09-23 MED ORDER — BICTEGRAVIR-EMTRICITAB-TENOFOV 50-200-25 MG PO TABS
1.0000 | ORAL_TABLET | Freq: Every day | ORAL | Status: DC
  Administered 2022-09-24 – 2022-09-25 (×2): 1 via ORAL
  Filled 2022-09-23 (×2): qty 1

## 2022-09-23 MED ORDER — SODIUM CHLORIDE 0.9 % IV SOLN
1.0000 g | Freq: Once | INTRAVENOUS | Status: AC
  Administered 2022-09-23: 1 g via INTRAVENOUS
  Filled 2022-09-23: qty 10

## 2022-09-23 MED ORDER — ENOXAPARIN SODIUM 60 MG/0.6ML IJ SOSY
60.0000 mg | PREFILLED_SYRINGE | INTRAMUSCULAR | Status: DC
  Administered 2022-09-23 – 2022-09-24 (×2): 60 mg via SUBCUTANEOUS
  Filled 2022-09-23 (×2): qty 0.6

## 2022-09-23 MED ORDER — MORPHINE SULFATE (PF) 4 MG/ML IV SOLN
4.0000 mg | Freq: Once | INTRAVENOUS | Status: AC
  Administered 2022-09-23: 4 mg via INTRAVENOUS
  Filled 2022-09-23: qty 1

## 2022-09-23 MED ORDER — INSULIN ASPART 100 UNIT/ML IJ SOLN
0.0000 [IU] | Freq: Three times a day (TID) | INTRAMUSCULAR | Status: DC
  Administered 2022-09-23 – 2022-09-24 (×2): 2 [IU] via SUBCUTANEOUS
  Administered 2022-09-24 – 2022-09-25 (×2): 3 [IU] via SUBCUTANEOUS
  Filled 2022-09-23 (×4): qty 1

## 2022-09-23 MED ORDER — ONDANSETRON HCL 4 MG PO TABS: 4.0000 mg | ORAL_TABLET | Freq: Four times a day (QID) | ORAL | Status: DC | PRN

## 2022-09-23 MED ORDER — ACETAMINOPHEN 650 MG RE SUPP: 650.0000 mg | Freq: Four times a day (QID) | RECTAL | Status: DC | PRN

## 2022-09-23 MED ORDER — GADOBUTROL 1 MMOL/ML IV SOLN
10.0000 mL | Freq: Once | INTRAVENOUS | Status: AC | PRN
  Administered 2022-09-23: 10 mL via INTRAVENOUS

## 2022-09-23 MED ORDER — OXYCODONE-ACETAMINOPHEN 5-325 MG PO TABS
1.0000 | ORAL_TABLET | Freq: Once | ORAL | Status: AC
  Administered 2022-09-23: 1 via ORAL
  Filled 2022-09-23: qty 1

## 2022-09-23 MED ORDER — POTASSIUM CHLORIDE CRYS ER 20 MEQ PO TBCR
40.0000 meq | EXTENDED_RELEASE_TABLET | Freq: Once | ORAL | Status: AC
  Administered 2022-09-23: 40 meq via ORAL
  Filled 2022-09-23: qty 2

## 2022-09-23 MED ORDER — SODIUM CHLORIDE 0.9 % IV BOLUS
1000.0000 mL | Freq: Once | INTRAVENOUS | Status: AC
  Administered 2022-09-23: 1000 mL via INTRAVENOUS

## 2022-09-23 MED ORDER — HYDROCODONE-ACETAMINOPHEN 5-325 MG PO TABS
1.0000 | ORAL_TABLET | Freq: Four times a day (QID) | ORAL | Status: DC | PRN
  Administered 2022-09-23: 2 via ORAL
  Filled 2022-09-23: qty 2

## 2022-09-23 MED ORDER — POLYETHYLENE GLYCOL 3350 17 G PO PACK: 17.0000 g | PACK | Freq: Every day | ORAL | Status: DC | PRN

## 2022-09-23 MED ORDER — ONDANSETRON HCL 4 MG/2ML IJ SOLN: 4.0000 mg | Freq: Four times a day (QID) | INTRAMUSCULAR | Status: DC | PRN

## 2022-09-23 MED ORDER — ENOXAPARIN SODIUM 60 MG/0.6ML IJ SOSY: 0.5000 mg/kg | PREFILLED_SYRINGE | INTRAMUSCULAR | Status: DC

## 2022-09-23 MED ORDER — PREDNISONE 20 MG PO TABS
60.0000 mg | ORAL_TABLET | Freq: Once | ORAL | Status: AC
  Administered 2022-09-23: 60 mg via ORAL
  Filled 2022-09-23: qty 3

## 2022-09-23 MED ORDER — ACETAMINOPHEN 325 MG PO TABS: 650.0000 mg | ORAL_TABLET | Freq: Four times a day (QID) | ORAL | Status: DC | PRN

## 2022-09-23 MED ORDER — ENOXAPARIN SODIUM 40 MG/0.4ML IJ SOSY: 40.0000 mg | PREFILLED_SYRINGE | INTRAMUSCULAR | Status: DC

## 2022-09-23 MED ORDER — ALBUTEROL SULFATE (2.5 MG/3ML) 0.083% IN NEBU: 2.5000 mg | INHALATION_SOLUTION | Freq: Four times a day (QID) | RESPIRATORY_TRACT | Status: DC | PRN

## 2022-09-23 MED ORDER — HYDROMORPHONE HCL 1 MG/ML IJ SOLN: 1.0000 mg | INTRAMUSCULAR | Status: DC | PRN

## 2022-09-23 NOTE — ED Provider Notes (Signed)
[facility] Provider Note    Event Date/Time   First MD Initiated Contact with Patient 09/23/22 0715     (approximate)   History   Chief Complaint Knee Pain   HPI Aaron [name] is a 47 y.o. male, history of hypertension, asthma, type 2 diabetes, obesity, presents to the emergency department via EMS for evaluation of left-sided leg/knee pain.  Patient states that 2 days ago, he began to notice swelling in his left foot, that has progressed to his left thigh.  Reports generalized pain everywhere along his left lower extremity.  He states that he was walking normally in 2 days prior, however now is barely able to ambulate.  Denies any recent falls or injuries.  Denies fever/chills, chest pain, shortness of breath, abdominal pain, nausea vomiting, diarrhea, paresthesias, cold sensation in the affected extremity, rashes, or dizziness/lightheadedness.  History Limitations: No limitations.        Physical Exam  Triage Vital Signs: ED Triage Vitals  Enc Vitals Group     BP      Pulse      Resp      Temp      Temp src      SpO2      Weight      Height      Head Circumference      Peak Flow      Pain Score      Pain Loc      Pain Edu?      Excl. in [place]?     Most recent vital signs: Vitals:   09/23/22 0719  BP: (!) 111/91  Pulse: 98  Resp: 20  Temp: 98.7 F (37.1 C)  SpO2: 100%    General: Awake, NAD.  Skin: Warm, dry. No rashes or lesions.  Eyes: PERRL. Conjunctivae normal.  CV: Good peripheral perfusion.  Resp: Normal effort.  Abd: Soft, non-tender. No distention.  Neuro: At baseline. No gross neurological deficits.  Musculoskeletal: Normal ROM of all extremities.  Focused Exam: The left lower extremity does appear generally more swollen than the right lower extremity.  1+ pitting edema along the foot/calf region.  Generalized tenderness with palpation across the entire left lower extremity.  His left foot does appear slightly  erythematous, though difficult to appreciate given patient's darker skin complexion.  Normal pulses distally.  Normal cap refill.  Motor function and sensation intact distally.  Physical Exam    ED Results / Procedures / Treatments  Labs (all labs ordered are listed, but only abnormal results are displayed) Labs Reviewed  CBC WITH DIFFERENTIAL/PLATELET - Abnormal; Notable for the following components:      Result Value   WBC 11.3 (*)    RBC 4.17 (*)    Hemoglobin 12.2 (*)    HCT 36.4 (*)    Monocytes Absolute 2.3 (*)    All other components within normal limits  COMPREHENSIVE METABOLIC PANEL - Abnormal; Notable for the following components:   Sodium 133 (*)    Potassium 3.0 (*)    Glucose, Bld 121 (*)    Creatinine, Ser 1.40 (*)    Calcium 8.3 (*)    Alkaline Phosphatase 34 (*)    All other components within normal limits  URIC ACID - Abnormal; Notable for the following components:   Uric Acid, Serum 9.5 (*)    All other components within normal limits  SEDIMENTATION RATE - Abnormal; Notable for the following components:   Sed Rate 72 (*)  All other components within normal limits  CULTURE, BLOOD (ROUTINE X 2)  CULTURE, BLOOD (ROUTINE X 2)  BRAIN NATRIURETIC PEPTIDE  CK  TSH  HEMOGLOBIN A1C     EKG Sinus rhythm, rate of 98, no significant ST segment changes, right axis deviation present, incomplete right bundle branch block, normal QRS, no QT prolongation.  RADIOLOGY  ED Provider Interpretation: I personally viewed and interpreted this ultrasound, no evidence of DVT.  DG Tibia/Fibula Left Port  Result Date: 09/23/2022 CLINICAL DATA:  Foot pain and swelling EXAM: PORTABLE LEFT TIBIA AND FIBULA - 2 VIEW COMPARISON:  None Available. FINDINGS: No fracture or dislocation. Preserved joint spaces and bone mineralization. No definite erosive change IMPRESSION: No acute osseous abnormality Electronically Signed   By: Jill Side M.D.   On: 09/23/2022 14:05   DG Foot  Complete Left  Result Date: 09/23/2022 CLINICAL DATA:  Diabetic with swelling of left foot. EXAM: LEFT FOOT - COMPLETE 3+ VIEW COMPARISON:  None Available. FINDINGS: There is diffuse soft tissue edema about foot. Lateral subluxation of the fifth DIP joint is identified. No sign acute fracture or dislocation. No focal bone erosions. Formal IMPRESSION: 1. Diffuse soft tissue edema. 2. No signs of acute fracture or dislocation. No focal bone erosions identified to suggest acute osteomyelitis. 3. Lateral subluxation of the fifth DIP joint, which may be a chronic finding. Electronically Signed   By: Kerby Moors M.D.   On: 09/23/2022 14:05   DG Knee 1-2 Views Left  Result Date: 09/23/2022 CLINICAL DATA:  Diabetes.  Swelling. EXAM: LEFT KNEE - 1-2 VIEW COMPARISON:  None. FINDINGS: Limited two views study shows no evidence for an acute fracture or dislocation at the knee. Probable joint effusion although assessment limited by positioning. No worrisome lytic or sclerotic osseous abnormality. IMPRESSION: Negative. Electronically Signed   By: Misty Stanley M.D.   On: 09/23/2022 14:04   US Venous Img Lower Unilateral Left (DVT)  Result Date: 09/23/2022 CLINICAL DATA:  Pain and swelling of left lower leg EXAM: LEFT LOWER EXTREMITY VENOUS DOPPLER ULTRASOUND TECHNIQUE: Gray-scale sonography with compression, as well as color and duplex ultrasound, were performed to evaluate the deep venous system(s) from the level of the common femoral vein through the popliteal and proximal calf veins. COMPARISON:  None Available. FINDINGS: VENOUS Normal compressibility of the common femoral, superficial femoral, and popliteal veins, as well as the visualized calf veins. Visualized portions of profunda femoral vein and great saphenous vein unremarkable. No filling defects to suggest DVT on grayscale or color Doppler imaging. Doppler waveforms show normal direction of venous flow, normal respiratory plasticity and response to  augmentation. Limited views of the contralateral common femoral vein are unremarkable. Small 1.5 cm fluid collection within the left popliteal fossa, likely a Baker's cyst. IMPRESSION: No evidence of DVT in the left lower extremity. Electronically Signed   By: Margaretha Sheffield M.D.   On: 09/23/2022 11:06    PROCEDURES:  Critical Care performed: N/A.  Procedures    MEDICATIONS ORDERED IN ED: Medications  HYDROcodone-acetaminophen (NORCO/VICODIN) 5-325 MG per tablet 1-2 tablet (has no administration in time range)  HYDROmorphone (DILAUDID) injection 1 mg (has no administration in time range)  acetaminophen (TYLENOL) tablet 650 mg (has no administration in time range)    Or  acetaminophen (TYLENOL) suppository 650 mg (has no administration in time range)  polyethylene glycol (MIRALAX / GLYCOLAX) packet 17 g (has no administration in time range)  ondansetron (ZOFRAN) tablet 4 mg (has no administration in time range)  Or  ondansetron (ZOFRAN) injection 4 mg (has no administration in time range)  insulin aspart (novoLOG) injection 0-20 Units (has no administration in time range)  enoxaparin (LOVENOX) injection 62.5 mg (has no administration in time range)  potassium chloride SA (KLOR-CON M) CR tablet 40 mEq (has no administration in time range)  oxyCODONE-acetaminophen (PERCOCET/ROXICET) 5-325 MG per tablet 1 tablet (1 tablet Oral Given 09/23/22 0735)  potassium chloride SA (KLOR-CON M) CR tablet 40 mEq (40 mEq Oral Given 09/23/22 1133)  sodium chloride 0.9 % bolus 1,000 mL (0 mLs Intravenous Stopped 09/23/22 1411)  cefTRIAXone (ROCEPHIN) 1 g in sodium chloride 0.9 % 100 mL IVPB (0 g Intravenous Stopped 09/23/22 1411)  predniSONE (DELTASONE) tablet 60 mg (60 mg Oral Given 09/23/22 1232)  morphine (PF) 4 MG/ML injection 4 mg (4 mg Intravenous Given 09/23/22 1234)     IMPRESSION / MDM / ASSESSMENT AND PLAN / ED COURSE  I reviewed the triage vital signs and the nursing notes.                               Differential diagnosis includes, but is not limited to, heart failure, renal failure, liver failure, DVT, cellulitis, venous insufficiency, Baker's cyst, musculoskeletal strain   ED Course Patient appears clinically stable.  Vitals within normal limits.  CBC shows leukocytosis at 11.3.  Mild anemia present with hemoglobin of 12.2.  CMP shows hypokalemia 3.0.  Will provide potassium supplementation.  No other electrolyte abnormalities.  Creatinine elevated 1.4.  No transaminitis.  BMP unremarkable at 16.4.  Sedimentation rate elevated at 72.  Uric acid elevated 9.5.  Assessment/Plan Patient presents with pain in the left lower extremity x 2 days.  No falls or injuries.  On exam, patient does have erythema and swelling of the foot with slight extension towards the leg.  Patient endorses most of his pain in the foot and knee.  Possible gout flare, especially given elevated uric acid and sedimentation rate, however the tenderness with light palpation of the skin to the lower leg and history of diabetes is concerning for possible cellulitis or other musculoskeletal pathology.  Very low suspicion for septic joint given presentation.  However, patient is adamant that he cannot ambulate on his own due to the pain, despite analgesics.  Will initiate antibiotics, prednisone, and further analgesics at this time.  Will plan to admit to medicine for further evaluation and management.  Spoke with the on-call hospitalist, Dr. Margaretmary Bayley, who accepted admission.  Patient's presentation is most consistent with acute presentation with potential threat to life or bodily function.       FINAL CLINICAL IMPRESSION(S) / ED DIAGNOSES   Final diagnoses:  AKI (acute kidney injury) ([place])  Pain of left lower extremity     Rx / DC Orders   ED Discharge Orders     None        Note:  This document was prepared using Dragon voice recognition software and may include unintentional dictation errors.    Teodoro Spray, [place] 09/23/22 1504    Nathaniel Man, MD 09/24/22 [phone]

## 2022-09-23 NOTE — Progress Notes (Signed)
PIV consult: No IV medications ordered at this time. Communicated to [place], RN to have phlebotomy draw labs. For vessel preservation, VAST will await IV medication orders to place the best line for pt.

## 2022-09-23 NOTE — Assessment & Plan Note (Signed)
Last viral load negative.  -Continue Biktarvy

## 2022-09-23 NOTE — ED Triage Notes (Signed)
Presents with pain to left knee/leg States pain started in toes and moved up leg  Swelling noted to entire leg  Unable to  stand or bear wt  Denies any injury   HX of gout

## 2022-09-23 NOTE — Assessment & Plan Note (Addendum)
Patient is presenting with 2-day history of left foot pain and swelling that has now progressed up to the superior aspect of his knee.  The entire area affected area is swollen with significant tenderness to palpation, however there is no evidence of skin breakdown, erythema, rash or ulceration.  Patient is afebrile and he denies any recent symptoms of illness.  Differential at this point is broad but includes both infectious and noninfectious etiology including myositis, cellulitis.  Differential also includes polyarticular gout.  - Given history of HIV, will consult infectious disease; appreciate their recommendations - Per discussion with ID, orthopedic surgery was consulted with plans for possible arthrocentesis - Continue Rocephin 1 g daily - S/p prednisone 60 mg once - CK  - TSH - Blood cultures - Given symptoms initially began on the foot, will obtain an MRI with and without.  If negative, will consider MRI of the knee

## 2022-09-23 NOTE — Assessment & Plan Note (Signed)
-   S/p 40 mEq of potassium - Will reorder additional 40 mEq - Repeat BMP in the a.m.

## 2022-09-23 NOTE — H&P (Addendum)
History and Physical    Patient: Aaron [name] [id number] DOB: Dec 07, 1975 DOA: 09/23/2022 DOS: the patient was seen and examined on 09/23/2022 PCP: [name], NP  Patient coming from: Home  Chief Complaint:  Chief Complaint  Patient presents with   Knee Pain   HPI: [name] is a 47 y.o. male with medical history significant of type 2 diabetes, HIV on Biktarvy, hypertension, asthma, gout, who presents to the ED due to left leg pain.  Mr. Lanzo states that approximately 2 days ago, he had sudden onset right foot swelling and pain.  The affected area was both on the bottom of his foot and the top.  Gradually, the pain and swelling began to progressed to involve his ankle, then his calf, and is now spread to the top of his knee.  Due to this pain, he has been unable to bend his knee or bear any weight.  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  He denies any recent falls.  He denies any recent erythema, rash, ulcer.  He denies any prior history of similar.  He notes that he has a history of gout, but this does not feel similar.  ED course: On arrival to the ED, patient was normotensive at 111/91 with heart rate of 98.  He was saturating at 100% on room air.  He was afebrile at 98.7. Initial workup remarkable for WBC of 11.3, hemoglobin of 12.2, potassium of 3.0, glucose of 121, creatinine of 1.40 and GFR above 60.  Uric acid elevated at 9.5.  ESR elevated at 72.  Left Doppler study negative for DVT.  Due to uncertain etiology, patient was given prednisone and ceftriaxone.  TRH contacted for admission.  Review of Systems: As mentioned in the history of present illness. All other systems reviewed and are negative.  Past Medical History:  Diagnosis Date   Asthma    Diabetes mellitus without complication ([place])    Hypertension    Past Surgical History:  Procedure Laterality Date    COLONOSCOPY WITH PROPOFOL N/A 09/21/2022   Procedure: COLONOSCOPY WITH PROPOFOL;  Surgeon: Lin Landsman, MD;  Location: [facility] ENDOSCOPY;  Service: Gastroenterology;  Laterality: N/A;   Social History:  reports that he has been smoking cigarettes. He has never used smokeless tobacco. He reports current alcohol use. He reports current drug use. Drug: Marijuana.  Allergies  Allergen Reactions   Levaquin [Levofloxacin] Itching and Rash    History reviewed. No pertinent family history.  Prior to Admission medications   Medication Sig Start Date End Date Taking? Authorizing Provider  albuterol (VENTOLIN HFA) 108 (90 Base) MCG/ACT inhaler Inhale 2 puffs into the lungs every 6 (six) hours as needed for wheezing or shortness of breath. 08/30/22   Fisher, Linden Dolin, PA-C  bictegravir-emtricitabine-tenofovir AF (BIKTARVY) 50-200-25 MG TABS tablet Take 1 tablet by mouth daily. 08/09/22   Vu, Johnny Bridge T, MD  colchicine 0.6 MG tablet Take 1 tablet (0.6 mg total) by mouth 2 (two) times daily. 12/01/20 12/01/21  Fisher, Linden Dolin, PA-C  ipratropium-albuterol (DUONEB) 0.5-2.5 (3) MG/3ML SOLN Take 3 mLs by nebulization every 4 (four) hours as needed. 08/30/22   Fisher, Linden Dolin, PA-C  lisinopril-hydrochlorothiazide (ZESTORETIC) 20-25 MG tablet Take by mouth. 08/17/22   [provider]  predniSONE (STERAPRED UNI-PAK 21 TAB) 10 MG (21) TBPK tablet Take 6 pills on day one then decrease by 1  pill each day 08/30/22   Versie Starks, PA-C    Physical Exam: Vitals:   09/23/22 0719 09/23/22 0721 09/23/22 1445 09/23/22 1537  BP: (!) 111/91  (!) 141/91 (!) 148/88  Pulse: 98  90 92  Resp: 20  18 16  $ Temp: 98.7 F (37.1 C)   98.4 F (36.9 C)  SpO2: 100%  95% 100%  Weight:  123.4 kg    Height:  6' (1.829 m)     Physical Exam Vitals and nursing note reviewed.  Constitutional:      General: He is not in acute distress.    Appearance: He is obese. He is not toxic-appearing.  HENT:     Head: Normocephalic and  atraumatic.  Eyes:     Conjunctiva/sclera: Conjunctivae normal.     Pupils: Pupils are equal, round, and reactive to light.  Cardiovascular:     Rate and Rhythm: Normal rate and regular rhythm.     Heart sounds: No murmur heard.    No gallop.  Pulmonary:     Effort: Pulmonary effort is normal. No respiratory distress.     Breath sounds: Normal breath sounds. No wheezing, rhonchi or rales.  Abdominal:     General: Bowel sounds are normal. There is no distension.     Palpations: Abdomen is soft.     Tenderness: There is no abdominal tenderness. There is no guarding.  Musculoskeletal:     Right knee: Normal.     Left knee: Swelling (Circumferential tenderness with likely underlying prepatellar effusion and, however difficult to palpating due to tenderness) present. Tenderness (Throughout) present.     Left lower leg: Tenderness (Throughout) present. No deformity. Edema (Nonpitting edema) present.     Right foot: Normal. Normal capillary refill.     Left foot: Normal capillary refill. Swelling and tenderness (Significant tenderness to palpation, particularly on the dorsum of the left foot and with any movement of toes.) present. No deformity or laceration.  Skin:    General: Skin is warm and dry.     Findings: No bruising, erythema, laceration, lesion, petechiae, rash or wound.  Neurological:     General: No focal deficit present.     Mental Status: He is alert and oriented to person, place, and time. Mental status is at baseline.  Psychiatric:        Mood and Affect: Mood normal.        Behavior: Behavior normal.    Data Reviewed: CBC with WBC of 11.3, hemoglobin 12.2, platelets of 360 CMP with sodium of 133, potassium 3.0, bicarb 27, glucose 121, creatinine 1.40, calcium 8.3, alkaline phosphatase 34, AST 20, ALT 27 and GFR above 60 Uric acid elevated at 9.5 BNP within normal limits at 16 ESR elevated at 72  EKG personally reviewed.  Sinus rhythm with rate of 95.  Borderline PR  prolongation.  MR FOOT LEFT W WO CONTRAST  Result Date: 09/23/2022 CLINICAL DATA:  Acute onset pain and swelling. EXAM: MRI OF THE LEFT FOREFOOT WITHOUT AND WITH CONTRAST TECHNIQUE: Multiplanar, multisequence MR imaging of the left foot was performed both before and after administration of intravenous contrast. CONTRAST:  45m GADAVIST GADOBUTROL 1 MMOL/ML IV SOLN COMPARISON:  Radiographs 09/23/2022 FINDINGS: Evidence of a significant inflammatory arthropathy involving the first MTP joint. There is a large joint effusion, enhancing synovitis and a large medial erosion involving the first metatarsal head away from the joint. There is some surrounding soft tissue swelling/edema. Findings are most typical for gouty arthritis. There are  also joint effusions involving the other MTP joints but no definite erosive findings. Degenerative changes involving the third tarsal metatarsal joint and also the cuboid joint articulating with the fourth and fifth metatarsals. Joint effusions, subchondral cystic change and marrow edema. Diffuse subcutaneous soft tissue swelling/edema/fluid most notably involving the dorsum of the foot but no soft tissue abscess. Mild myofasciitis without findings for pyomyositis. The major tendons and ligaments appear intact. IMPRESSION: 1. Evidence of a significant inflammatory arthropathy involving the first MTP joint. There is a large joint effusion, enhancing synovitis and a large medial erosion involving the first metatarsal head away from the joint. Septic arthritis cannot be totally excluded by imaging but these findings are most typical for gouty arthritis. 2. Arthropathic changes at the tarsal metatarsal joints also as detailed above. 3. Diffuse subcutaneous soft tissue swelling/edema/fluid most notably involving the dorsum of the foot but no soft tissue abscess. 4. Mild myofasciitis without findings for pyomyositis. Electronically Signed   By: Marijo Sanes M.D.   On: 09/23/2022 19:27    DG Tibia/Fibula Left Port  Result Date: 09/23/2022 CLINICAL DATA:  Foot pain and swelling EXAM: PORTABLE LEFT TIBIA AND FIBULA - 2 VIEW COMPARISON:  None Available. FINDINGS: No fracture or dislocation. Preserved joint spaces and bone mineralization. No definite erosive change IMPRESSION: No acute osseous abnormality Electronically Signed   By: Jill Side M.D.   On: 09/23/2022 14:05   DG Foot Complete Left  Result Date: 09/23/2022 CLINICAL DATA:  Diabetic with swelling of left foot. EXAM: LEFT FOOT - COMPLETE 3+ VIEW COMPARISON:  None Available. FINDINGS: There is diffuse soft tissue edema about foot. Lateral subluxation of the fifth DIP joint is identified. No sign acute fracture or dislocation. No focal bone erosions. Formal IMPRESSION: 1. Diffuse soft tissue edema. 2. No signs of acute fracture or dislocation. No focal bone erosions identified to suggest acute osteomyelitis. 3. Lateral subluxation of the fifth DIP joint, which may be a chronic finding. Electronically Signed   By: Kerby Moors M.D.   On: 09/23/2022 14:05   DG Knee 1-2 Views Left  Result Date: 09/23/2022 CLINICAL DATA:  Diabetes.  Swelling. EXAM: LEFT KNEE - 1-2 VIEW COMPARISON:  None. FINDINGS: Limited two views study shows no evidence for an acute fracture or dislocation at the knee. Probable joint effusion although assessment limited by positioning. No worrisome lytic or sclerotic osseous abnormality. IMPRESSION: Negative. Electronically Signed   By: Misty Stanley M.D.   On: 09/23/2022 14:04   US Venous Img Lower Unilateral Left (DVT)  Result Date: 09/23/2022 CLINICAL DATA:  Pain and swelling of left lower leg EXAM: LEFT LOWER EXTREMITY VENOUS DOPPLER ULTRASOUND TECHNIQUE: Gray-scale sonography with compression, as well as color and duplex ultrasound, were performed to evaluate the deep venous system(s) from the level of the common femoral vein through the popliteal and proximal calf veins. COMPARISON:  None Available.  FINDINGS: VENOUS Normal compressibility of the common femoral, superficial femoral, and popliteal veins, as well as the visualized calf veins. Visualized portions of profunda femoral vein and great saphenous vein unremarkable. No filling defects to suggest DVT on grayscale or color Doppler imaging. Doppler waveforms show normal direction of venous flow, normal respiratory plasticity and response to augmentation. Limited views of the contralateral common femoral vein are unremarkable. Small 1.5 cm fluid collection within the left popliteal fossa, likely a Baker's cyst. IMPRESSION: No evidence of DVT in the left lower extremity. Electronically Signed   By: Margaretha Sheffield M.D.   On: 09/23/2022  11:06    Results are pending, will review when available.  Assessment and Plan:  * Acute leg pain, left Patient is presenting with 2-day history of left foot pain and swelling that has now progressed up to the superior aspect of his knee.  The entire area affected area is swollen with significant tenderness to palpation, however there is no evidence of skin breakdown, erythema, rash or ulceration.  Patient is afebrile and he denies any recent symptoms of illness.  Differential at this point is broad but includes both infectious and noninfectious etiology including myositis, cellulitis.  Differential also includes polyarticular gout.  - Given history of HIV, will consult infectious disease; appreciate their recommendations - Per discussion with ID, orthopedic surgery was consulted with plans for possible arthrocentesis - Continue Rocephin 1 g daily - S/p prednisone 60 mg once - CK  - TSH - Blood cultures - Given symptoms initially began on the foot, will obtain an MRI with and without.  If negative, will consider MRI of the knee  AKI (acute kidney injury) ([place]) Creatinine is elevated at 1.4, however previously 1.7.  Patient has a history of fluctuating creatinine.  I wonder if there is an element of CKD given  history of hypertension.  - Will hold home lisinopril and HCTZ for now  HIV (human immunodeficiency virus infection) ([place]) Last viral load negative.  -Continue Biktarvy  Type 2 diabetes mellitus with complication, without long-term current use of insulin ([place]) Currently diet controlled.  Last A1c within goal at 6.5%  - SSI, sensitive  Essential hypertension - Continue home antihypertensives  Hypokalemia - S/p 40 mEq of potassium - Will reorder additional 40 mEq - Repeat BMP in the a.m.  Advance Care Planning:   Code Status: DNR.  Patient states that after living in a group home and seeing what happens after CPR, he would not want to be resuscitated in the setting of cardiac arrest.  He states that when it is his time to pass, he is ready to do so.  However, he would want full scope of care otherwise, including intubation.   Consults: Infectious disease  Family Communication: No family at bedside  Severity of Illness: The appropriate patient status for this patient is OBSERVATION. Observation status is judged to be reasonable and necessary in order to provide the required intensity of service to ensure the patient's safety. The patient's presenting symptoms, physical exam findings, and initial radiographic and laboratory data in the context of their medical condition is felt to place them at decreased risk for further clinical deterioration. Furthermore, it is anticipated that the patient will be medically stable for discharge from the hospital within 2 midnights of admission.   Author: Jose Persia, MD 09/23/2022 8:07 PM  For on call review [email or web address].

## 2022-09-23 NOTE — Progress Notes (Signed)
PIV consult: Arrived to ED, pt still in [place], per RN.

## 2022-09-23 NOTE — Progress Notes (Incomplete)
S:  Current diet NPO for arthrocentesis. Requesting food  B:  47 y.o. male with a history of diabetes, hypertension, gout, and asthma who normally lives independently in a group home.  Apparently, the patient was in his usual state of health 2 days ago when he began to notice increased pain and swelling in his left foot.  Over the past 48 hours, this pain and swelling have extended up his lower leg into his knee and distal thigh.  He became barely able to ambulate due to the severe pain and so presented to the emergency room.   A:  2030 ortho:  Left knee is aspirated sterilely of 130 cc of yellowish/straw-colored turbid fluid. The patient tolerated the procedure well. Samples of this fluid were sent for cell count differential, crystals, culture and sensitivity, gonorrhea culture, and Gram stain.   R: Can diet return to previous order carb modified? Thanks

## 2022-09-23 NOTE — Consult Note (Signed)
NAME: Aaron [name]  DOB: 03-27-1976  MRN: [id number]  Date/Time: 09/23/2022 4:37 PM  REQUESTING PROVIDER: Cheral Almas Subjective:  REASON FOR CONSULT: left leg swelling and pain ? Aaron [name] is a 47 y.o. with a history of HIV, HTN, DM, Asthma presents with acute onset of pain and swelling left  foot and leg- started 2 days ago. He works in a [place] it started as pain over the top of hos left foot , over the great toe and then spread up to involve the knee and thigh- the knee is very swollen- He is unable to move the leg and has to literally lift it up. He cannot walk on that leg No fever or chills No trauma No rash Sexually active- no new partners- no known exposure to STDs H/o gout but never had pain and swelling like this before [facility] for Hiv and is undetectable with good cd4 In the ED vitals  09/21/22  BP 158/90 !  Temp 96 F (35.6 C) !  Pulse Rate 80  Resp 20     Latest Reference Range & Units 09/23/22  WBC 4.0 - 10.5 K/uL 11.3 (H)  Hemoglobin 13.0 - 17.0 g/dL 12.2 (L)  HCT 39.0 - 52.0 % 36.4 (L)  Platelets 150 - 400 K/uL 316  Creatinine 0.61 - 1.24 mg/dL 1.40 (H)     Past Medical History:  Diagnosis Date   Asthma    Diabetes mellitus without complication ([place])    Hypertension     Past Surgical History:  Procedure Laterality Date   COLONOSCOPY WITH PROPOFOL N/A 09/21/2022   Procedure: COLONOSCOPY WITH PROPOFOL;  Surgeon: Lin Landsman, MD;  Location: [facility] ENDOSCOPY;  Service: Gastroenterology;  Laterality: N/A;    Social History   Socioeconomic History   Marital status: Single    Spouse name: Not on file   Number of children: Not on file   Years of education: Not on file   Highest education level: Not on file  Occupational History   Not on file  Tobacco Use   Smoking status: Every Day    Types: Cigarettes   Smokeless tobacco: Never  Substance and Sexual Activity   Alcohol use: Yes   Drug use: Yes    Types: Marijuana    Sexual activity: Not Currently    Partners: Male    Birth control/protection: Condom    Comment: declined condoms  Other Topics Concern   Not on file  Social History Narrative   Not on file   Social Determinants of Health   Financial Resource Strain: Not on file  Food Insecurity: No Food Insecurity (09/23/2022)   Hunger Vital Sign    Worried About Running Out of Food in the Last Year: Never true    Ran Out of Food in the Last Year: Never true  Transportation Needs: No Transportation Needs (09/23/2022)   PRAPARE - Hydrologist (Medical): No    Lack of Transportation (Non-Medical): No  Physical Activity: Not on file  Stress: Not on file  Social Connections: Not on file  Intimate Partner Violence: Not At Risk (09/23/2022)   Humiliation, Afraid, Rape, and Kick questionnaire    Fear of Current or Ex-Partner: No    Emotionally Abused: No    Physically Abused: No    Sexually Abused: No    History reviewed. No pertinent family history. Allergies  Allergen Reactions   Levaquin [Levofloxacin] Itching and Rash   I? Current Facility-Administered Medications  Medication Dose Route Frequency Provider Last Rate Last Admin   acetaminophen (TYLENOL) tablet 650 mg  650 mg Oral Q6H PRN Jose Persia, MD       Or   acetaminophen (TYLENOL) suppository 650 mg  650 mg Rectal Q6H PRN Jose Persia, MD       albuterol (PROVENTIL) (2.5 MG/3ML) 0.083% nebulizer solution 2.5 mg  2.5 mg Inhalation Q6H PRN Jose Persia, MD       [START ON 09/24/2022] bictegravir-emtricitabine-tenofovir AF (BIKTARVY) 50-200-25 MG per tablet 1 tablet  1 tablet Oral Daily Jose Persia, MD       enoxaparin (LOVENOX) injection 60 mg  60 mg Subcutaneous Q24H Dorothe Pea, RPH       HYDROcodone-acetaminophen (NORCO/VICODIN) 5-325 MG per tablet 1-2 tablet  1-2 tablet Oral Q6H PRN Jose Persia, MD   2 tablet at 09/23/22 1546   HYDROmorphone (DILAUDID) injection 1 mg  1 mg Intravenous  Q4H PRN Jose Persia, MD       insulin aspart (novoLOG) injection 0-9 Units  0-9 Units Subcutaneous TID WC Jose Persia, MD       ondansetron (ZOFRAN) tablet 4 mg  4 mg Oral Q6H PRN Jose Persia, MD       Or   ondansetron (ZOFRAN) injection 4 mg  4 mg Intravenous Q6H PRN Jose Persia, MD       polyethylene glycol (MIRALAX / GLYCOLAX) packet 17 g  17 g Oral Daily PRN Jose Persia, MD       potassium chloride SA (KLOR-CON M) CR tablet 40 mEq  40 mEq Oral Once Jose Persia, MD         Abtx:  Anti-infectives (From admission, onward)    Start     Dose/Rate Route Frequency Ordered Stop   09/24/22 1000  bictegravir-emtricitabine-tenofovir AF (BIKTARVY) 50-200-25 MG per tablet 1 tablet        1 tablet Oral Daily 09/23/22 1509     09/23/22 1215  cefTRIAXone (ROCEPHIN) 1 g in sodium chloride 0.9 % 100 mL IVPB        1 g 200 mL/hr over 30 Minutes Intravenous  Once 09/23/22 1210 09/23/22 1411       REVIEW OF SYSTEMS:  Const: negative fever, negative chills, negative weight loss Eyes: negative diplopia or visual changes, negative eye pain ENT: negative coryza, negative sore throat Resp: negative cough, hemoptysis, dyspnea Cards: negative for chest pain, palpitations, lower extremity edema GU: negative for frequency, dysuria and hematuria GI: Negative for abdominal pain, diarrhea, bleeding, constipation Skin: negative for rash and pruritus Heme: negative for easy bruising and gum/nose bleeding MS: as above Neurolo:negative for headaches, dizziness, vertigo, memory problems  Psych: negative for feelings of anxiety, depression  Endocrine: , diabetes Allergy/Immunology- levaquin- rash Objective:  VITALS:  BP (!) 148/88 (BP Location: Left Arm)   Pulse 92   Temp 98.4 F (36.9 C)   Resp 16   Ht 6' (1.829 m)   Wt 123.4 kg   SpO2 100%   BMI 36.89 kg/m   PHYSICAL EXAM:  General: Alert, cooperative, no distress at rest, appears stated age.  Head: Normocephalic, without  obvious abnormality, atraumatic. Eyes: Conjunctivae clear, anicteric sclerae. Pupils are equal ENT Nares normal. No drainage or sinus tenderness. Lips, mucosa, and tongue normal. No Thrush Neck: Supple, symmetrical, no adenopathy, thyroid: non tender no carotid bruit and no JVD. Back: No CVA tenderness. Lungs: Clear to auscultation bilaterally. No Wheezing or Rhonchi. No rales. Heart: Regular rate and rhythm, no murmur, rub or  gallop. Abdomen: Soft, non-tender,not distended. Bowel sounds normal. No masses Extremities: left knee swollen- fluid in the joint Left leg swollen Tender to move the joint passively Left foot- swollen- tenderness over the great toe area Skin: No rashes or lesions. Or bruising Lymph: Cervical, supraclavicular normal. Neurologic: Grossly non-focal Pertinent Labs Lab Results CBC    Component Value Date/Time   WBC 11.3 (H) 09/23/2022 0735   RBC 4.17 (L) 09/23/2022 0735   HGB 12.2 (L) 09/23/2022 0735   HGB 13.5 02/16/2022 0439   HCT 36.4 (L) 09/23/2022 0735   HCT 40.4 02/16/2022 0439   PLT 316 09/23/2022 0735   PLT 257 02/16/2022 0439   MCV 87.3 09/23/2022 0735   MCV 87 02/16/2022 0439   MCH 29.3 09/23/2022 0735   MCHC 33.5 09/23/2022 0735   RDW 14.8 09/23/2022 0735   RDW 15.0 02/16/2022 0439   LYMPHSABS 1.9 09/23/2022 0735   LYMPHSABS 2.2 02/16/2022 0439   MONOABS 2.3 (H) 09/23/2022 0735   EOSABS 0.0 09/23/2022 0735   EOSABS 0.1 02/16/2022 0439   BASOSABS 0.1 09/23/2022 0735   BASOSABS 0.1 02/16/2022 0439       Latest Ref Rng & Units 09/23/2022    7:35 AM 08/30/2022    4:17 PM 03/13/2022    4:03 PM  CMP  Glucose 70 - 99 mg/dL 121  101  144   BUN 6 - 20 mg/dL 12  22  14   $ Creatinine 0.61 - 1.24 mg/dL 1.40  1.70  1.18   Sodium 135 - 145 mmol/L 133  140  134   Potassium 3.5 - 5.1 mmol/L 3.0  3.0  3.2   Chloride 98 - 111 mmol/L 99  100  104   CO2 22 - 32 mmol/L 27  28  23   $ Calcium 8.9 - 10.3 mg/dL 8.3  9.5  8.9   Total Protein 6.5 - 8.1 g/dL 8.1    8.2   Total Bilirubin 0.3 - 1.2 mg/dL 0.9   0.6   Alkaline Phos 38 - 126 U/L 34   36   AST 15 - 41 U/L 20   26   ALT 0 - 44 U/L 27   28     Microbiology: BC  IMAGING RESULTS: Xray knee- possible effusion Left foot- soft tissue edema I have personally reviewed the films ? Impression/Recommendation 47 yr male with h/o HIV, DM, HTN presents with acute onset on left foot pain and swelling and involoving the left knee as well  Left knee  swelling with excruciating pain - r/o septic arthritis VS gouty arthritis- r/o Gonococcal arthritis- need arthrocentesis and send for cell count, crystals and culture. Recommend ortho consult Okay to continue ceftriaxone D.D gouty arthritis Left foot painful /swelling centered around the great toe R/o tenosynovitis  R/o acute gouty arthritis  Check urine for Gc/Chl Pcr Check throat swab for Gc/Chl  No obvious cellulitis currently but keep a close eye? ? ?HIV- on Biktarvy- continue Last VL < 20 and cd4 >500 ___________________________________________________ Discussed with patient, requesting provider Note:  This document was prepared using Dragon voice recognition software and may include unintentional dictation errors.

## 2022-09-23 NOTE — Consult Note (Signed)
ORTHOPAEDIC CONSULTATION  REQUESTING PHYSICIAN: Jose Persia, MD  Chief Complaint:   Left knee and lower extremity pain.  History of Present Illness: Aaron [name] is a 47 y.o. male with a history of diabetes, hypertension, gout, and asthma who normally lives independently in a group home.  Apparently, the patient was in his usual state of health 2 days ago when he began to notice increased pain and swelling in his left foot.  Over the past 48 hours, this pain and swelling have extended up his lower leg into his knee and distal thigh.  He became barely able to ambulate due to the severe pain and so presented to the emergency room.  In the emergency room, the patient has been afebrile.  However, his blood work was notable for a slightly elevated white count of 11.3 with no left shift, a sed rate of 72 and a uric acid of 9.5.  The patient states that he has had gout in the past but that his present symptoms "feels different".  The patient has been admitted for further workup and has been started on IV antibiotics.  Infectious diseases was consulted and they have recommended orthopedic consultation for knee aspiration to confirm the presence of gout as well as to rule out any infectious process, including gonorrhea.  Past Medical History:  Diagnosis Date   Asthma    Diabetes mellitus without complication ([place])    Hypertension    Past Surgical History:  Procedure Laterality Date   COLONOSCOPY WITH PROPOFOL N/A 09/21/2022   Procedure: COLONOSCOPY WITH PROPOFOL;  Surgeon: Lin Landsman, MD;  Location: [facility];  Service: Gastroenterology;  Laterality: N/A;   Social History   Socioeconomic History   Marital status: Single    Spouse name: Not on file   Number of children: Not on file   Years of education: Not on file   Highest education level: Not on file  Occupational History   Not on file  Tobacco Use    Smoking status: Every Day    Types: Cigarettes   Smokeless tobacco: Never  Substance and Sexual Activity   Alcohol use: Yes   Drug use: Yes    Types: Marijuana   Sexual activity: Not Currently    Partners: Male    Birth control/protection: Condom    Comment: declined condoms  Other Topics Concern   Not on file  Social History Narrative   Not on file   Social Determinants of Health   Financial Resource Strain: Not on file  Food Insecurity: No Food Insecurity (09/23/2022)   Hunger Vital Sign    Worried About Running Out of Food in the Last Year: Never true    Ran Out of Food in the Last Year: Never true  Transportation Needs: No Transportation Needs (09/23/2022)   PRAPARE - Hydrologist (Medical): No    Lack of Transportation (Non-Medical): No  Physical Activity: Not on file  Stress: Not on file  Social Connections: Not on file   History reviewed. No pertinent family history. Allergies  Allergen Reactions   Levaquin [Levofloxacin] Itching and Rash   Prior to Admission medications   Medication Sig Start Date End Date Taking? Authorizing Provider  albuterol (VENTOLIN HFA) 108 (90 Base) MCG/ACT inhaler Inhale 2 puffs into the lungs every 6 (six) hours as needed for wheezing or shortness of breath. 08/30/22   Fisher, Linden Dolin, PA-C  bictegravir-emtricitabine-tenofovir AF (BIKTARVY) 50-200-25 MG TABS tablet Take 1 tablet by mouth  daily. 08/09/22   Vu, Johnny Bridge T, MD  colchicine 0.6 MG tablet Take 1 tablet (0.6 mg total) by mouth 2 (two) times daily. 12/01/20 12/01/21  Fisher, Linden Dolin, PA-C  ipratropium-albuterol (DUONEB) 0.5-2.5 (3) MG/3ML SOLN Take 3 mLs by nebulization every 4 (four) hours as needed. 08/30/22   Fisher, Linden Dolin, PA-C  lisinopril-hydrochlorothiazide (ZESTORETIC) 20-25 MG tablet Take by mouth. 08/17/22   [provider]  predniSONE (STERAPRED UNI-PAK 21 TAB) 10 MG (21) TBPK tablet Take 6 pills on day one then decrease by 1 pill each day  08/30/22   Versie Starks, PA-C   DG Tibia/Fibula Left Port  Result Date: 09/23/2022 CLINICAL DATA:  Foot pain and swelling EXAM: PORTABLE LEFT TIBIA AND FIBULA - 2 VIEW COMPARISON:  None Available. FINDINGS: No fracture or dislocation. Preserved joint spaces and bone mineralization. No definite erosive change IMPRESSION: No acute osseous abnormality Electronically Signed   By: Jill Side M.D.   On: 09/23/2022 14:05   DG Foot Complete Left  Result Date: 09/23/2022 CLINICAL DATA:  Diabetic with swelling of left foot. EXAM: LEFT FOOT - COMPLETE 3+ VIEW COMPARISON:  None Available. FINDINGS: There is diffuse soft tissue edema about foot. Lateral subluxation of the fifth DIP joint is identified. No sign acute fracture or dislocation. No focal bone erosions. Formal IMPRESSION: 1. Diffuse soft tissue edema. 2. No signs of acute fracture or dislocation. No focal bone erosions identified to suggest acute osteomyelitis. 3. Lateral subluxation of the fifth DIP joint, which may be a chronic finding. Electronically Signed   By: Kerby Moors M.D.   On: 09/23/2022 14:05   DG Knee 1-2 Views Left  Result Date: 09/23/2022 CLINICAL DATA:  Diabetes.  Swelling. EXAM: LEFT KNEE - 1-2 VIEW COMPARISON:  None. FINDINGS: Limited two views study shows no evidence for an acute fracture or dislocation at the knee. Probable joint effusion although assessment limited by positioning. No worrisome lytic or sclerotic osseous abnormality. IMPRESSION: Negative. Electronically Signed   By: Misty Stanley M.D.   On: 09/23/2022 14:04   US Venous Img Lower Unilateral Left (DVT)  Result Date: 09/23/2022 CLINICAL DATA:  Pain and swelling of left lower leg EXAM: LEFT LOWER EXTREMITY VENOUS DOPPLER ULTRASOUND TECHNIQUE: Gray-scale sonography with compression, as well as color and duplex ultrasound, were performed to evaluate the deep venous system(s) from the level of the common femoral vein through the popliteal and proximal calf veins.  COMPARISON:  None Available. FINDINGS: VENOUS Normal compressibility of the common femoral, superficial femoral, and popliteal veins, as well as the visualized calf veins. Visualized portions of profunda femoral vein and great saphenous vein unremarkable. No filling defects to suggest DVT on grayscale or color Doppler imaging. Doppler waveforms show normal direction of venous flow, normal respiratory plasticity and response to augmentation. Limited views of the contralateral common femoral vein are unremarkable. Small 1.5 cm fluid collection within the left popliteal fossa, likely a Baker's cyst. IMPRESSION: No evidence of DVT in the left lower extremity. Electronically Signed   By: Margaretha Sheffield M.D.   On: 09/23/2022 11:06    Positive ROS: All other systems have been reviewed and were otherwise negative with the exception of those mentioned in the HPI and as above.  Physical Exam: General:  Alert, no acute distress Psychiatric:  Patient is competent for consent with normal mood and affect   Cardiovascular:  No pedal edema Respiratory:  No wheezing, non-labored breathing GI:  Abdomen is soft and non-tender Skin:  No lesions  in the area of chief complaint Neurologic:  Sensation intact distally Lymphatic:  No axillary or cervical lymphadenopathy  Orthopedic Exam:  The patient examination is limited to the left knee and lower extremity.  The left knee is notable for mild swelling diffusely around the knee, as well as a tense effusion.  He has mild tenderness diffusely around the knee but there is no erythema, ecchymosis, abrasions, or other skin abnormalities identified.  He has difficulty performing a straight leg raise or actively flexing his knee.  However, with assistance, he is able to tolerate knee flexion from 0 to 60 degrees with only mild discomfort.  He has mild-moderate swelling in his foot, but only mild swelling in the lower leg.  Again, no erythema, ecchymosis, abrasions, or other skin  abnormalities are identified.  Grossly, he is neurovascularly intact to the left lower extremity and foot.  X-rays:  Recent AP and lateral x-rays of the left knee are available for review and have been reviewed by myself these films are notable for a large effusion, but otherwise unremarkable.  No fractures, lytic lesions, or other acute bony processes are identified.  Assessment: Left knee effusion of unclear etiology with left lower extremity swelling.  Plan: The treatment options have been discussed with the patient, specifically the rationale for performing a knee aspiration.  After obtaining verbal consent, the left knee is aspirated sterilely of 130 cc of yellowish/straw-colored turbid fluid.  The patient tolerated the procedure well.  Samples of this fluid were sent for cell count differential, crystals, culture and sensitivity, gonorrhea culture, and Gram stain.  Based on the patient's examination findings and the appearance of the fluid, I feel that most likely the knee effusion is the result of gout.  However, we will await the results of this knee aspiration to provide [place] with the conclusive answer.  Meanwhile, the patient can be mobilized as symptoms permit, weightbearing as tolerated on the left leg.  He may receive medication for pain as deemed appropriate medically.  Thank you for asking me to participate in the care of this pleasant yet unfortunate man.  I will be happy to follow him with you.   Pascal Lux, MD  Beeper #:  [phone]  09/23/2022 6:52 PM

## 2022-09-23 NOTE — Progress Notes (Signed)
PHARMACIST - PHYSICIAN COMMUNICATION  CONCERNING:  Enoxaparin (Lovenox) for DVT Prophylaxis    RECOMMENDATION: Patient was prescribed enoxaprin 36m q24 hours for VTE prophylaxis.   Filed Weights   09/23/22 0721  Weight: 123.4 kg (272 lb)    Body mass index is 36.89 kg/m.  Estimated Creatinine Clearance: 89.4 mL/min (A) (by C-G formula based on SCr of 1.4 mg/dL (H)).   Based on [facility] is candidate for enoxaparin 0.574mkg TBW SQ every 24 hours based on BMI being >30.   DESCRIPTION: [facility]  Patient is now receiving enoxaparin 62.5 mg every 24 hours   ShPernell DuprePharmD, BCPS Clinical Pharmacist 09/23/2022 2:45 PM

## 2022-09-23 NOTE — Assessment & Plan Note (Signed)
Creatinine is elevated at 1.4, however previously 1.7.  Patient has a history of fluctuating creatinine.  I wonder if there is an element of CKD given history of hypertension.  - Will hold home lisinopril and HCTZ for now

## 2022-09-23 NOTE — ED Notes (Signed)
Patient is at ultrasound.

## 2022-09-23 NOTE — Assessment & Plan Note (Signed)
Continue home antihypertensives 

## 2022-09-23 NOTE — Assessment & Plan Note (Signed)
Currently diet controlled.  Last A1c within goal at 6.5%  - SSI, sensitive

## 2022-09-24 DIAGNOSIS — M79605 Pain in left leg: Secondary | ICD-10-CM | POA: Diagnosis not present

## 2022-09-24 DIAGNOSIS — M79662 Pain in left lower leg: Secondary | ICD-10-CM | POA: Diagnosis not present

## 2022-09-24 DIAGNOSIS — Z66 Do not resuscitate: Secondary | ICD-10-CM | POA: Diagnosis not present

## 2022-09-24 DIAGNOSIS — Z6836 Body mass index (BMI) 36.0-36.9, adult: Secondary | ICD-10-CM | POA: Diagnosis not present

## 2022-09-24 DIAGNOSIS — F17210 Nicotine dependence, cigarettes, uncomplicated: Secondary | ICD-10-CM | POA: Diagnosis not present

## 2022-09-24 DIAGNOSIS — M109 Gout, unspecified: Secondary | ICD-10-CM | POA: Diagnosis not present

## 2022-09-24 DIAGNOSIS — E876 Hypokalemia: Secondary | ICD-10-CM | POA: Diagnosis not present

## 2022-09-24 DIAGNOSIS — E118 Type 2 diabetes mellitus with unspecified complications: Secondary | ICD-10-CM | POA: Diagnosis not present

## 2022-09-24 DIAGNOSIS — M25562 Pain in left knee: Secondary | ICD-10-CM | POA: Diagnosis present

## 2022-09-24 DIAGNOSIS — Z881 Allergy status to other antibiotic agents status: Secondary | ICD-10-CM | POA: Diagnosis not present

## 2022-09-24 DIAGNOSIS — J45909 Unspecified asthma, uncomplicated: Secondary | ICD-10-CM | POA: Diagnosis not present

## 2022-09-24 DIAGNOSIS — M7989 Other specified soft tissue disorders: Secondary | ICD-10-CM | POA: Diagnosis not present

## 2022-09-24 DIAGNOSIS — M25462 Effusion, left knee: Secondary | ICD-10-CM | POA: Diagnosis not present

## 2022-09-24 DIAGNOSIS — Z21 Asymptomatic human immunodeficiency virus [HIV] infection status: Secondary | ICD-10-CM | POA: Diagnosis not present

## 2022-09-24 DIAGNOSIS — E669 Obesity, unspecified: Secondary | ICD-10-CM | POA: Diagnosis not present

## 2022-09-24 DIAGNOSIS — Z79899 Other long term (current) drug therapy: Secondary | ICD-10-CM | POA: Diagnosis not present

## 2022-09-24 DIAGNOSIS — N179 Acute kidney failure, unspecified: Secondary | ICD-10-CM | POA: Diagnosis not present

## 2022-09-24 DIAGNOSIS — I10 Essential (primary) hypertension: Secondary | ICD-10-CM | POA: Diagnosis not present

## 2022-09-24 LAB — BASIC METABOLIC PANEL
Anion gap: 7 (ref 5–15)
BUN: 16 mg/dL (ref 6–20)
CO2: 28 mmol/L (ref 22–32)
Calcium: 8.6 mg/dL — ABNORMAL LOW (ref 8.9–10.3)
Chloride: 103 mmol/L (ref 98–111)
Creatinine, Ser: 1.22 mg/dL (ref 0.61–1.24)
GFR, Estimated: >60 mL/min (ref >60)
Glucose, Bld: 138 mg/dL — ABNORMAL HIGH (ref 70–99)
Potassium: 3.6 mmol/L (ref 3.5–5.1)
Sodium: 138 mmol/L (ref 135–145)

## 2022-09-24 LAB — CBC WITH DIFFERENTIAL/PLATELET
Abs Immature Granulocytes: 0.03 10*3/uL (ref 0.00–0.07)
Basophils Absolute: 0 10*3/uL (ref 0.0–0.1)
Basophils Relative: 0 %
Eosinophils Absolute: 0 10*3/uL (ref 0.0–0.5)
Eosinophils Relative: 0 %
HCT: 37.9 % — ABNORMAL LOW (ref 39.0–52.0)
Hemoglobin: 12.4 g/dL — ABNORMAL LOW (ref 13.0–17.0)
Immature Granulocytes: 0 %
Lymphocytes Relative: 17 %
Lymphs Abs: 2.1 10*3/uL (ref 0.7–4.0)
MCH: 29 pg (ref 26.0–34.0)
MCHC: 32.7 g/dL (ref 30.0–36.0)
MCV: 88.6 fL (ref 80.0–100.0)
Monocytes Absolute: 2 10*3/uL — ABNORMAL HIGH (ref 0.1–1.0)
Monocytes Relative: 16 %
Neutro Abs: 8.2 10*3/uL — ABNORMAL HIGH (ref 1.7–7.7)
Neutrophils Relative %: 67 %
Platelets: 345 10*3/uL (ref 150–400)
RBC: 4.28 MIL/uL (ref 4.22–5.81)
RDW: 14.6 % (ref 11.5–15.5)
WBC: 12.4 10*3/uL — ABNORMAL HIGH (ref 4.0–10.5)
nRBC: 0 % (ref 0.0–0.2)

## 2022-09-24 LAB — GLUCOSE, CAPILLARY
Glucose-Capillary: 111 mg/dL — ABNORMAL HIGH (ref 70–99)
Glucose-Capillary: 168 mg/dL — ABNORMAL HIGH (ref 70–99)
Glucose-Capillary: 223 mg/dL — ABNORMAL HIGH (ref 70–99)
Glucose-Capillary: 237 mg/dL — ABNORMAL HIGH (ref 70–99)

## 2022-09-24 LAB — RPR
RPR Ser Ql: REACTIVE — AB
RPR Titer: 1:4 {titer}

## 2022-09-24 LAB — CHLAMYDIA/NGC RT PCR (ARMC ONLY)
Chlamydia Tr: NOT DETECTED
N gonorrhoeae: NOT DETECTED

## 2022-09-24 MED ORDER — TRIAMCINOLONE ACETONIDE 40 MG/ML IJ SUSP
80.0000 mg | Freq: Once | INTRAMUSCULAR | Status: DC
  Filled 2022-09-24: qty 2

## 2022-09-24 MED ORDER — NICOTINE POLACRILEX 2 MG MT GUM
2.0000 mg | CHEWING_GUM | OROMUCOSAL | Status: DC | PRN
  Administered 2022-09-24 (×2): 2 mg via ORAL
  Filled 2022-09-24 (×3): qty 1

## 2022-09-24 MED ORDER — SODIUM CHLORIDE 0.9 % IV SOLN
1.0000 g | INTRAVENOUS | Status: DC
  Administered 2022-09-24: 1 g via INTRAVENOUS
  Filled 2022-09-24: qty 10
  Filled 2022-09-24: qty 1

## 2022-09-24 MED ORDER — BUPIVACAINE HCL (PF) 0.5 % IJ SOLN
10.0000 mL | Freq: Once | INTRAMUSCULAR | Status: DC
  Filled 2022-09-24: qty 10

## 2022-09-24 NOTE — Progress Notes (Signed)
Patient ID: Aaron [name], male   DOB: Mar 09, 1976, 47 y.o.   MRN: [id number]  Subjective: The patient notes moderate improvement in his left knee symptoms since the aspiration last night, but still notes swelling and discomfort, especially with weightbearing on his leg.  He has been able to ambulate to the bathroom using a walker for balance and support.  He denies any reinjury to the knee, and denies any fevers or chills.   Objective: Vital signs in last 24 hours: Temp:  [98.1 F (36.7 C)-98.4 F (36.9 C)] 98.4 F (36.9 C) (02/17 0813) Pulse Rate:  [79-92] 85 (02/17 0813) Resp:  [16-20] 18 (02/17 0813) BP: (113-148)/(63-91) 139/84 (02/17 0813) SpO2:  [95 %-100 %] 100 % (02/17 0813)  Intake/Output from previous day: 02/16 0701 - 02/17 0700 In: 1100 [IV Piggyback:1100] Out: -  Intake/Output this shift: Total I/O In: -  Out: 800 [Urine:800]  Recent Labs    09/23/22 0735 09/24/22 0626  HGB 12.2* 12.4*   Recent Labs    09/23/22 0735 09/24/22 0626  WBC 11.3* 12.4*  RBC 4.17* 4.28  HCT 36.4* 37.9*  PLT 316 345   Recent Labs    09/23/22 0735 09/24/22 0626  NA 133* 138  K 3.0* 3.6  CL 99 103  CO2 27 28  BUN 12 16  CREATININE 1.40* 1.22  GLUCOSE 121* 138*  CALCIUM 8.3* 8.6*   No results for input(s): "LABPT", "INR" in the last 72 hours.  Physical Exam: Orthopedic examination again is limited to the left knee and lower extremity.  There is perhaps mild residual swelling around the knee, but no erythema, ecchymosis, abrasions, or other skin abnormalities identified.  The left knee still exhibits a 1+ effusion.  He exhibits minimal residual tenderness to palpation diffusely around the knee.  He is able to move his knee more freely, tolerating the flexion from 0 to >90 degrees.  He remains neurovascularly intact to the left lower extremity and foot.  Assessment: Acute left knee effusion, probably secondary to gout, improved symptomatically.  Plan: The treatment  options are reviewed with the patient.  The patient does note moderate improvement in his symptoms, but he still notes sufficient discomfort that he would like to proceed with a steroid injection.  Given that his knee aspirate showed no evidence for organisms on the Gram stain and the cell count was only 21,000, I feel this is a reasonable option.  Therefore, after obtaining verbal consent, the left knee was aspirated sterilely of another 50 cc of blood-tinged turbid fluid before the knee was injected sterilely using using a solution of 2 cc of Kenalog 40 (80 mg) and 8 cc of 0.5% Sensorcaine.  The patient tolerated the procedure well.  The patient may continue to be mobilized, weightbearing as tolerated on the left leg and using a cane or walker as necessary for balance and support.   Marshall Cork Yamila Cragin 09/24/2022, 2:02 PM

## 2022-09-24 NOTE — Progress Notes (Signed)
PROGRESS NOTE    Aaron [name]  [id number] DOB: 1976-01-17 DOA: 09/23/2022 PCP: [name], NP   Brief Narrative:  This 47 years old Male with PMH significant for type 2 diabetes, HIV on Biktarvy, Hypertension, asthma, gout presented in the ED with c/o: Left leg pain for 2 days. He had sudden onset of left foot swelling and pain which has gradually increased and progressed to involve his ankle then calf and now spread to the top of his left knee.  He has been unable to bend his knee or bear any weight.  He denies any recent trauma or fall.  Initial workup in the ED revealed WBC 11.3 K, serum creatinine 1.40, Uric acid 9.5, ESR 72.  Venous duplex left lower extremity negative for DVT.  Patient was given a dose of prednisone and ceftriaxone and is admitted for further evaluation.  Orthopedics and infectious diseases was consulted for suspicion for disseminated gonococcal infection.  Assessment & Plan:   Principal Problem:   Acute leg pain, left Active Problems:   AKI (acute kidney injury) ([place])   Hypokalemia   Essential hypertension   Type 2 diabetes mellitus with complication, without long-term current use of insulin (HCC)   HIV (human immunodeficiency virus infection) ([place])  Acute Left leg pain: Suspected gouty arthritis Patient presented with 2-day history of left foot pain and swelling that has now progressed up to the knee.   The entire affected area is swollen with significant tenderness to palpation, however there is no evidence of skin breakdown, erythema, rash or ulceration.  Patient denies any recent URI symptoms.   Differential includes both infectious and noninfectious etiology including myositis, cellulitis, polyarticular gouty arthritis.  Infectious diseases consulted, recommended possible arthrocentesis. GC chlamydia negative, RPR+ Continue IV Rocephin 1 g daily. He was given prednisone 60 mg once Check CK, TSH and blood cultures. MRI left foot: Findings mainly  consistent with acute gouty arthritis. Orthopedics  consulted and s/p arthrocentesis.  Follow-up labs.   Acute kidney injury: Creatinine is elevated at 1.4, however previously 1.7.    Serum creatinine has improved.  Back to normal   HIV: Last viral load negative. Continue Biktarvy   Type 2 diabetes mellitus: Currently diet controlled.   Last A1c within goal at 6.5% SSI, sensitive   Essential hypertension Continue home antihypertensives.   Hypokalemia: Replaced and resolved.  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   Admitted for left foot and knee pain found to have gouty arthritis.  Infectious disease and orthopedics is consulted.  Consultants:  Infectious diseases Orthopedics  Procedures:  Arthrocentesis Antimicrobials: Ceftriaxone  Subjective: Patient was seen and examined at bedside.  Overnight events noted.   Patient still unable to lift his left leg with/out significant pain.  Objective: Vitals:   09/23/22 1537 09/23/22 2048 09/24/22 0619 09/24/22 0813  BP: (!) 148/88 126/71 113/63 139/84  Pulse: 92 88 79 85  Resp: 16 20 20 18  $ Temp: 98.4 F (36.9 C) 98.1 F (36.7 C) 98.2 F (36.8 C) 98.4 F (36.9 C)  TempSrc:    Oral  SpO2: 100% 99% 98% 100%  Weight:      Height:        Intake/Output Summary (Last 24 hours) at 09/24/2022 1133 Last data filed at 09/24/2022 0904 Gross per 24 hour  Intake 1100 ml  Output 800 ml  Net 300 ml   Filed Weights   09/23/22 0721  Weight: 123.4 kg    Examination:  General exam: Appears calm and comfortable, not in any acute distress. Respiratory system: Clear to auscultation. Respiratory effort normal.  RR 15 Cardiovascular system: S1 & S2 heard, regular rate and rhythm, no murmur. Gastrointestinal system: Abdomen is soft, non  tender, non distended, BS+ Central nervous system: Alert and oriented x 3. No focal neurological deficits. Extremities: Leg swelling, tender, warm, unable to lift above bed due to pain. Skin: No rashes, lesions or ulcers Psychiatry: Judgement and insight appear normal. Mood & affect appropriate.     Data Reviewed: I have personally reviewed following labs and imaging studies  CBC: Recent Labs  Lab 09/23/22 0735 09/24/22 0626  WBC 11.3* 12.4*  NEUTROABS 7.0 8.2*  HGB 12.2* 12.4*  HCT 36.4* 37.9*  MCV 87.3 88.6  PLT 316 [id number]   Basic Metabolic Panel: Recent Labs  Lab 09/23/22 0735 09/24/22 0626  NA 133* 138  K 3.0* 3.6  CL 99 103  CO2 27 28  GLUCOSE 121* 138*  BUN 12 16  CREATININE 1.40* 1.22  CALCIUM 8.3* 8.6*   GFR: Estimated Creatinine Clearance: 102.6 mL/min (by C-G formula based on SCr of 1.22 mg/dL). Liver Function Tests: Recent Labs  Lab 09/23/22 0735  AST 20  ALT 27  ALKPHOS 34*  BILITOT 0.9  PROT 8.1  ALBUMIN 3.7   No results for input(s): "LIPASE", "AMYLASE" in the last 168 hours. No results for input(s): "AMMONIA" in the last 168 hours. Coagulation Profile: No results for input(s): "INR", "PROTIME" in the last 168 hours. Cardiac Enzymes: Recent Labs  Lab 09/23/22 0735  CKTOTAL 348   BNP (last 3 results) No results for input(s): "PROBNP" in the last 8760 hours. HbA1C: Recent Labs    09/23/22 1632  HGBA1C 6.5*   CBG: Recent Labs  Lab 09/23/22 1539 09/23/22 2014 09/23/22 2048 09/24/22 0810  GLUCAP 168* 216* 207* 111*   Lipid Profile: No results for input(s): "CHOL", "HDL", "LDLCALC", "TRIG", "CHOLHDL", "LDLDIRECT" in the last 72 hours. Thyroid Function Tests: Recent Labs    09/23/22 0735  TSH 0.302*   Anemia Panel: No results for input(s): "VITAMINB12", "FOLATE", "FERRITIN", "TIBC", "IRON", "RETICCTPCT" in the last 72 hours. Sepsis Labs: No results for input(s): "PROCALCITON", "LATICACIDVEN" in the last 168 hours.  Recent  Results (from the past 240 hour(s))  Culture, blood (Routine X 2) w Reflex to ID Panel     Status: None (Preliminary result)   Collection Time: 09/23/22  4:32 PM   Specimen: Left Antecubital; Blood  Result Value Ref Range Status   Specimen Description LEFT ANTECUBITAL  Final   Special Requests   Final    BOTTLES DRAWN AEROBIC AND ANAEROBIC Blood Culture adequate volume   Culture   Final    NO GROWTH < 24 HOURS Performed at [facility], [address] [place], [place] [address]    Report Status PENDING  Incomplete  Culture, blood (Routine X 2) w Reflex to ID Panel     Status: None (Preliminary result)   Collection Time: 09/23/22  4:40 PM   Specimen: Right Antecubital; Blood  Result Value Ref Range Status   Specimen Description RIGHT ANTECUBITAL  Final   Special Requests   Final    BOTTLES DRAWN AEROBIC AND ANAEROBIC Blood Culture adequate volume   Culture   Final    NO GROWTH < 24 HOURS  Performed at [facility], [address] [place], [place] [address]    Report Status PENDING  Incomplete  Body fluid culture w Gram Stain     Status: None (Preliminary result)   Collection Time: 09/23/22  6:49 PM   Specimen: Body Fluid  Result Value Ref Range Status   Specimen Description   Final    FLUID Performed at [facility], [address] [place], [place] [address]    Special Requests   Final    [facility] KNEE Performed at [facility], [address] [place], [place] [address]    Gram Stain   Final    FEW WBC PRESENT, PREDOMINANTLY PMN NO ORGANISMS SEEN Performed at [facility], [place] [address] [place], [place] [address]    Culture PENDING  Incomplete   Report Status PENDING  Incomplete  Chlamydia/NGC rt PCR ([place] only)     Status: None   Collection Time: 09/24/22  6:00 AM   Specimen: Urine  Result Value Ref Range Status   Specimen source GC/Chlam URINE, RANDOM  Final   Chlamydia Tr NOT DETECTED NOT DETECTED Final   N  gonorrhoeae NOT DETECTED NOT DETECTED Final    Comment: (NOTE) This CT/NG assay has not been evaluated in patients with a history of  hysterectomy. Performed at [facility], [address], [place], [place] [address]    Radiology Studies: MR FOOT LEFT W WO CONTRAST  Result Date: 09/23/2022 CLINICAL DATA:  Acute onset pain and swelling. EXAM: MRI OF THE LEFT FOREFOOT WITHOUT AND WITH CONTRAST TECHNIQUE: Multiplanar, multisequence MR imaging of the left foot was performed both before and after administration of intravenous contrast. CONTRAST:  56m GADAVIST GADOBUTROL 1 MMOL/ML IV SOLN COMPARISON:  Radiographs 09/23/2022 FINDINGS: Evidence of a significant inflammatory arthropathy involving the first MTP joint. There is a large joint effusion, enhancing synovitis and a large medial erosion involving the first metatarsal head away from the joint. There is some surrounding soft tissue swelling/edema. Findings are most typical for gouty arthritis. There are also joint effusions involving the other MTP joints but no definite erosive findings. Degenerative changes involving the third tarsal metatarsal joint and also the cuboid joint articulating with the fourth and fifth metatarsals. Joint effusions, subchondral cystic change and marrow edema. Diffuse subcutaneous soft tissue swelling/edema/fluid most notably involving the dorsum of the foot but no soft tissue abscess. Mild myofasciitis without findings for pyomyositis. The major tendons and ligaments appear intact. IMPRESSION: 1. Evidence of a significant inflammatory arthropathy involving the first MTP joint. There is a large joint effusion, enhancing synovitis and a large medial erosion involving the first metatarsal head away from the joint. Septic arthritis cannot be totally excluded by imaging but these findings are most typical for gouty arthritis. 2. Arthropathic changes at the tarsal metatarsal joints also as detailed above. 3. Diffuse  subcutaneous soft tissue swelling/edema/fluid most notably involving the dorsum of the foot but no soft tissue abscess. 4. Mild myofasciitis without findings for pyomyositis. Electronically Signed   By: PMarijo SanesM.D.   On: 09/23/2022 19:27   DG Tibia/Fibula Left Port  Result Date: 09/23/2022 CLINICAL DATA:  Foot pain and swelling EXAM: PORTABLE LEFT TIBIA AND FIBULA - 2 VIEW COMPARISON:  None Available. FINDINGS: No fracture or dislocation. Preserved joint spaces and bone mineralization. No definite erosive change IMPRESSION: No acute osseous abnormality Electronically Signed   By: AJill SideM.D.   On: 09/23/2022 14:05   DG Foot Complete Left  Result Date: 09/23/2022 CLINICAL DATA:  Diabetic with swelling of left foot. EXAM: LEFT FOOT - COMPLETE 3+ VIEW COMPARISON:  None Available. FINDINGS: There is diffuse soft tissue edema about foot. Lateral subluxation of the fifth DIP joint is identified. No sign acute fracture or dislocation. No focal bone erosions. Formal IMPRESSION: 1. Diffuse soft tissue edema. 2. No signs of acute fracture or dislocation. No focal bone erosions identified to suggest acute osteomyelitis. 3. Lateral subluxation of the fifth DIP joint, which may be a chronic finding. Electronically Signed   By: Kerby Moors M.D.   On: 09/23/2022 14:05   DG Knee 1-2 Views Left  Result Date: 09/23/2022 CLINICAL DATA:  Diabetes.  Swelling. EXAM: LEFT KNEE - 1-2 VIEW COMPARISON:  None. FINDINGS: Limited two views study shows no evidence for an acute fracture or dislocation at the knee. Probable joint effusion although assessment limited by positioning. No worrisome lytic or sclerotic osseous abnormality. IMPRESSION: Negative. Electronically Signed   By: Misty Stanley M.D.   On: 09/23/2022 14:04   US Venous Img Lower Unilateral Left (DVT)  Result Date: 09/23/2022 CLINICAL DATA:  Pain and swelling of left lower leg EXAM: LEFT LOWER EXTREMITY VENOUS DOPPLER ULTRASOUND TECHNIQUE: Gray-scale  sonography with compression, as well as color and duplex ultrasound, were performed to evaluate the deep venous system(s) from the level of the common femoral vein through the popliteal and proximal calf veins. COMPARISON:  None Available. FINDINGS: VENOUS Normal compressibility of the common femoral, superficial femoral, and popliteal veins, as well as the visualized calf veins. Visualized portions of profunda femoral vein and great saphenous vein unremarkable. No filling defects to suggest DVT on grayscale or color Doppler imaging. Doppler waveforms show normal direction of venous flow, normal respiratory plasticity and response to augmentation. Limited views of the contralateral common femoral vein are unremarkable. Small 1.5 cm fluid collection within the left popliteal fossa, likely a Baker's cyst. IMPRESSION: No evidence of DVT in the left lower extremity. Electronically Signed   By: Margaretha Sheffield M.D.   On: 09/23/2022 11:06    Scheduled Meds:  bictegravir-emtricitabine-tenofovir AF  1 tablet Oral Daily   enoxaparin (LOVENOX) injection  60 mg Subcutaneous Q24H   insulin aspart  0-9 Units Subcutaneous TID WC   Continuous Infusions:  cefTRIAXone (ROCEPHIN)  IV       LOS: 0 days    Time spent: 50 mins    Shawna Clamp, MD [facility]   If 7PM-7AM, please contact night-coverage

## 2022-09-25 DIAGNOSIS — M79605 Pain in left leg: Secondary | ICD-10-CM | POA: Diagnosis not present

## 2022-09-25 DIAGNOSIS — M109 Gout, unspecified: Secondary | ICD-10-CM | POA: Diagnosis not present

## 2022-09-25 DIAGNOSIS — M25462 Effusion, left knee: Secondary | ICD-10-CM | POA: Diagnosis not present

## 2022-09-25 LAB — BASIC METABOLIC PANEL
Anion gap: 6 (ref 5–15)
BUN: 18 mg/dL (ref 6–20)
CO2: 27 mmol/L (ref 22–32)
Calcium: 8.7 mg/dL — ABNORMAL LOW (ref 8.9–10.3)
Chloride: 102 mmol/L (ref 98–111)
Creatinine, Ser: 1.13 mg/dL (ref 0.61–1.24)
GFR, Estimated: >60 mL/min (ref >60)
Glucose, Bld: 247 mg/dL — ABNORMAL HIGH (ref 70–99)
Potassium: 4 mmol/L (ref 3.5–5.1)
Sodium: 135 mmol/L (ref 135–145)

## 2022-09-25 LAB — CBC
HCT: 38.7 % — ABNORMAL LOW (ref 39.0–52.0)
Hemoglobin: 12.6 g/dL — ABNORMAL LOW (ref 13.0–17.0)
MCH: 28.6 pg (ref 26.0–34.0)
MCHC: 32.6 g/dL (ref 30.0–36.0)
MCV: 88 fL (ref 80.0–100.0)
Platelets: 374 10*3/uL (ref 150–400)
RBC: 4.4 MIL/uL (ref 4.22–5.81)
RDW: 14.4 % (ref 11.5–15.5)
WBC: 9.3 10*3/uL (ref 4.0–10.5)
nRBC: 0 % (ref 0.0–0.2)

## 2022-09-25 LAB — MAGNESIUM: Magnesium: 2.6 mg/dL — ABNORMAL HIGH (ref 1.7–2.4)

## 2022-09-25 LAB — PHOSPHORUS: Phosphorus: 3.3 mg/dL (ref 2.5–4.6)

## 2022-09-25 LAB — GLUCOSE, CAPILLARY
Glucose-Capillary: 210 mg/dL — ABNORMAL HIGH (ref 70–99)
Glucose-Capillary: 263 mg/dL — ABNORMAL HIGH (ref 70–99)

## 2022-09-25 MED ORDER — ALLOPURINOL 100 MG PO TABS: 100.0000 mg | ORAL_TABLET | Freq: Every day | ORAL | 2 refills | Status: AC

## 2022-09-25 MED ORDER — PREDNISONE 10 MG (21) PO TBPK: ORAL_TABLET | ORAL | 0 refills | Status: DC

## 2022-09-25 MED ORDER — HYDROCODONE-ACETAMINOPHEN 5-325 MG PO TABS: 1.0000 | ORAL_TABLET | Freq: Four times a day (QID) | ORAL | 0 refills | Status: AC | PRN

## 2022-09-25 MED ORDER — DEXTROSE 5 % IV SOLN
1500.0000 mg | Freq: Once | INTRAVENOUS | Status: DC
  Filled 2022-09-25: qty 75

## 2022-09-25 MED ORDER — DOXYCYCLINE HYCLATE 100 MG PO TABS: 100.0000 mg | ORAL_TABLET | Freq: Two times a day (BID) | ORAL | 0 refills | Status: AC

## 2022-09-25 MED ORDER — DOXYCYCLINE HYCLATE 100 MG PO TABS
100.0000 mg | ORAL_TABLET | Freq: Two times a day (BID) | ORAL | Status: DC
  Administered 2022-09-25: 100 mg via ORAL
  Filled 2022-09-25: qty 1

## 2022-09-25 NOTE — Progress Notes (Signed)
Patient ID: Aaron [name], male   DOB: [birth year], 47 y.o.   MRN: [id number]  Subjective: The patient notes that his left knee symptoms are much improved following the steroid injection administered yesterday.  He is now able to ambulate full weightbearing on his leg, and was able to ambulate in the halls with the immobilization specialist, using a walker for balance and support.  He denies any pain in the knee at this time and denies any fevers or chills.  His cultures remain negative at this time.   Objective: Vital signs in last 24 hours: Temp:  [97.8 F (36.6 C)-99 F (37.2 C)] 97.8 F (36.6 C) (02/18 0743) Pulse Rate:  [79-100] 80 (02/18 0743) Resp:  [16-20] 17 (02/18 0743) BP: (111-153)/(87-94) 111/94 (02/18 0743) SpO2:  [100 %] 100 % (02/18 0743)  Intake/Output from previous day: 02/17 0701 - 02/18 0700 In: 100 [IV Piggyback:100] Out: 1100 [Urine:1100] Intake/Output this shift: Total I/O In: -  Out: 300 [Urine:300]  Recent Labs    09/23/22 0735 09/24/22 0626 09/25/22 0603  HGB 12.2* 12.4* 12.6*   Recent Labs    09/24/22 0626 09/25/22 0603  WBC 12.4* 9.3  RBC 4.28 4.40  HCT 37.9* 38.7*  PLT 345 374   Recent Labs    09/24/22 0626 09/25/22 0603  NA 138 135  K 3.6 4.0  CL 103 102  CO2 28 27  BUN 16 18  CREATININE 1.22 1.13  GLUCOSE 138* 247*  CALCIUM 8.6* 8.7*   No results for input(s): "LABPT", "INR" in the last 72 hours.  Physical Exam: Orthopedic examination again is limited to the left knee and lower leg.  There is perhaps minimal swelling around the knee extending into the lower leg and foot.  The knee effusion is minimal.  He has no tenderness to palpation over the medial or lateral aspects of the knee, nor does he have any peripatellar pain.  He is able to perform straight leg raise and is able to actively flex his knee from 0 to 120 degrees without pain.  He is able to actually stand up and take steps in the room without any assistive devices,  although with a mild limp.  He is neurovascularly intact to the left lower extremity and foot.  Assessment: Acute left knee effusion, presently improved symptomatically.  Plan: The patient may continue to be mobilized weightbearing as tolerated on the left leg using a cane or walker as necessary for balance and support.  He should be treated for gout with a longer acting medication and should have follow-up arranged with an endocrinologist/rheumatologist for long-term management of the gout.  I will sign off at this time as he is doing much better symptomatically.  Thank you for asking me to participate in the care of this most pleasant patient.  He may follow-up in my office on an as necessary basis.    Marshall Cork Kenitra Leventhal 09/25/2022, 9:28 AM

## 2022-09-25 NOTE — TOC Transition Note (Signed)
Transition of Care [facility]) - CM/SW Discharge Note   Patient Details  Name: Aaron [name] MRN: [id number] Date of Birth: [birth year]  Transition of [facility]) CM/SW Contact:  Izola Price, RN Phone Number: 09/25/2022, 2:21 PM   Clinical Narrative: 2/18: Patient had discharge orders but some Abx/culture issues pending and patient left due to group home arriving but not waiting. RW ordered via [employer] and will deliver to [place] address in chart, confirmed with unit RN. [name] RN CM      Final next level of care: Group Home Barriers to Discharge:  (Left prematurely before abx finished due to [facility] ride.)   Patient Goals and CMS Choice      Discharge Placement                         Discharge Plan and Services Additional resources added to the After Visit Summary for                  DME Arranged: Walker rolling DME Agency: [employer]       HH Arranged: NA HH Agency: NA        Social Determinants of Health (SDOH) Interventions SDOH Screenings   Food Insecurity: No Food Insecurity (09/23/2022)  Housing: Low Risk  (09/23/2022)  Transportation Needs: No Transportation Needs (09/23/2022)  Utilities: Not At Risk (09/23/2022)  Depression (PHQ2-9): Low Risk  (06/15/2022)  Tobacco Use: High Risk (09/23/2022)     Readmission Risk Interventions     No data to display

## 2022-09-25 NOTE — Discharge Summary (Addendum)
Physician Discharge Summary  COE SERIGHT [id number] DOB: [birth year] DOA: 09/23/2022  PCP: [name], NP  Admit date: 09/23/2022  Discharge date: 09/25/2022  Admitted From: Home  Disposition:   Group Home  Recommendations for Outpatient Follow-up:  Follow up with PCP in 1-2 weeks. Please obtain BMP/CBC in one week. Advised to follow-up with Infectious disease Dr. Gale Journey as scheduled. Advised to take doxycycline 100 mg twice daily for 28 days. Advised to take allopurinol 100 mg daily for gouty arthritis. Advised to take prednisone taper as prescribed.  Home Health: None Equipment/Devices:None  Discharge Condition: Stable CODE STATUS:Full code Diet recommendation: Heart Healthy   Brief [facility] Course: This 47 years old Male with PMH significant for type 2 diabetes, HIV on Biktarvy, Hypertension, asthma, gout presented in the ED with c/o: Left leg pain for 2 days. He had sudden onset of left foot swelling and pain which has gradually increased and progressed to involve his ankle then calf and now spread to the top of his left knee.  He has been unable to bend his knee or bear any weight.  He denies any recent trauma or fall.  Initial workup in the ED revealed WBC 11.3 K, serum creatinine 1.40, Uric acid 9.5, ESR 72.  Venous duplex left lower extremity negative for DVT.  Patient was given a dose of prednisone and ceftriaxone and is admitted for further evaluation. Orthopedics and Infectious diseases was consulted for suspicion for Septic arthritis.  Patient was continued on IV antibiotics.  GC chlamydia negative.  Patient underwent arthrocentesis.  Fluid count showed 21 K 95% neutrophils gram stain negative no crystals.  MRI left foot findings consistent with acute gouty arthritis. Orthopedics signed off ,  recommended patient has made significant improvement after Kenalog injection in the knee. Patient ambulated with walker , He felt better.  Patient wanted to be discharged.   Infectious disease consulted recommended patient can be discharged on doxycycline 100 mg twice daily for 4 weeks, allopurinol 100 mg daily, prednisone taper.  Infectious disease recommended patient should get dalbavancin 1 dose before discharge but IV infiltrated.  Patient did not want to wait and he has left.  Patient will follow-up with infectious diseases in 2 weeks.  Discharge Diagnoses:  Principal Problem:   Acute leg pain, left Active Problems:   AKI (acute kidney injury) ([place])   Hypokalemia   Essential hypertension   Type 2 diabetes mellitus with complication, without long-term current use of insulin (HCC)   HIV (human immunodeficiency virus infection) ([place])  Acute Left leg pain: Suspected gouty arthritis Patient presented with 2-day history of left foot pain and swelling that has now progressed up to the knee.   The entire affected area is swollen with significant tenderness to palpation, however there is no evidence of skin breakdown, erythema, rash or ulceration.   Patient denies any recent URI symptoms.   Differential includes both infectious and noninfectious etiology including myositis, cellulitis, polyarticular gouty arthritis.  Infectious diseases consulted, recommended possible arthrocentesis. GC chlamydia negative, RPR+ Continue IV Rocephin 1 g daily. He was given prednisone 60 mg once MRI left foot: Findings mainly consistent with acute gouty arthritis. Orthopedics  consulted and s/p arthrocentesis.   Orthopedics signed off.  Recommended patient can be discharged on allopurinol and prednisone taper. Patient is being discharged on doxycycline 100 mg twice daily for 4 weeks. Infectious disease will follow-up on cultures.   Acute kidney injury: Creatinine is elevated at 1.4, however previously 1.7.    Serum  creatinine has improved.  Back to normal   HIV: Last viral load negative. Continue Biktarvy   Type 2 diabetes mellitus: Currently diet controlled.   Last A1c  within goal at 6.5% SSI, sensitive   Essential hypertension Continue home antihypertensives.   Hypokalemia: Replaced and resolved.   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   Call MD for:  difficulty breathing, headache or visual disturbances   Complete by: As directed    Call MD for:  persistant dizziness or light-headedness   Complete by: As directed    Call MD for:  persistant nausea and vomiting   Complete by: As directed    Diet - low sodium heart healthy   Complete by: As directed    Diet - low sodium heart healthy   Complete by: As directed    Diet Carb Modified   Complete by: As directed    Discharge instructions   Complete by: As directed    Advised to follow-up with primary care physician in 1 week. Advised to follow-up with infectious disease doctor as scheduled. Advised to take doxycycline 100 mg twice daily for 28 days. Patient has received a dose of dalbavancin while in hospital. Advised to take allopurinol 100 mg daily for gouty arthritis. Advised to take prednisone taper as prescribed.   Increase activity slowly   Complete by: As directed    Increase activity slowly   Complete by: As directed       Allergies as of 09/25/2022       Reactions   Levaquin [levofloxacin] Itching, Rash        Medication List     STOP taking these medications    colchicine 0.6 MG tablet       TAKE these medications    albuterol 108 (90 Base) MCG/ACT inhaler Commonly known as: VENTOLIN HFA Inhale 2 puffs into the lungs every 6 (six) hours as needed for wheezing or shortness of breath.   allopurinol 100 MG tablet Commonly known as: Zyloprim Take 1 tablet (100 mg total) by mouth daily.   bictegravir-emtricitabine-tenofovir AF 50-200-25 MG Tabs tablet Commonly known as: BIKTARVY Take 1  tablet by mouth daily.   doxycycline 100 MG tablet Commonly known as: VIBRA-TABS Take 1 tablet (100 mg total) by mouth every 12 (twelve) hours for 28 days.   HYDROcodone-acetaminophen 5-325 MG tablet Commonly known as: NORCO/VICODIN Take 1 tablet by mouth every 6 (six) hours as needed for up to 5 days for moderate pain or severe pain.   ipratropium-albuterol 0.5-2.5 (3) MG/3ML Soln Commonly known as: DUONEB Take 3 mLs by nebulization every 4 (four) hours as needed.   lisinopril-hydrochlorothiazide 20-25 MG tablet Commonly known as: ZESTORETIC Take by mouth.   predniSONE 10 MG (21) Tbpk tablet Commonly known as: STERAPRED UNI-PAK 21 TAB Take 6 pills on day one then decrease by 1 pill each day               Durable Medical Equipment  (From admission, onward)           Start     Ordered   09/25/22 1404  For home use only DME Walker rolling  Once       Question Answer Comment  Walker: With [place]   Patient needs a walker to treat with the following  condition Knee pain      09/25/22 1403            Follow-up Information     [name], NP Follow up in 1 week(s).   Specialty: Adult Health Nurse Practitioner Contact information: [address] [place] [address] [phone]                Allergies  Allergen Reactions   Levaquin [Levofloxacin] Itching and Rash    Consultations: Orthopedics Infectious diseases   Procedures/Studies: MR FOOT LEFT W WO CONTRAST  Result Date: 09/23/2022 CLINICAL DATA:  Acute onset pain and swelling. EXAM: MRI OF THE LEFT FOREFOOT WITHOUT AND WITH CONTRAST TECHNIQUE: Multiplanar, multisequence MR imaging of the left foot was performed both before and after administration of intravenous contrast. CONTRAST:  27m GADAVIST GADOBUTROL 1 MMOL/ML IV SOLN COMPARISON:  Radiographs 09/23/2022 FINDINGS: Evidence of a significant inflammatory arthropathy involving the first MTP joint. There is a large joint  effusion, enhancing synovitis and a large medial erosion involving the first metatarsal head away from the joint. There is some surrounding soft tissue swelling/edema. Findings are most typical for gouty arthritis. There are also joint effusions involving the other MTP joints but no definite erosive findings. Degenerative changes involving the third tarsal metatarsal joint and also the cuboid joint articulating with the fourth and fifth metatarsals. Joint effusions, subchondral cystic change and marrow edema. Diffuse subcutaneous soft tissue swelling/edema/fluid most notably involving the dorsum of the foot but no soft tissue abscess. Mild myofasciitis without findings for pyomyositis. The major tendons and ligaments appear intact. IMPRESSION: 1. Evidence of a significant inflammatory arthropathy involving the first MTP joint. There is a large joint effusion, enhancing synovitis and a large medial erosion involving the first metatarsal head away from the joint. Septic arthritis cannot be totally excluded by imaging but these findings are most typical for gouty arthritis. 2. Arthropathic changes at the tarsal metatarsal joints also as detailed above. 3. Diffuse subcutaneous soft tissue swelling/edema/fluid most notably involving the dorsum of the foot but no soft tissue abscess. 4. Mild myofasciitis without findings for pyomyositis. Electronically Signed   By: PMarijo SanesM.D.   On: 09/23/2022 19:27   DG Tibia/Fibula Left Port  Result Date: 09/23/2022 CLINICAL DATA:  Foot pain and swelling EXAM: PORTABLE LEFT TIBIA AND FIBULA - 2 VIEW COMPARISON:  None Available. FINDINGS: No fracture or dislocation. Preserved joint spaces and bone mineralization. No definite erosive change IMPRESSION: No acute osseous abnormality Electronically Signed   By: AJill SideM.D.   On: 09/23/2022 14:05   DG Foot Complete Left  Result Date: 09/23/2022 CLINICAL DATA:  Diabetic with swelling of left foot. EXAM: LEFT FOOT -  COMPLETE 3+ VIEW COMPARISON:  None Available. FINDINGS: There is diffuse soft tissue edema about foot. Lateral subluxation of the fifth DIP joint is identified. No sign acute fracture or dislocation. No focal bone erosions. Formal IMPRESSION: 1. Diffuse soft tissue edema. 2. No signs of acute fracture or dislocation. No focal bone erosions identified to suggest acute osteomyelitis. 3. Lateral subluxation of the fifth DIP joint, which may be a chronic finding. Electronically Signed   By: TKerby MoorsM.D.   On: 09/23/2022 14:05   DG Knee 1-2 Views Left  Result Date: 09/23/2022 CLINICAL DATA:  Diabetes.  Swelling. EXAM: LEFT KNEE - 1-2 VIEW COMPARISON:  None. FINDINGS: Limited two views study shows no evidence for an acute fracture or dislocation at the knee. Probable joint effusion although assessment limited by  positioning. No worrisome lytic or sclerotic osseous abnormality. IMPRESSION: Negative. Electronically Signed   By: Misty Stanley M.D.   On: 09/23/2022 14:04   US Venous Img Lower Unilateral Left (DVT)  Result Date: 09/23/2022 CLINICAL DATA:  Pain and swelling of left lower leg EXAM: LEFT LOWER EXTREMITY VENOUS DOPPLER ULTRASOUND TECHNIQUE: Gray-scale sonography with compression, as well as color and duplex ultrasound, were performed to evaluate the deep venous system(s) from the level of the common femoral vein through the popliteal and proximal calf veins. COMPARISON:  None Available. FINDINGS: VENOUS Normal compressibility of the common femoral, superficial femoral, and popliteal veins, as well as the visualized calf veins. Visualized portions of profunda femoral vein and great saphenous vein unremarkable. No filling defects to suggest DVT on grayscale or color Doppler imaging. Doppler waveforms show normal direction of venous flow, normal respiratory plasticity and response to augmentation. Limited views of the contralateral common femoral vein are unremarkable. Small 1.5 cm fluid collection  within the left popliteal fossa, likely a Baker's cyst. IMPRESSION: No evidence of DVT in the left lower extremity. Electronically Signed   By: Margaretha Sheffield M.D.   On: 09/23/2022 11:06   DG Chest 2 View  Result Date: 08/30/2022 CLINICAL DATA:  Chest pain and shortness of breath EXAM: CHEST - 2 VIEW COMPARISON:  Chest x-ray 03/13/2022 FINDINGS: The heart size and mediastinal contours are within normal limits. Both lungs are clear. The visualized skeletal structures are unremarkable. IMPRESSION: No active cardiopulmonary disease. Electronically Signed   By: Ronney Asters M.D.   On: 08/30/2022 16:43     Subjective: Patient was seen and examined at bedside.  Overnight events noted.   Patient reports doing much better,  reports left knee pain has significantly improved.   He wants to be discharged.  Discharge Exam: Vitals:   09/25/22 0541 09/25/22 0743  BP: (!) 145/90 (!) 111/94  Pulse: 79 80  Resp: 18 17  Temp: 98 F (36.7 C) 97.8 F (36.6 C)  SpO2: 100% 100%   Vitals:   09/24/22 1644 09/24/22 2013 09/25/22 0541 09/25/22 0743  BP: (!) 153/87 (!) 145/90 (!) 145/90 (!) 111/94  Pulse: 100 93 79 80  Resp: 16 20 18 17  $ Temp: 97.8 F (36.6 C) 99 F (37.2 C) 98 F (36.7 C) 97.8 F (36.6 C)  TempSrc:      SpO2: 100% 100% 100% 100%  Weight:      Height:        General: Pt is alert, awake, not in acute distress Cardiovascular: RRR, S1/S2 +, no rubs, no gallops Respiratory: CTA bilaterally, no wheezing, no rhonchi Abdominal: Soft, NT, ND, bowel sounds + Extremities: no edema, no cyanosis    The results of significant diagnostics from this hospitalization (including imaging, microbiology, ancillary and laboratory) are listed below for reference.     Microbiology: Recent Results (from the past 240 hour(s))  Culture, blood (Routine X 2) w Reflex to ID Panel     Status: None (Preliminary result)   Collection Time: 09/23/22  4:32 PM   Specimen: Left Antecubital; Blood  Result  Value Ref Range Status   Specimen Description LEFT ANTECUBITAL  Final   Special Requests   Final    BOTTLES DRAWN AEROBIC AND ANAEROBIC Blood Culture adequate volume   Culture   Final    NO GROWTH 2 DAYS Performed at [facility], [address], [place], [place] [address]    Report Status PENDING  Incomplete  Culture, blood (Routine X  2) w Reflex to ID Panel     Status: None (Preliminary result)   Collection Time: 09/23/22  4:40 PM   Specimen: Right Antecubital; Blood  Result Value Ref Range Status   Specimen Description RIGHT ANTECUBITAL  Final   Special Requests   Final    BOTTLES DRAWN AEROBIC AND ANAEROBIC Blood Culture adequate volume   Culture   Final    NO GROWTH 2 DAYS Performed at [facility], [address] [place], [place] [address]    Report Status PENDING  Incomplete  Body fluid culture w Gram Stain     Status: None (Preliminary result)   Collection Time: 09/23/22  6:49 PM   Specimen: Body Fluid  Result Value Ref Range Status   Specimen Description   Final    FLUID Performed at [facility], [address] [place], [place] [address]    Special Requests   Final    [facility] KNEE Performed at [facility], [address] [place], [place] [address]    Gram Stain   Final    FEW WBC PRESENT, PREDOMINANTLY PMN NO ORGANISMS SEEN    Culture   Final    NO GROWTH 1 DAY Performed at [facility] [address] [place], [place] [address]    Report Status PENDING  Incomplete  Chlamydia/NGC rt PCR ([place] only)     Status: None   Collection Time: 09/24/22  6:00 AM   Specimen: Urine  Result Value Ref Range Status   Specimen source GC/Chlam URINE, RANDOM  Final   Chlamydia Tr NOT DETECTED NOT DETECTED Final   N gonorrhoeae NOT DETECTED NOT DETECTED Final    Comment: (NOTE) This CT/NG assay has not been evaluated in patients with a history of  hysterectomy. Performed at [facility], [address]  [place], [place] [address]      Labs: BNP (last 3 results) Recent Labs    03/13/22 1630 08/30/22 1617 09/23/22 0735  BNP 15.6 13.2 [id number]   Basic Metabolic Panel: Recent Labs  Lab 09/23/22 0735 09/24/22 0626 09/25/22 0603  NA 133* 138 135  K 3.0* 3.6 4.0  CL 99 103 102  CO2 27 28 27  $ GLUCOSE 121* 138* 247*  BUN 12 16 18  $ CREATININE 1.40* 1.22 1.13  CALCIUM 8.3* 8.6* 8.7*  MG  --   --  2.6*  PHOS  --   --  3.3   Liver Function Tests: Recent Labs  Lab 09/23/22 0735  AST 20  ALT 27  ALKPHOS 34*  BILITOT 0.9  PROT 8.1  ALBUMIN 3.7   No results for input(s): "LIPASE", "AMYLASE" in the last 168 hours. No results for input(s): "AMMONIA" in the last 168 hours. CBC: Recent Labs  Lab 09/23/22 0735 09/24/22 0626 09/25/22 0603  WBC 11.3* 12.4* 9.3  NEUTROABS 7.0 8.2*  --   HGB 12.2* 12.4* 12.6*  HCT 36.4* 37.9* 38.7*  MCV 87.3 88.6 88.0  PLT 316 345 374   Cardiac Enzymes: Recent Labs  Lab 09/23/22 0735  CKTOTAL 348   BNP: Invalid input(s): "POCBNP" CBG: Recent Labs  Lab 09/24/22 1143 09/24/22 1656 09/24/22 2012 09/25/22 0805 09/25/22 1157  GLUCAP 168* 223* 237* 210* 263*   D-Dimer No results for input(s): "DDIMER" in the last 72 hours. Hgb A1c Recent Labs    09/23/22 1632  HGBA1C 6.5*   Lipid Profile No results for input(s): "CHOL", "HDL", "LDLCALC", "TRIG", "CHOLHDL", "LDLDIRECT" in the last 72 hours. Thyroid  function studies Recent Labs    09/23/22 0735  TSH 0.302*   Anemia work up No results for input(s): "VITAMINB12", "FOLATE", "FERRITIN", "TIBC", "IRON", "RETICCTPCT" in the last 72 hours. Urinalysis    Component Value Date/Time   COLORURINE AMBER (A) 02/14/2022 1529   APPEARANCEUR CLOUDY (A) 02/14/2022 1529   LABSPEC 1.019 02/14/2022 1529   PHURINE 5.0 02/14/2022 1529   GLUCOSEU NEGATIVE 02/14/2022 1529   HGBUR LARGE (A) 02/14/2022 1529   BILIRUBINUR NEGATIVE 02/14/2022 1529   KETONESUR NEGATIVE 02/14/2022 1529   PROTEINUR 30  (A) 02/14/2022 1529   NITRITE NEGATIVE 02/14/2022 1529   LEUKOCYTESUR NEGATIVE 02/14/2022 1529   Sepsis Labs Recent Labs  Lab 09/23/22 0735 09/24/22 0626 09/25/22 0603  WBC 11.3* 12.4* 9.3   Microbiology Recent Results (from the past 240 hour(s))  Culture, blood (Routine X 2) w Reflex to ID Panel     Status: None (Preliminary result)   Collection Time: 09/23/22  4:32 PM   Specimen: Left Antecubital; Blood  Result Value Ref Range Status   Specimen Description LEFT ANTECUBITAL  Final   Special Requests   Final    BOTTLES DRAWN AEROBIC AND ANAEROBIC Blood Culture adequate volume   Culture   Final    NO GROWTH 2 DAYS Performed at [facility], [address], [place], [place] [address]    Report Status PENDING  Incomplete  Culture, blood (Routine X 2) w Reflex to ID Panel     Status: None (Preliminary result)   Collection Time: 09/23/22  4:40 PM   Specimen: Right Antecubital; Blood  Result Value Ref Range Status   Specimen Description RIGHT ANTECUBITAL  Final   Special Requests   Final    BOTTLES DRAWN AEROBIC AND ANAEROBIC Blood Culture adequate volume   Culture   Final    NO GROWTH 2 DAYS Performed at [facility], [address] [place], [place] [address]    Report Status PENDING  Incomplete  Body fluid culture w Gram Stain     Status: None (Preliminary result)   Collection Time: 09/23/22  6:49 PM   Specimen: Body Fluid  Result Value Ref Range Status   Specimen Description   Final    FLUID Performed at [facility], [address] [place], [place] [address]    Special Requests   Final    [facility] KNEE Performed at [facility], [address] [place], [place] [address]    Gram Stain   Final    FEW WBC PRESENT, PREDOMINANTLY PMN NO ORGANISMS SEEN    Culture   Final    NO GROWTH 1 DAY Performed at [facility] [address] [place], [place] [address]    Report Status PENDING  Incomplete  Chlamydia/NGC rt PCR  ([place] only)     Status: None   Collection Time: 09/24/22  6:00 AM   Specimen: Urine  Result Value Ref Range Status   Specimen source GC/Chlam URINE, RANDOM  Final   Chlamydia Tr NOT DETECTED NOT DETECTED Final   N gonorrhoeae NOT DETECTED NOT DETECTED Final    Comment: (NOTE) This CT/NG assay has not been evaluated in patients with a history of  hysterectomy. Performed at [facility], [address] [place],  [address]      Time coordinating discharge: Over 30 minutes  SIGNED:   Shawna Clamp, MD  [facility] 09/25/2022, 3:11 PM Pager   If 7PM-7AM, please contact night-coverage

## 2022-09-25 NOTE — Progress Notes (Signed)
Mobility Specialist - Progress Note   09/25/22 0904  Mobility  Activity Ambulated independently in hallway  Level of Assistance Independent  Assistive Device None  Distance Ambulated (ft) 320 ft  Activity Response Tolerated well  Mobility Referral Yes  $Mobility charge 1 Mobility   Pt sitting in recliner on RA upon arrival. Pt STS and ambulates 2 laps around NS indep. Pt left in recliner with needs in reach and MD in room.   Gretchen Short  Mobility Specialist  09/25/22 9:05 AM

## 2022-09-25 NOTE — Progress Notes (Addendum)
ID PROGRESS NOTE   Patient is improving after arthrocentesis and steroid injection. cultures NGTD at day 1.   Fluid count showed 21K, 92%N. Gram stain negative, no crystals  Patient wanting to leave and follow up as outpatient   Recommend 4 wk of doxy 123m po bid. Will give a dose of dalbavancin prior to discharge  Will call back should cultures return.  CElzie RingsSNew Castle Northwestfor Infectious Diseases [phone]

## 2022-09-26 LAB — GONOCOCCUS CULTURE: Culture: NO GROWTH

## 2022-09-27 LAB — BODY FLUID CULTURE W GRAM STAIN: Culture: NO GROWTH

## 2022-09-27 LAB — T.PALLIDUM AB, TOTAL: T Pallidum Abs: REACTIVE — AB

## 2022-09-29 DIAGNOSIS — E119 Type 2 diabetes mellitus without complications: Secondary | ICD-10-CM | POA: Diagnosis not present

## 2022-09-29 DIAGNOSIS — L03119 Cellulitis of unspecified part of limb: Secondary | ICD-10-CM | POA: Diagnosis not present

## 2022-09-29 LAB — CULTURE, BLOOD (ROUTINE X 2)
Culture: NO GROWTH
Culture: NO GROWTH
Special Requests: ADEQUATE
Special Requests: ADEQUATE

## 2022-10-05 DIAGNOSIS — R7989 Other specified abnormal findings of blood chemistry: Secondary | ICD-10-CM | POA: Diagnosis not present

## 2022-10-20 ENCOUNTER — Other Ambulatory Visit: Payer: Self-pay | Admitting: Internal Medicine

## 2022-10-20 DIAGNOSIS — B20 Human immunodeficiency virus [HIV] disease: Secondary | ICD-10-CM

## 2022-10-20 NOTE — Telephone Encounter (Signed)
Appt 3/19

## 2022-10-21 ENCOUNTER — Encounter: Payer: Self-pay | Admitting: Emergency Medicine

## 2022-10-21 ENCOUNTER — Emergency Department
Admission: EM | Admit: 2022-10-21 | Discharge: 2022-10-21 | Disposition: A | Discharge status: Home / Self Care | Payer: Medicaid Other | Attending: Emergency Medicine | Admitting: Emergency Medicine

## 2022-10-21 ENCOUNTER — Emergency Department: Payer: Medicaid Other

## 2022-10-21 ENCOUNTER — Other Ambulatory Visit: Payer: Self-pay

## 2022-10-21 DIAGNOSIS — D72829 Elevated white blood cell count, unspecified: Secondary | ICD-10-CM | POA: Diagnosis not present

## 2022-10-21 DIAGNOSIS — M25572 Pain in left ankle and joints of left foot: Secondary | ICD-10-CM | POA: Diagnosis not present

## 2022-10-21 DIAGNOSIS — R944 Abnormal results of kidney function studies: Secondary | ICD-10-CM | POA: Diagnosis not present

## 2022-10-21 DIAGNOSIS — Z21 Asymptomatic human immunodeficiency virus [HIV] infection status: Secondary | ICD-10-CM | POA: Insufficient documentation

## 2022-10-21 DIAGNOSIS — E119 Type 2 diabetes mellitus without complications: Secondary | ICD-10-CM | POA: Insufficient documentation

## 2022-10-21 DIAGNOSIS — J45909 Unspecified asthma, uncomplicated: Secondary | ICD-10-CM | POA: Diagnosis not present

## 2022-10-21 DIAGNOSIS — I10 Essential (primary) hypertension: Secondary | ICD-10-CM | POA: Insufficient documentation

## 2022-10-21 DIAGNOSIS — M7989 Other specified soft tissue disorders: Secondary | ICD-10-CM | POA: Diagnosis not present

## 2022-10-21 LAB — BASIC METABOLIC PANEL
Anion gap: 11 (ref 5–15)
BUN: 17 mg/dL (ref 6–20)
CO2: 29 mmol/L (ref 22–32)
Calcium: 9.4 mg/dL (ref 8.9–10.3)
Chloride: 101 mmol/L (ref 98–111)
Creatinine, Ser: 1.23 mg/dL (ref 0.61–1.24)
GFR, Estimated: >60 mL/min (ref >60)
Glucose, Bld: 104 mg/dL — ABNORMAL HIGH (ref 70–99)
Potassium: 3.2 mmol/L — ABNORMAL LOW (ref 3.5–5.1)
Sodium: 141 mmol/L (ref 135–145)

## 2022-10-21 LAB — CBC WITH DIFFERENTIAL/PLATELET
Abs Immature Granulocytes: 0.02 10*3/uL (ref 0.00–0.07)
Basophils Absolute: 0.1 10*3/uL (ref 0.0–0.1)
Basophils Relative: 1 %
Eosinophils Absolute: 0.2 10*3/uL (ref 0.0–0.5)
Eosinophils Relative: 4 %
HCT: 41.3 % (ref 39.0–52.0)
Hemoglobin: 13.2 g/dL (ref 13.0–17.0)
Immature Granulocytes: 0 %
Lymphocytes Relative: 33 %
Lymphs Abs: 1.9 10*3/uL (ref 0.7–4.0)
MCH: 28.9 pg (ref 26.0–34.0)
MCHC: 32 g/dL (ref 30.0–36.0)
MCV: 90.6 fL (ref 80.0–100.0)
Monocytes Absolute: 0.7 10*3/uL (ref 0.1–1.0)
Monocytes Relative: 12 %
Neutro Abs: 2.9 10*3/uL (ref 1.7–7.7)
Neutrophils Relative %: 50 %
Platelets: 269 10*3/uL (ref 150–400)
RBC: 4.56 MIL/uL (ref 4.22–5.81)
RDW: 14.2 % (ref 11.5–15.5)
WBC: 5.9 10*3/uL (ref 4.0–10.5)
nRBC: 0 % (ref 0.0–0.2)

## 2022-10-21 LAB — URIC ACID: Uric Acid, Serum: 7.7 mg/dL (ref 3.7–8.6)

## 2022-10-21 MED ORDER — METHYLPREDNISOLONE SODIUM SUCC 40 MG IJ SOLR
40.0000 mg | Freq: Once | INTRAMUSCULAR | Status: AC
  Administered 2022-10-21: 40 mg via INTRAVENOUS
  Filled 2022-10-21: qty 1

## 2022-10-21 MED ORDER — PREDNISONE 50 MG PO TABS: ORAL_TABLET | ORAL | 0 refills | Status: DC

## 2022-10-21 NOTE — ED Provider Notes (Signed)
[facility] Provider Note    Event Date/Time   First MD Initiated Contact with Patient 10/21/22 [phone]     (approximate)   History   Leg Swelling   HPI  Aaron [name] is a 47 y.o. male with a past medical history of HIV, obesity, diabetes, gout who presents today for evaluation of left foot and ankle pain.  He reports that this is a recurrent problem for him.  Most recently happened 1 month ago.  Patient reports that he ate meat last night.  He denies any fevers or chills.  He has not noticed any skin color changes.  He reports that he is compliant with his antiretrovirals, reports that his viral load is undetectable.  He has been able to ambulate.  Patient Active Problem List   Diagnosis Date Noted   Acute leg pain, left 09/23/2022   Encounter for screening colonoscopy 09/21/2022   Positive RPR test 02/16/2022   HIV (human immunodeficiency virus infection) ([place]) 02/16/2022   Sepsis due to Escherichia coli (E. coli) (HCC) 02/15/2022   Obesity (BMI 30-39.9) 02/15/2022   Acute colitis 02/14/2022   Hypokalemia 02/14/2022   Essential hypertension 02/14/2022   AKI (acute kidney injury) ([place]) 02/14/2022   Asthma, chronic 02/14/2022   Type 2 diabetes mellitus with complication, without long-term current use of insulin ([place]) 02/14/2022   Angioedema 04/20/2016          Physical Exam   Triage Vital Signs: ED Triage Vitals  Enc Vitals Group     BP 10/21/22 0719 (!) 153/112     Pulse Rate 10/21/22 0719 94     Resp 10/21/22 0719 18     Temp 10/21/22 0719 97.8 F (36.6 C)     Temp Source 10/21/22 0719 Oral     SpO2 10/21/22 0719 95 %     Weight --      Height --      Head Circumference --      Peak Flow --      Pain Score 10/21/22 0718 8     Pain Loc --      Pain Edu? --      Excl. in [place]? --     Most recent vital signs: Vitals:   10/21/22 0719  BP: (!) 153/112  Pulse: 94  Resp: 18  Temp: 97.8 F (36.6 C)  SpO2: 95%    Physical  Exam Vitals and nursing note reviewed.  Constitutional:      General: Awake and alert. No acute distress.    Appearance: Normal appearance. The patient is normal weight.  HENT:     Head: Normocephalic and atraumatic.     Mouth: Mucous membranes are moist.  Eyes:     General: PERRL. Normal EOMs        Right eye: No discharge.        Left eye: No discharge.     Conjunctiva/sclera: Conjunctivae normal.  Cardiovascular:     Rate and Rhythm: Normal rate and regular rhythm.     Pulses: Normal pulses.  Pulmonary:     Effort: Pulmonary effort is normal. No respiratory distress.     Breath sounds: Normal breath sounds.  Abdominal:     Abdomen is soft. There is no abdominal tenderness. No rebound or guarding. No distention. Musculoskeletal:        General: No swelling. Normal range of motion.     Cervical back: Normal range of motion and neck supple.  Left lower extremity:  Soft tissue swelling and tenderness noted to the dorsum of left foot.  Normal 2+ pedal pulses.  Sensation intact light touch throughout.  Mild tenderness to the plantar aspect of the foot.  Able to plantarflex and dorsiflex against resistance.  No pitting edema to his shin/calf.  No calf tenderness.  No wounds.  No warmth.  No erythema.  No lymphangitis.  No wounds. Skin:    General: Skin is warm and dry.     Capillary Refill: Capillary refill takes less than 2 seconds.     Findings: No rash.  Neurological:     Mental Status: The patient is awake and alert.      ED Results / Procedures / Treatments   Labs (all labs ordered are listed, but only abnormal results are displayed) Labs Reviewed  BASIC METABOLIC PANEL - Abnormal; Notable for the following components:      Result Value   Potassium 3.2 (*)    Glucose, Bld 104 (*)    All other components within normal limits  CBC WITH DIFFERENTIAL/PLATELET  URIC ACID     EKG     RADIOLOGY I independently reviewed and interpreted imaging and agree with  radiologists findings.     PROCEDURES:  Critical Care performed:   Procedures   MEDICATIONS ORDERED IN ED: Medications  methylPREDNISolone sodium succinate (SOLU-MEDROL) 40 mg/mL injection 40 mg (40 mg Intravenous Given 10/21/22 0830)     IMPRESSION / MDM / ASSESSMENT AND PLAN / ED COURSE  I reviewed the triage vital signs and the nursing notes.   Differential diagnosis includes, but is not limited to, gout, cellulitis, fracture, DVT.  Patient is awake and alert, hemodynamically stable and afebrile.  He is neurovascularly intact.  He is soft tissue swelling noted diffusely to his foot, however there is no erythema, warmth, crepitus, lymphangitis, wounds, or fever to suggest infectious etiology.  Further workup is indicated.  Labs were obtained which were overall quite reassuring.  No leukocytosis.  He has normal distal pulses.  Normal capillary refill.  Do not suspect vascular injury.  X-rays of his foot and ankle demonstrate no acute bony injury.  Ultrasound obtained from triage is negative for DVT.  I reviewed the patient's chart.  Patient was seen in the emergency department on 09/23/2022 and admitted to the hospital until 09/25/2022.  He presented for evaluation of left foot pain and swelling which increased and progressed to involve his ankle and calf and then his left knee.  He had a leukocytosis to 11.3, serum creatinine of 1.4, and elevated uric acid to 9.5.  He had a venous duplex ultrasound which was negative for DVT.  He was treated with prednisone and Rocephin and admitted.  Both orthopedics and infectious diseases were consulted for suspicion of septic arthritis.  He had a negative GC chlamydia.  He underwent arthrocentesis which revealed no crystals, however MRI of his left foot demonstrated findings consistent with acute gouty arthritis.  He felt significantly improved after Kenalog injection.  Infectious disease consulted recommended patient could be discharged on doxycycline  100 mg twice daily, allopurinol, and a prednisone taper.  Today, symptoms are isolated to the foot only.  No knee involvement.  Patient reports that this has been feeling exactly the same as when he was hospitalized last month.  He reports that he is compliant with the medications that he was discharged on, and is still taking the doxycycline.  He was treated with Solu-Medrol with significant improvement of both his pain and his  swelling.  He was started on an additional short course of prednisone for possible gout.  Considered indomethacin given that his kidney function is within reasonable limits, however this is interactive with his antiretroviral therapy.  We discussed strict return precautions and the importance of close outpatient follow-up.  He was given the information for the orthopedist who saw him while hospitalized for continuation of care.  He understands return precautions in the meantime.  Patient was discharged in stable condition.  Patient's presentation is most consistent with acute complicated illness / injury requiring diagnostic workup.  Clinical Course as of 10/21/22 1244  Fri Oct 21, 2022  0935 Patient reports that his pain is significantly improved [JP]    Clinical Course User Index [JP] Isadore Palecek, Clarnce Flock, PA-C     FINAL CLINICAL IMPRESSION(S) / ED DIAGNOSES   Final diagnoses:  Acute left ankle pain     Rx / DC Orders   ED Discharge Orders          Ordered    predniSONE (DELTASONE) 50 MG tablet        10/21/22 [id number]             Note:  This document was prepared using Dragon voice recognition software and may include unintentional dictation errors.   Emeline Gins 10/21/22 1244    Carrie Mew, MD 10/21/22 [phone]

## 2022-10-21 NOTE — Discharge Instructions (Signed)
Please take the medication as prescribed.  Please follow-up with orthopedics this week.  Please return for any new, worsening, or change in symptoms or other concerns including redness, warmth, fevers, worsening pain, worsening swelling, or any other concerns.  Was a pleasure caring for you today.

## 2022-10-21 NOTE — ED Triage Notes (Signed)
Patient to ED for left leg swelling. Patient states he has been swollen x1 day and sore/tender to touch. Denies hx of blood clots. Denies injury

## 2022-10-25 ENCOUNTER — Ambulatory Visit: Payer: Medicaid Other | Admitting: Internal Medicine

## 2022-11-25 ENCOUNTER — Other Ambulatory Visit: Payer: Self-pay | Admitting: Internal Medicine

## 2022-11-25 DIAGNOSIS — B20 Human immunodeficiency virus [HIV] disease: Secondary | ICD-10-CM

## 2022-12-21 ENCOUNTER — Other Ambulatory Visit: Payer: Self-pay

## 2022-12-21 ENCOUNTER — Emergency Department
Admission: EM | Admit: 2022-12-21 | Discharge: 2022-12-21 | Disposition: A | Discharge status: Home / Self Care | Payer: Medicaid Other | Attending: Emergency Medicine | Admitting: Emergency Medicine

## 2022-12-21 ENCOUNTER — Encounter: Payer: Self-pay | Admitting: Emergency Medicine

## 2022-12-21 DIAGNOSIS — E119 Type 2 diabetes mellitus without complications: Secondary | ICD-10-CM | POA: Insufficient documentation

## 2022-12-21 DIAGNOSIS — I10 Essential (primary) hypertension: Secondary | ICD-10-CM | POA: Diagnosis not present

## 2022-12-21 DIAGNOSIS — M109 Gout, unspecified: Secondary | ICD-10-CM | POA: Diagnosis not present

## 2022-12-21 DIAGNOSIS — J45909 Unspecified asthma, uncomplicated: Secondary | ICD-10-CM | POA: Insufficient documentation

## 2022-12-21 DIAGNOSIS — R609 Edema, unspecified: Secondary | ICD-10-CM | POA: Diagnosis not present

## 2022-12-21 DIAGNOSIS — M25561 Pain in right knee: Secondary | ICD-10-CM | POA: Diagnosis not present

## 2022-12-21 DIAGNOSIS — Z743 Need for continuous supervision: Secondary | ICD-10-CM | POA: Diagnosis not present

## 2022-12-21 LAB — BASIC METABOLIC PANEL
Anion gap: 8 (ref 5–15)
BUN: 12 mg/dL (ref 6–20)
CO2: 26 mmol/L (ref 22–32)
Calcium: 9 mg/dL (ref 8.9–10.3)
Chloride: 100 mmol/L (ref 98–111)
Creatinine, Ser: 1.16 mg/dL (ref 0.61–1.24)
GFR, Estimated: >60 mL/min (ref >60)
Glucose, Bld: 158 mg/dL — ABNORMAL HIGH (ref 70–99)
Potassium: 3.4 mmol/L — ABNORMAL LOW (ref 3.5–5.1)
Sodium: 134 mmol/L — ABNORMAL LOW (ref 135–145)

## 2022-12-21 LAB — CBC
HCT: 44.4 % (ref 39.0–52.0)
Hemoglobin: 14.5 g/dL (ref 13.0–17.0)
MCH: 29.2 pg (ref 26.0–34.0)
MCHC: 32.7 g/dL (ref 30.0–36.0)
MCV: 89.3 fL (ref 80.0–100.0)
Platelets: 340 10*3/uL (ref 150–400)
RBC: 4.97 MIL/uL (ref 4.22–5.81)
RDW: 14.6 % (ref 11.5–15.5)
WBC: 9.2 10*3/uL (ref 4.0–10.5)
nRBC: 0 % (ref 0.0–0.2)

## 2022-12-21 LAB — URIC ACID: Uric Acid, Serum: 8.7 mg/dL — ABNORMAL HIGH (ref 3.7–8.6)

## 2022-12-21 MED ORDER — DEXAMETHASONE SODIUM PHOSPHATE 10 MG/ML IJ SOLN
10.0000 mg | Freq: Once | INTRAMUSCULAR | Status: AC
  Administered 2022-12-21: 10 mg via INTRAMUSCULAR
  Filled 2022-12-21: qty 1

## 2022-12-21 MED ORDER — KETOROLAC TROMETHAMINE 15 MG/ML IJ SOLN
15.0000 mg | Freq: Once | INTRAMUSCULAR | Status: AC
  Administered 2022-12-21: 15 mg via INTRAMUSCULAR
  Filled 2022-12-21: qty 1

## 2022-12-21 NOTE — ED Triage Notes (Signed)
Pt via EMS from home. Pt c/o bilateral knee pain and L elbow pain. Pt is HIV +. Pt has a hx of gout and states this does feel similar.  A&OX4 and NAD and NAD.

## 2022-12-21 NOTE — ED Provider Notes (Signed)
[facility] Emergency Department Provider Note     Event Date/Time   First MD Initiated Contact with Patient 12/21/22 1542     (approximate)   History   Joint Pain   HPI  Aaron [name] is a 47 y.o. male with a past medical history of hypertension, diabetes, asthma and gout who presents to the ED with complaint of generalized joint pain for the past week. Patient reports bilateral knee pain with radiation to his groin, and elbow pain. Patient reports his current symptoms being similar to previous gout attack episodes.  Patient reports pain is worsened with walking.  Treatment to date for symptoms include colchicine with no significant improvement.  No associated symptoms at this time.  He denies fever, chills, shortness of breath, chest pain and injury.     Physical Exam   Triage Vital Signs: ED Triage Vitals  Enc Vitals Group     BP 12/21/22 1508 (!) 148/91     Pulse Rate 12/21/22 1508 (!) 106     Resp 12/21/22 1508 20     Temp 12/21/22 1508 99.2 F (37.3 C)     Temp src --      SpO2 12/21/22 1508 98 %     Weight 12/21/22 1457 276 lb (125.2 kg)     Height 12/21/22 1457 6' (1.829 m)     Head Circumference --      Peak Flow --      Pain Score 12/21/22 1457 10     Pain Loc --      Pain Edu? --      Excl. in [place]? --     Most recent vital signs: Vitals:   12/21/22 1508  BP: (!) 148/91  Pulse: (!) 106  Resp: 20  Temp: 99.2 F (37.3 C)  SpO2: 98%   General: Alert and oriented. INAD.  Discomfort. Skin:  Warm, dry and intact. No rashes or lesions noted.     CV:  Good peripheral perfusion. RRR.  RESP:  Normal effort. LCTAB.  ABD:  No distention. Soft.   MSK:   Full active range of motion of all UE & LE. No erythematous, warm, or swollen joints.  NEURO: Sensation and motor function intact.  Gait is at baseline with assistance of a cane. OTHER:  Left knee appears edematous and tender to palpation.  No bogginess or abnormal skin changes  noted.   ED Results / Procedures / Treatments   Labs (all labs ordered are listed, but only abnormal results are displayed) Labs Reviewed  BASIC METABOLIC PANEL - Abnormal; Notable for the following components:      Result Value   Sodium 134 (*)    Potassium 3.4 (*)    Glucose, Bld 158 (*)    All other components within normal limits  URIC ACID - Abnormal; Notable for the following components:   Uric Acid, Serum 8.7 (*)    All other components within normal limits  CBC   RADIOLOGY Not indicated  No results found.   PROCEDURES:  Critical Care performed: No  Procedures   MEDICATIONS ORDERED IN ED: Medications  dexamethasone (DECADRON) injection 10 mg (10 mg Intramuscular Given 12/21/22 1658)  ketorolac (TORADOL) 15 MG/ML injection 15 mg (15 mg Intramuscular Given 12/21/22 1659)     IMPRESSION / MDM / ASSESSMENT AND PLAN / ED COURSE  I reviewed the triage vital signs and the nursing notes.  Differential diagnosis includes, but is not limited to, gout attack, septic joint arthritis, osteoarthritis, bursitis.  Patient's presentation is most consistent with acute illness / injury with system symptoms.  47 year old male presents to the ED with acute joint pain at multiple sites I suspect more than likely due to a gout flareup. See HPI.  BMP essentially within normal limits with the exception of an elevated uric acid which hires my suspicion of a gout attack on my differential.  I did consider septic arthritis given patient's extensive immunosuppressant history.  However, patient's physical exam is not clinically consistent with this diagnosis.  Also normal CBC decreases my suspicion.  I do not believe a joint aspiration is indicated at this time.  I will give the patient a Decadron and a Toradol injection in the ED for pain.  pain level currently is reported 10/10.   Reassessment: Patient is out of bed walking around on his cane and very eager to go  home.  He reports his pain level has improved quite a bit.  Pain 4/10.  He has set an appointment for the next day with his primary care physician to manage further gout medications.  I discussed with him because of this I will not need to send in a prescription.  Patient understood and agreed.  He is discharged in satisfactory and stable condition.  Patient's diagnosis is consistent with an acute gout attack.  Patient is instructed to follow-up with his primary care physician the following day for gout medication management. Patient is given ED precautions to return to the ED for any worsening or new symptoms.  Patient understands and is agreeable to assessment and treatment plan.    FINAL CLINICAL IMPRESSION(S) / ED DIAGNOSES   Final diagnoses:  Acute gout of multiple sites, unspecified cause     Rx / DC Orders   ED Discharge Orders     None        Note:  This document was prepared using Dragon voice recognition software and may include unintentional dictation errors.    Romeo Apple, Lanique Gonzalo A, PA-C 12/22/22 Josefa Half    Trinna Post, MD 12/24/22 [phone]

## 2022-12-22 ENCOUNTER — Encounter: Payer: Self-pay | Admitting: Emergency Medicine

## 2023-01-16 NOTE — Progress Notes (Signed)
  The ASCVD Risk score (Arnett DK, et al., 2019) failed to calculate for the following reasons:   Cannot find a previous HDL lab   Cannot find a previous total cholesterol lab  Sandie Ano, RN

## 2023-01-20 ENCOUNTER — Other Ambulatory Visit: Payer: Self-pay

## 2023-01-20 ENCOUNTER — Other Ambulatory Visit (HOSPITAL_COMMUNITY)
Admission: RE | Admit: 2023-01-20 | Discharge: 2023-01-20 | Disposition: A | Discharge status: Home / Self Care | Payer: 59 | Source: Ambulatory Visit | Attending: Internal Medicine | Admitting: Internal Medicine

## 2023-01-20 ENCOUNTER — Encounter: Payer: Self-pay | Admitting: Internal Medicine

## 2023-01-20 ENCOUNTER — Ambulatory Visit (INDEPENDENT_AMBULATORY_CARE_PROVIDER_SITE_OTHER): Payer: 59 | Admitting: Internal Medicine

## 2023-01-20 VITALS — BP 150/95 | HR 89 | Temp 97.5°F | Ht 73.0 in | Wt 285.0 lb

## 2023-01-20 DIAGNOSIS — E785 Hyperlipidemia, unspecified: Secondary | ICD-10-CM

## 2023-01-20 DIAGNOSIS — Z129 Encounter for screening for malignant neoplasm, site unspecified: Secondary | ICD-10-CM

## 2023-01-20 DIAGNOSIS — Z113 Encounter for screening for infections with a predominantly sexual mode of transmission: Secondary | ICD-10-CM | POA: Diagnosis not present

## 2023-01-20 DIAGNOSIS — B20 Human immunodeficiency virus [HIV] disease: Secondary | ICD-10-CM

## 2023-01-20 DIAGNOSIS — A749 Chlamydial infection, unspecified: Secondary | ICD-10-CM

## 2023-01-20 LAB — CBC WITH DIFFERENTIAL/PLATELET
Hemoglobin: 13.6 g/dL (ref 13.2–17.1)
Lymphs Abs: 2094 cells/uL (ref 850–3900)
MCV: 90.6 fL (ref 80.0–100.0)
Neutro Abs: 2819 cells/uL (ref 1500–7800)
Neutrophils Relative %: 48.6 %
RBC: 4.59 10*6/uL (ref 4.20–5.80)
RDW: 14.5 % (ref 11.0–15.0)
WBC: 5.8 10*3/uL (ref 3.8–10.8)

## 2023-01-20 MED ORDER — ATORVASTATIN CALCIUM 40 MG PO TABS
40.0000 mg | ORAL_TABLET | Freq: Every day | ORAL | 11 refills | Status: DC
Start: 2023-01-20 — End: 2023-04-13

## 2023-01-20 NOTE — Patient Instructions (Signed)
Let's see each other in 3 months to make sure you liver function is ok with the new statin   Continue biktarvy Take the statin (lipitor) at night every day to reduce risk heart attack/stroke

## 2023-01-20 NOTE — Progress Notes (Signed)
[facility]      Patient Active Problem List   Diagnosis Date Noted   Acute leg pain, left 09/23/2022   Encounter for screening colonoscopy 09/21/2022   Positive RPR test 02/16/2022   HIV (human immunodeficiency virus infection) (HCC) 02/16/2022   Sepsis due to Escherichia coli (E. coli) (HCC) 02/15/2022   Obesity (BMI 30-39.9) 02/15/2022   Acute colitis 02/14/2022   Hypokalemia 02/14/2022   Essential hypertension 02/14/2022   AKI (acute kidney injury) (HCC) 02/14/2022   Asthma, chronic 02/14/2022   Type 2 diabetes mellitus with complication, without long-term current use of insulin (HCC) 02/14/2022   Angioedema 04/20/2016   Cc -- ongoing hiv care   HPI: Aaron [name] is a 47 y.o. male here for ongoing hiv care   01/20/23 id clinic f/u Tolerating biktarvy well; no missed dose last 4 weeks  Social -- same partner as referred to the last visit  Offered std testing again and he'll do    06/15/22 id clinic f/u Patient didn't qualify for the merck 053 study as his viral load was too low He started on biktarvy since last visit. Tolerating it well. No missed dose last 4 weeks No complaint today   Social -- currently sexually active steady relationship with same partner 2 years; partner is seronegative. Condom.  Patient is not concerned about std today and just wants hiv testing   I initially saw him new patient intake on 04/28/22: ------------ He has been checking hiv every year, and only found out this year that he was positive (during hospital admission 02/2022 for gastroenteritis)  Risk - sex msm. No iv or in drug use.   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 Uses marijuanna "a lot." Smokes cigarette 1 pack per week since age 65 Lives a lone Born/raised in [place]; currently residing in [place] No prior outside of [place] travel; [place] prior travel to [place] but [place] No pets;  neighbors have chicken  Hobbies - fishing (his families have farm in [place])  No current concern with his health except having good appetite and gaining weight      Review of Systems: ROS All other ros negative      Past Medical History:  Diagnosis Date   Asthma    Diabetes mellitus without complication (HCC)    Hypertension     Social History   Tobacco Use   Smoking status: Former    Types: Cigarettes   Smokeless tobacco: Never   Tobacco comments:    Quit 10/07/22  Substance Use Topics   Alcohol use: Yes   Drug use: Yes    Types: Marijuana    No family history on file.  Allergies  Allergen Reactions   Levaquin [Levofloxacin] Itching and Rash    OBJECTIVE: Vitals:   01/20/23 0952  Weight: 285 lb (129.3 kg)  Height: 6\' 1"  (1.854 m)   Body mass index is 37.6 kg/m.   Physical Exam General/constitutional: no distress, pleasant, obese HEENT: Normocephalic, PER, Conj Clear, EOMI, Oropharynx clear Neck supple CV: rrr no mrg Lungs: clear to auscultation, normal respiratory effort Abd: Soft, Nontender Ext: no edema Skin: No Rash Neuro: nonfocal MSK: no peripheral joint swelling/tenderness/warmth; back spines nontender       Lab: Lab Results  Component Value Date   WBC 9.2 12/21/2022   HGB 14.5 12/21/2022   HCT 44.4 12/21/2022   MCV 89.3  12/21/2022   PLT 340 12/21/2022   Last metabolic panel Lab Results  Component Value Date   GLUCOSE 158 (H) 12/21/2022   NA 134 (L) 12/21/2022   K 3.4 (L) 12/21/2022   CL 100 12/21/2022   CO2 26 12/21/2022   BUN 12 12/21/2022   CREATININE 1.16 12/21/2022   GFRNONAA >60 12/21/2022   CALCIUM 9.0 12/21/2022   PHOS 3.3 09/25/2022   PROT 8.1 09/23/2022   ALBUMIN 3.7 09/23/2022   BILITOT 0.9 09/23/2022   ALKPHOS 34 (L) 09/23/2022   AST 20 09/23/2022   ALT 27 09/23/2022   ANIONGAP 8 12/21/2022    HIV: 06/2022     ud 04/2022               /      885  (46%) 02/2022      110    /       Microbiology:  Serology: 02/2022 rpr titer 32  Imaging:   Assessment/plan: Problem List Items Addressed This Visit   None Visit Diagnoses     HIV disease (HCC)    -  Primary   Relevant Orders   T-helper cells (CD4) count (not at [facility])   HIV-1 RNA quant-no reflex-bld   CBC with Differential/Platelet   COMPLETE METABOLIC PANEL WITH GFR   Screen for STD (sexually transmitted disease)       Relevant Orders   CT/NG RNA, TMA Rectal   GC/CT Probe, Amp (Throat)   RPR   C. trachomatis/N. gonorrhoeae RNA   Cytology - PAP( [place])   Cancer screening       Hyperlipidemia, unspecified hyperlipidemia type       Relevant Medications   atorvastatin (LIPITOR) 40 MG tablet       #HIV Dx 02/2022. Msm. Per patient he has yearly test and this year this turns positive when he was admitted for acute gastroenteritis. ?acute retroviral syndrome. 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; 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 and he has received all 3 weekly shots by 03/2022 Rpr 1:4 on 09/2022 Triple std screen 04/28/22 negative  -triple screen and rpr titer repeat today   #hcm -vaccination Covid booster 06/2022 Heplisav start 06/15/22 -hepatitis Hep a & c serology negative 04/2022; 06/2022 first heplisav shot; will do 2nd shot of the series 3 month from now on follow up Hep b serology negative  04/2022 -std Triple screen today 01/20/23 Rpr as above 01/20/23 -tb Will review -cancer screening Anal pap 01/20/23 Patient sees pcp who does colon cancer and prostate cancer screening    --------- Addendum Chlamydial infection  Doxy x7 days  Follow-up: Return in about 3 months (around 04/22/2023).  Raymondo Band, MD [facility] 01/20/2023, 9:53 AM

## 2023-01-21 LAB — CT/NG RNA, TMA RECTAL
Chlamydia Trachomatis RNA: DETECTED — AB
Neisseria Gonorrhoeae RNA: NOT DETECTED

## 2023-01-21 LAB — COMPLETE METABOLIC PANEL WITH GFR
AG Ratio: 1.4 (calc) (ref 1.0–2.5)
AST: 41 U/L — ABNORMAL HIGH (ref 10–40)
Albumin: 4.1 g/dL (ref 3.6–5.1)
BUN/Creatinine Ratio: 15 (calc) (ref 6–22)
Calcium: 8.8 mg/dL (ref 8.6–10.3)
Creat: 1.38 mg/dL — ABNORMAL HIGH (ref 0.60–1.29)
Globulin: 3 g/dL (calc) (ref 1.9–3.7)
Glucose, Bld: 87 mg/dL (ref 65–99)
Sodium: 139 mmol/L (ref 135–146)
Total Bilirubin: 0.3 mg/dL (ref 0.2–1.2)

## 2023-01-21 LAB — GC/CHLAMYDIA PROBE, AMP (THROAT)
Chlamydia trachomatis RNA: DETECTED — AB
Neisseria gonorrhoeae RNA: NOT DETECTED

## 2023-01-21 LAB — C. TRACHOMATIS/N. GONORRHOEAE RNA: N. gonorrhoeae RNA, TMA: NOT DETECTED

## 2023-01-22 MED ORDER — DOXYCYCLINE HYCLATE 100 MG PO TABS
100.0000 mg | ORAL_TABLET | Freq: Two times a day (BID) | ORAL | 0 refills | Status: AC
Start: 2023-01-22 — End: 2023-01-29

## 2023-01-22 NOTE — Addendum Note (Signed)
Addended byRutha Bouchard T on: 01/22/2023 05:04 PM   Modules accepted: Orders

## 2023-01-23 ENCOUNTER — Telehealth: Payer: Self-pay

## 2023-01-23 LAB — CYTOLOGY - PAP: Diagnosis: NEGATIVE

## 2023-01-23 LAB — RPR TITER: RPR Titer: 1:2 {titer} — ABNORMAL HIGH

## 2023-01-23 NOTE — Telephone Encounter (Signed)
I spoke to the patient and relayed his positive chlamydia results and advised to pick up doxy from his pharmacy. Patient also advised no sex for 7 days after completing the doxy.. Aaron [name] Jonathon Resides

## 2023-01-23 NOTE — Telephone Encounter (Signed)
-----   Message from Raymondo Band, MD sent at 01/22/2023  5:03 PM EDT ----- Pendign some labs  Please let him know to start doxy bid x7days. Rx placed  thanks

## 2023-01-24 LAB — COMPLETE METABOLIC PANEL WITH GFR
ALT: 31 U/L (ref 9–46)
Alkaline phosphatase (APISO): 33 U/L — ABNORMAL LOW (ref 36–130)
BUN: 21 mg/dL (ref 7–25)
CO2: 28 mmol/L (ref 20–32)
Chloride: 103 mmol/L (ref 98–110)
Potassium: 3.7 mmol/L (ref 3.5–5.3)
Total Protein: 7.1 g/dL (ref 6.1–8.1)
eGFR: 63 mL/min/{1.73_m2} (ref >=60)

## 2023-01-24 LAB — CBC WITH DIFFERENTIAL/PLATELET
Absolute Monocytes: 580 cells/uL (ref 200–950)
Basophils Absolute: 70 cells/uL (ref 0–200)
Basophils Relative: 1.2 %
Eosinophils Absolute: 238 cells/uL (ref 15–500)
Eosinophils Relative: 4.1 %
HCT: 41.6 % (ref 38.5–50.0)
MCH: 29.6 pg (ref 27.0–33.0)
MCHC: 32.7 g/dL (ref 32.0–36.0)
MPV: 10.1 fL (ref 7.5–12.5)
Monocytes Relative: 10 %
Platelets: 307 10*3/uL (ref 140–400)
Total Lymphocyte: 36.1 %

## 2023-01-24 LAB — HIV-1 RNA QUANT-NO REFLEX-BLD
HIV 1 RNA Quant: NOT DETECTED Copies/mL
HIV-1 RNA Quant, Log: NOT DETECTED Log cps/mL

## 2023-01-24 LAB — T-HELPER CELLS (CD4) COUNT (NOT AT ARMC)
Absolute CD4: 963 cells/uL (ref 490–1740)
CD4 T Helper %: 47 % (ref 30–61)
Total lymphocyte count: 2039 cells/uL (ref 850–3900)

## 2023-01-24 LAB — RPR: RPR Ser Ql: REACTIVE — AB

## 2023-01-24 LAB — T PALLIDUM AB: T Pallidum Abs: POSITIVE — AB

## 2023-01-24 LAB — C. TRACHOMATIS/N. GONORRHOEAE RNA: C. trachomatis RNA, TMA: NOT DETECTED

## 2023-02-01 DIAGNOSIS — Z113 Encounter for screening for infections with a predominantly sexual mode of transmission: Secondary | ICD-10-CM | POA: Diagnosis not present

## 2023-03-11 ENCOUNTER — Other Ambulatory Visit: Payer: Self-pay | Admitting: Internal Medicine

## 2023-03-11 DIAGNOSIS — B20 Human immunodeficiency virus [HIV] disease: Secondary | ICD-10-CM

## 2023-03-17 ENCOUNTER — Other Ambulatory Visit: Payer: Self-pay

## 2023-03-17 DIAGNOSIS — B20 Human immunodeficiency virus [HIV] disease: Secondary | ICD-10-CM

## 2023-03-17 MED ORDER — BIKTARVY 50-200-25 MG PO TABS
1.0000 | ORAL_TABLET | Freq: Every day | ORAL | 0 refills | Status: DC
Start: 2023-03-17 — End: 2023-04-13

## 2023-03-17 NOTE — Progress Notes (Signed)
Patient called stating pharmacy does not have rx for Biktarvy on file. Refill sent earlier this week.  Spoke with pharmacist at [facility] who did confirm refill was not received. Resent prescription today.  Juanita Laster, RMA

## 2023-03-22 ENCOUNTER — Other Ambulatory Visit: Payer: Self-pay | Admitting: Internal Medicine

## 2023-03-22 DIAGNOSIS — E785 Hyperlipidemia, unspecified: Secondary | ICD-10-CM

## 2023-04-13 ENCOUNTER — Encounter: Payer: Self-pay | Admitting: Internal Medicine

## 2023-04-13 ENCOUNTER — Other Ambulatory Visit (HOSPITAL_COMMUNITY)
Admission: RE | Admit: 2023-04-13 | Discharge: 2023-04-13 | Disposition: A | Discharge status: Home / Self Care | Payer: 59 | Source: Ambulatory Visit | Attending: Internal Medicine | Admitting: Internal Medicine

## 2023-04-13 ENCOUNTER — Ambulatory Visit (INDEPENDENT_AMBULATORY_CARE_PROVIDER_SITE_OTHER): Payer: 59 | Admitting: Internal Medicine

## 2023-04-13 ENCOUNTER — Other Ambulatory Visit: Payer: Self-pay

## 2023-04-13 VITALS — BP 152/105 | HR 104 | Temp 97.9°F | Ht 72.0 in | Wt 273.0 lb

## 2023-04-13 DIAGNOSIS — Z113 Encounter for screening for infections with a predominantly sexual mode of transmission: Secondary | ICD-10-CM

## 2023-04-13 DIAGNOSIS — Z129 Encounter for screening for malignant neoplasm, site unspecified: Secondary | ICD-10-CM

## 2023-04-13 DIAGNOSIS — A749 Chlamydial infection, unspecified: Secondary | ICD-10-CM

## 2023-04-13 DIAGNOSIS — Z111 Encounter for screening for respiratory tuberculosis: Secondary | ICD-10-CM | POA: Diagnosis not present

## 2023-04-13 DIAGNOSIS — B20 Human immunodeficiency virus [HIV] disease: Secondary | ICD-10-CM | POA: Diagnosis not present

## 2023-04-13 MED ORDER — ATORVASTATIN CALCIUM 40 MG PO TABS
40.0000 mg | ORAL_TABLET | Freq: Every day | ORAL | 11 refills | Status: AC
Start: 2023-04-13 — End: 2024-04-07

## 2023-04-13 MED ORDER — BICTEGRAVIR-EMTRICITAB-TENOFOV 50-200-25 MG PO TABS: 1.0000 | ORAL_TABLET | Freq: Every day | ORAL | 11 refills | Status: DC

## 2023-04-13 NOTE — Addendum Note (Signed)
Addended by: Philippa Chester on: 04/13/2023 11:12 AM   Modules accepted: Orders

## 2023-04-13 NOTE — Addendum Note (Signed)
Addended by: Rutha Bouchard T on: 04/13/2023 11:06 AM   Modules accepted: Orders

## 2023-04-13 NOTE — Addendum Note (Signed)
Addended by: Philippa Chester on: 04/13/2023 11:41 AM   Modules accepted: Orders

## 2023-04-13 NOTE — Addendum Note (Signed)
Addended by: Philippa Chester on: 04/13/2023 11:25 AM   Modules accepted: Orders

## 2023-04-13 NOTE — Progress Notes (Signed)
[facility]      Patient Active Problem List   Diagnosis Date Noted   Acute leg pain, left 09/23/2022   Encounter for screening colonoscopy 09/21/2022   Positive RPR test 02/16/2022   HIV (human immunodeficiency virus infection) (HCC) 02/16/2022   Sepsis due to Escherichia coli (E. coli) (HCC) 02/15/2022   Obesity (BMI 30-39.9) 02/15/2022   Acute colitis 02/14/2022   Hypokalemia 02/14/2022   Essential hypertension 02/14/2022   AKI (acute kidney injury) (HCC) 02/14/2022   Asthma, chronic 02/14/2022   Type 2 diabetes mellitus with complication, without long-term current use of insulin (HCC) 02/14/2022   Angioedema 04/20/2016   Cc -- ongoing hiv care   HPI: Aaron [name] is a 47 y.o. male here for ongoing hiv care   04/13/23 id clinic f/u Tolerating biktarvy well; no missed dose last 4 weeks  Social -- same partner as referred to the last visit Positive chlamydia testing last visit 01/2023 oral and rectal s/p  days doxycycline. Syphilis rpr titer improving from previously    06/15/22 id clinic f/u Patient didn't qualify for the merck 053 study as his viral load was too low He started on biktarvy since last visit. Tolerating it well. No missed dose last 4 weeks No complaint today   Social -- currently sexually active steady relationship with same partner 2 years; partner is seronegative. Condom.  Patient is not concerned about std today and just wants hiv testing   I initially saw him new patient intake on 04/28/22: ------------ He has been checking hiv every year, and only found out this year that he was positive (during hospital admission 02/2022 for gastroenteritis)  Risk - sex msm. No iv or in drug use.   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 Uses marijuanna "a lot." Smokes cigarette 1 pack per week since age 27 Lives a lone Born/raised in [place]; currently residing in [place] No prior outside of [place] travel; no  prior travel [place] but [place] No pets; neighbors have chicken  Hobbies - fishing (his families have farm in [place])  No current concern with his health except having good appetite and gaining weight      Review of Systems: ROS All other ros negative      Past Medical History:  Diagnosis Date   Asthma    Diabetes mellitus without complication (HCC)    Hypertension     Social History   Tobacco Use   Smoking status: Former    Types: Cigarettes   Smokeless tobacco: Never   Tobacco comments:    Quit 10/07/22  Substance Use Topics   Alcohol use: Yes   Drug use: Yes    Types: Marijuana    No family history on file.  Allergies  Allergen Reactions   Levaquin [Levofloxacin] Itching and Rash    OBJECTIVE: There were no vitals filed for this visit.  There is no height or weight on file to calculate BMI.   Physical Exam General/constitutional: no distress, pleasant, obese HEENT: Normocephalic, PER, Conj Clear, EOMI, Oropharynx clear Neck supple CV: rrr no mrg Lungs: clear to auscultation, normal respiratory effort Abd: Soft, Nontender Ext: no edema Skin: No Rash Neuro: nonfocal MSK: no peripheral joint swelling/tenderness/warmth; back spines nontender       Lab: Lab Results  Component Value Date   WBC 5.8 01/20/2023   HGB 13.6 01/20/2023   HCT  41.6 01/20/2023   MCV 90.6 01/20/2023   PLT 307 01/20/2023   Last metabolic panel Lab Results  Component Value Date   GLUCOSE 87 01/20/2023   NA 139 01/20/2023   K 3.7 01/20/2023   CL 103 01/20/2023   CO2 28 01/20/2023   BUN 21 01/20/2023   CREATININE 1.38 (H) 01/20/2023   GFRNONAA >60 12/21/2022   CALCIUM 8.8 01/20/2023   PHOS 3.3 09/25/2022   PROT 7.1 01/20/2023   ALBUMIN 3.7 09/23/2022   BILITOT 0.3 01/20/2023   ALKPHOS 34 (L) 09/23/2022   AST 41 (H) 01/20/2023   ALT 31 01/20/2023   ANIONGAP 8 12/21/2022    HIV: 06/2022     ud 04/2022                /      885  (46%) 02/2022      110    /      Microbiology:  Serology: 02/2022 rpr titer 32  Imaging:   Assessment/plan: Problem List Items Addressed This Visit   None Visit Diagnoses     HIV disease (HCC)    -  Primary   Relevant Orders   HIV 1 RNA quant-no reflex-bld   CBC   COMPLETE METABOLIC PANEL WITH GFR   T-helper cells (CD4) count   Chlamydia infection       Relevant Orders   CT/NG RNA, TMA Rectal   GC/CT Probe, Amp (Throat)   C. trachomatis/N. gonorrhoeae RNA   Screening-pulmonary TB       Relevant Orders   QuantiFERON-TB Gold Plus   Screening for STDs (sexually transmitted diseases)       Relevant Orders   RPR   Cancer screening       Relevant Orders   Cytology - PAP        #HIV Dx 02/2022. Msm. Per patient he has yearly test and this year this turns positive when he was admitted for acute gastroenteritis. ?acute retroviral syndrome. 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 and he has received all 3 weekly shots by 03/2022 Rpr 1:4 on 09/2022 Triple std screen 04/28/22 negative Chlamdyia infection 01/2023 s/p 7 days doxy (oral/rectal infection)  -triple screen and rpr titer repeat today9/5/24   #hcm -vaccination Covid booster 06/2022 Heplisav start 06/15/22 -hepatitis Hep a & c serology negative 04/2022; 06/2022 first heplisav shot; 2nd shot heplisav today 04/13/23 Hep b serology  negative 04/2022 -std Triple screen 01/20/23 --> chlamydia Rpr as above 01/20/23 Repeat std testing today 04/13/23 -tb Quantiferon gold testing today 04/13/23 -cancer screening Anal pap 04/2022 negative -- will repeat today 04/13/23 Patient sees pcp who does colon cancer and prostate cancer screening     Follow-up: No follow-ups on file.  Raymondo Band, MD [facility] 04/13/2023, 10:49 AM

## 2023-04-13 NOTE — Patient Instructions (Signed)
Will do std screen and anal pap smear again today   2nd shot to finish your hepatitis b vaccination   See me in 1 year

## 2023-04-13 NOTE — Addendum Note (Signed)
Addended by: Philippa Chester on: 04/13/2023 11:46 AM   Modules accepted: Orders

## 2023-04-14 LAB — CYTOLOGY, (ORAL, ANAL, URETHRAL) ANCILLARY ONLY
Chlamydia: NEGATIVE
Chlamydia: NEGATIVE
Comment: NEGATIVE
Comment: NEGATIVE
Comment: NORMAL
Comment: NORMAL
Neisseria Gonorrhea: NEGATIVE
Neisseria Gonorrhea: NEGATIVE

## 2023-04-14 LAB — T-HELPER CELLS (CD4) COUNT (NOT AT ARMC)
CD4 % Helper T Cell: 52 % (ref 33–65)
CD4 T Cell Abs: 1040 /uL (ref 400–1790)

## 2023-04-17 LAB — NON-GYN, SPECIMEN A

## 2023-04-17 LAB — URINE CYTOLOGY ANCILLARY ONLY
Chlamydia: NEGATIVE
Comment: NEGATIVE
Comment: NORMAL
Neisseria Gonorrhea: NEGATIVE

## 2023-04-17 LAB — CYTOLOGY - NON PAP

## 2023-04-18 LAB — COMPLETE METABOLIC PANEL WITH GFR
AG Ratio: 1.4 (calc) (ref 1.0–2.5)
ALT: 28 U/L (ref 9–46)
AST: 35 U/L (ref 10–40)
Albumin: 4.8 g/dL (ref 3.6–5.1)
Alkaline phosphatase (APISO): 49 U/L (ref 36–130)
BUN/Creatinine Ratio: 13 (calc) (ref 6–22)
BUN: 22 mg/dL (ref 7–25)
CO2: 24 mmol/L (ref 20–32)
Calcium: 9.7 mg/dL (ref 8.6–10.3)
Chloride: 106 mmol/L (ref 98–110)
Creat: 1.63 mg/dL — ABNORMAL HIGH (ref 0.60–1.29)
Globulin: 3.4 g/dL (ref 1.9–3.7)
Glucose, Bld: 85 mg/dL (ref 65–99)
Potassium: 3.6 mmol/L (ref 3.5–5.3)
Sodium: 141 mmol/L (ref 135–146)
Total Bilirubin: 0.5 mg/dL (ref 0.2–1.2)
Total Protein: 8.2 g/dL — ABNORMAL HIGH (ref 6.1–8.1)
eGFR: 52 mL/min/{1.73_m2} — ABNORMAL LOW (ref >=60)

## 2023-04-18 LAB — QUANTIFERON-TB GOLD PLUS
Mitogen-NIL: >10 [IU]/mL
NIL: 0.04 [IU]/mL
QuantiFERON-TB Gold Plus: NEGATIVE
TB1-NIL: 0 [IU]/mL
TB2-NIL: 0.02 [IU]/mL

## 2023-04-18 LAB — T PALLIDUM AB: T Pallidum Abs: POSITIVE — AB

## 2023-04-18 LAB — CBC
HCT: 45.2 % (ref 38.5–50.0)
Hemoglobin: 14.7 g/dL (ref 13.2–17.1)
MCH: 28.5 pg (ref 27.0–33.0)
MCHC: 32.5 g/dL (ref 32.0–36.0)
MCV: 87.6 fL (ref 80.0–100.0)
MPV: 9.9 fL (ref 7.5–12.5)
Platelets: 354 10*3/uL (ref 140–400)
RBC: 5.16 10*6/uL (ref 4.20–5.80)
RDW: 14.7 % (ref 11.0–15.0)
WBC: 8.8 10*3/uL (ref 3.8–10.8)

## 2023-04-18 LAB — RPR TITER: RPR Titer: 1:2 {titer} — ABNORMAL HIGH

## 2023-04-18 LAB — HIV-1 RNA QUANT-NO REFLEX-BLD
HIV 1 RNA Quant: NOT DETECTED {copies}/mL
HIV-1 RNA Quant, Log: NOT DETECTED {Log_copies}/mL

## 2023-04-18 LAB — RPR: RPR Ser Ql: REACTIVE — AB

## 2023-04-24 ENCOUNTER — Telehealth: Payer: Self-pay

## 2023-04-24 NOTE — Telephone Encounter (Signed)
-----   Message from Aaron [name] sent at 04/21/2023  4:36 PM EDT ----- Hi team Please let him know his hiv/std labs are unconcerning (well controlled hiv)  His creatinine is trending up and he might want to follow up with primary care to see if nephrology evaluation needed  thanks

## 2023-04-24 NOTE — Telephone Encounter (Signed)
Called patient to relay results, no answer and no voicemail box set up. ? ?Sandie Ano, RN ? ?

## 2023-04-25 NOTE — Telephone Encounter (Signed)
Second attempt to reach patient, no answer and unable to leave message.  Sandie Ano, RN

## 2023-04-26 NOTE — Telephone Encounter (Signed)
Patient advised of lab results and verbalized understanding.

## 2023-05-21 ENCOUNTER — Other Ambulatory Visit: Payer: Self-pay

## 2023-05-21 ENCOUNTER — Emergency Department
Admission: EM | Admit: 2023-05-21 | Discharge: 2023-05-21 | Disposition: A | Discharge status: Other Acute Inpatient Hospital | Payer: 59 | Attending: Emergency Medicine | Admitting: Emergency Medicine

## 2023-05-21 ENCOUNTER — Inpatient Hospital Stay
Admission: AD | Admit: 2023-05-21 | Discharge: 2023-05-24 | DRG: 885 | Disposition: A | Discharge status: Home / Self Care | Payer: 59 | Source: Intra-hospital | Attending: Psychiatry | Admitting: Psychiatry

## 2023-05-21 DIAGNOSIS — Z91128 Patient's intentional underdosing of medication regimen for other reason: Secondary | ICD-10-CM

## 2023-05-21 DIAGNOSIS — Z6838 Body mass index (BMI) 38.0-38.9, adult: Secondary | ICD-10-CM | POA: Diagnosis not present

## 2023-05-21 DIAGNOSIS — F1910 Other psychoactive substance abuse, uncomplicated: Secondary | ICD-10-CM | POA: Diagnosis not present

## 2023-05-21 DIAGNOSIS — E119 Type 2 diabetes mellitus without complications: Secondary | ICD-10-CM | POA: Insufficient documentation

## 2023-05-21 DIAGNOSIS — Z9151 Personal history of suicidal behavior: Secondary | ICD-10-CM | POA: Diagnosis not present

## 2023-05-21 DIAGNOSIS — F952 Tourette's disorder: Secondary | ICD-10-CM | POA: Diagnosis present

## 2023-05-21 DIAGNOSIS — F419 Anxiety disorder, unspecified: Secondary | ICD-10-CM | POA: Diagnosis present

## 2023-05-21 DIAGNOSIS — Z79899 Other long term (current) drug therapy: Secondary | ICD-10-CM | POA: Insufficient documentation

## 2023-05-21 DIAGNOSIS — F332 Major depressive disorder, recurrent severe without psychotic features: Secondary | ICD-10-CM | POA: Diagnosis not present

## 2023-05-21 DIAGNOSIS — J449 Chronic obstructive pulmonary disease, unspecified: Secondary | ICD-10-CM | POA: Diagnosis present

## 2023-05-21 DIAGNOSIS — E669 Obesity, unspecified: Secondary | ICD-10-CM | POA: Diagnosis present

## 2023-05-21 DIAGNOSIS — G40909 Epilepsy, unspecified, not intractable, without status epilepticus: Secondary | ICD-10-CM | POA: Diagnosis present

## 2023-05-21 DIAGNOSIS — Z881 Allergy status to other antibiotic agents status: Secondary | ICD-10-CM

## 2023-05-21 DIAGNOSIS — Z87891 Personal history of nicotine dependence: Secondary | ICD-10-CM

## 2023-05-21 DIAGNOSIS — Z21 Asymptomatic human immunodeficiency virus [HIV] infection status: Secondary | ICD-10-CM | POA: Diagnosis present

## 2023-05-21 DIAGNOSIS — R45851 Suicidal ideations: Secondary | ICD-10-CM | POA: Insufficient documentation

## 2023-05-21 DIAGNOSIS — T43206A Underdosing of unspecified antidepressants, initial encounter: Secondary | ICD-10-CM | POA: Diagnosis present

## 2023-05-21 DIAGNOSIS — I10 Essential (primary) hypertension: Secondary | ICD-10-CM | POA: Insufficient documentation

## 2023-05-21 DIAGNOSIS — J45909 Unspecified asthma, uncomplicated: Secondary | ICD-10-CM | POA: Diagnosis not present

## 2023-05-21 DIAGNOSIS — F1590 Other stimulant use, unspecified, uncomplicated: Secondary | ICD-10-CM | POA: Diagnosis present

## 2023-05-21 DIAGNOSIS — G4700 Insomnia, unspecified: Secondary | ICD-10-CM | POA: Diagnosis present

## 2023-05-21 DIAGNOSIS — F1290 Cannabis use, unspecified, uncomplicated: Secondary | ICD-10-CM | POA: Diagnosis present

## 2023-05-21 DIAGNOSIS — F32A Depression, unspecified: Secondary | ICD-10-CM | POA: Diagnosis not present

## 2023-05-21 LAB — COMPREHENSIVE METABOLIC PANEL
ALT: 30 U/L (ref 0–44)
AST: 30 U/L (ref 15–41)
Albumin: 4.3 g/dL (ref 3.5–5.0)
Alkaline Phosphatase: 39 U/L (ref 38–126)
Anion gap: 9 (ref 5–15)
BUN: 15 mg/dL (ref 6–20)
CO2: 26 mmol/L (ref 22–32)
Calcium: 9 mg/dL (ref 8.9–10.3)
Chloride: 104 mmol/L (ref 98–111)
Creatinine, Ser: 1.31 mg/dL — ABNORMAL HIGH (ref 0.61–1.24)
GFR, Estimated: >60 mL/min (ref >60)
Glucose, Bld: 123 mg/dL — ABNORMAL HIGH (ref 70–99)
Potassium: 3.2 mmol/L — ABNORMAL LOW (ref 3.5–5.1)
Sodium: 139 mmol/L (ref 135–145)
Total Bilirubin: 0.7 mg/dL (ref 0.3–1.2)
Total Protein: 8.3 g/dL — ABNORMAL HIGH (ref 6.5–8.1)

## 2023-05-21 LAB — CBC
HCT: 44.9 % (ref 39.0–52.0)
Hemoglobin: 14.5 g/dL (ref 13.0–17.0)
MCH: 28.5 pg (ref 26.0–34.0)
MCHC: 32.3 g/dL (ref 30.0–36.0)
MCV: 88.4 fL (ref 80.0–100.0)
Platelets: 369 10*3/uL (ref 150–400)
RBC: 5.08 MIL/uL (ref 4.22–5.81)
RDW: 15 % (ref 11.5–15.5)
WBC: 5.6 10*3/uL (ref 4.0–10.5)
nRBC: 0 % (ref 0.0–0.2)

## 2023-05-21 LAB — URINE DRUG SCREEN, QUALITATIVE (ARMC ONLY)
Amphetamines, Ur Screen: POSITIVE — AB
Barbiturates, Ur Screen: NOT DETECTED
Benzodiazepine, Ur Scrn: NOT DETECTED
Cannabinoid 50 Ng, Ur ~~LOC~~: POSITIVE — AB
Cocaine Metabolite,Ur ~~LOC~~: NOT DETECTED
MDMA (Ecstasy)Ur Screen: NOT DETECTED
Methadone Scn, Ur: NOT DETECTED
Opiate, Ur Screen: NOT DETECTED
Phencyclidine (PCP) Ur S: NOT DETECTED
Tricyclic, Ur Screen: NOT DETECTED

## 2023-05-21 LAB — ETHANOL: Alcohol, Ethyl (B): <10 mg/dL (ref <10)

## 2023-05-21 LAB — SALICYLATE LEVEL: Salicylate Lvl: <7 mg/dL — ABNORMAL LOW (ref 7.0–30.0)

## 2023-05-21 LAB — ACETAMINOPHEN LEVEL: Acetaminophen (Tylenol), Serum: <10 ug/mL — ABNORMAL LOW (ref 10–30)

## 2023-05-21 MED ORDER — DIPHENHYDRAMINE HCL 50 MG/ML IJ SOLN: 50.0000 mg | Freq: Three times a day (TID) | INTRAMUSCULAR | Status: DC | PRN

## 2023-05-21 MED ORDER — ALBUTEROL SULFATE HFA 108 (90 BASE) MCG/ACT IN AERS: 2.0000 | INHALATION_SPRAY | Freq: Four times a day (QID) | RESPIRATORY_TRACT | Status: DC | PRN

## 2023-05-21 MED ORDER — HALOPERIDOL LACTATE 5 MG/ML IJ SOLN: 5.0000 mg | Freq: Three times a day (TID) | INTRAMUSCULAR | Status: DC | PRN

## 2023-05-21 MED ORDER — BICTEGRAVIR-EMTRICITAB-TENOFOV 50-200-25 MG PO TABS: 1.0000 | ORAL_TABLET | Freq: Every day | ORAL | Status: DC

## 2023-05-21 MED ORDER — MAGNESIUM HYDROXIDE 400 MG/5ML PO SUSP: 30.0000 mL | Freq: Every day | ORAL | Status: DC | PRN

## 2023-05-21 MED ORDER — BICTEGRAVIR-EMTRICITAB-TENOFOV 50-200-25 MG PO TABS
1.0000 | ORAL_TABLET | Freq: Every day | ORAL | Status: DC
  Administered 2023-05-22 – 2023-05-24 (×3): 1 via ORAL
  Filled 2023-05-21 (×3): qty 1

## 2023-05-21 MED ORDER — HYDROXYZINE HCL 25 MG PO TABS
25.0000 mg | ORAL_TABLET | Freq: Three times a day (TID) | ORAL | Status: DC | PRN
  Administered 2023-05-24: 25 mg via ORAL

## 2023-05-21 MED ORDER — ACETAMINOPHEN 325 MG PO TABS: 650.0000 mg | ORAL_TABLET | Freq: Four times a day (QID) | ORAL | Status: DC | PRN

## 2023-05-21 MED ORDER — ATORVASTATIN CALCIUM 20 MG PO TABS
40.0000 mg | ORAL_TABLET | Freq: Every day | ORAL | Status: DC
  Administered 2023-05-21: 40 mg via ORAL
  Filled 2023-05-21: qty 2

## 2023-05-21 MED ORDER — ALUM & MAG HYDROXIDE-SIMETH 200-200-20 MG/5ML PO SUSP: 30.0000 mL | ORAL | Status: DC | PRN

## 2023-05-21 MED ORDER — ALLOPURINOL 100 MG PO TABS: 100.0000 mg | ORAL_TABLET | Freq: Every day | ORAL | Status: DC

## 2023-05-21 MED ORDER — GABAPENTIN 100 MG PO CAPS
100.0000 mg | ORAL_CAPSULE | Freq: Two times a day (BID) | ORAL | Status: DC
  Administered 2023-05-21: 100 mg via ORAL
  Filled 2023-05-21: qty 1

## 2023-05-21 MED ORDER — ESCITALOPRAM OXALATE 10 MG PO TABS
5.0000 mg | ORAL_TABLET | Freq: Every day | ORAL | Status: DC
  Administered 2023-05-22: 5 mg via ORAL
  Filled 2023-05-21: qty 1

## 2023-05-21 MED ORDER — LORAZEPAM 2 MG PO TABS: 2.0000 mg | ORAL_TABLET | Freq: Three times a day (TID) | ORAL | Status: DC | PRN

## 2023-05-21 MED ORDER — HYDROCHLOROTHIAZIDE 25 MG PO TABS
25.0000 mg | ORAL_TABLET | Freq: Every day | ORAL | Status: DC
  Administered 2023-05-22 – 2023-05-24 (×3): 25 mg via ORAL
  Filled 2023-05-21 (×3): qty 1

## 2023-05-21 MED ORDER — LORAZEPAM 2 MG/ML IJ SOLN: 2.0000 mg | Freq: Three times a day (TID) | INTRAMUSCULAR | Status: DC | PRN

## 2023-05-21 MED ORDER — LISINOPRIL 20 MG PO TABS
20.0000 mg | ORAL_TABLET | Freq: Every day | ORAL | Status: DC
  Administered 2023-05-22 – 2023-05-24 (×3): 20 mg via ORAL
  Filled 2023-05-21 (×3): qty 1

## 2023-05-21 MED ORDER — LISINOPRIL-HYDROCHLOROTHIAZIDE 20-25 MG PO TABS: 1.0000 | ORAL_TABLET | Freq: Every day | ORAL | Status: DC

## 2023-05-21 MED ORDER — POTASSIUM CHLORIDE CRYS ER 20 MEQ PO TBCR
40.0000 meq | EXTENDED_RELEASE_TABLET | Freq: Once | ORAL | Status: AC
  Administered 2023-05-21: 40 meq via ORAL
  Filled 2023-05-21: qty 2

## 2023-05-21 MED ORDER — HYDROXYZINE HCL 25 MG PO TABS: 25.0000 mg | ORAL_TABLET | Freq: Three times a day (TID) | ORAL | Status: DC | PRN

## 2023-05-21 MED ORDER — LISINOPRIL 10 MG PO TABS
20.0000 mg | ORAL_TABLET | Freq: Every day | ORAL | Status: DC
  Administered 2023-05-21: 20 mg via ORAL
  Filled 2023-05-21: qty 2

## 2023-05-21 MED ORDER — ATORVASTATIN CALCIUM 20 MG PO TABS
40.0000 mg | ORAL_TABLET | Freq: Every day | ORAL | Status: DC
  Administered 2023-05-22 – 2023-05-24 (×3): 40 mg via ORAL
  Filled 2023-05-21 (×3): qty 2

## 2023-05-21 MED ORDER — HALOPERIDOL 5 MG PO TABS: 5.0000 mg | ORAL_TABLET | Freq: Three times a day (TID) | ORAL | Status: DC | PRN

## 2023-05-21 MED ORDER — HYDROCHLOROTHIAZIDE 25 MG PO TABS
25.0000 mg | ORAL_TABLET | Freq: Every day | ORAL | Status: DC
  Administered 2023-05-21: 25 mg via ORAL
  Filled 2023-05-21: qty 1

## 2023-05-21 MED ORDER — ESCITALOPRAM OXALATE 10 MG PO TABS
5.0000 mg | ORAL_TABLET | Freq: Every day | ORAL | Status: DC
  Administered 2023-05-21: 5 mg via ORAL
  Filled 2023-05-21: qty 1

## 2023-05-21 MED ORDER — DIPHENHYDRAMINE HCL 25 MG PO CAPS: 50.0000 mg | ORAL_CAPSULE | Freq: Three times a day (TID) | ORAL | Status: DC | PRN

## 2023-05-21 NOTE — BH Assessment (Signed)
BED AVAILABLE AT 9PM  Patient is to be admitted to [facility] by Psychiatric Nurse Practitioner  Ophelia Shoulder .  Attending Physician will be Dr. Marlou Porch.   Patient has been assigned to room 311.   Intake Paper Work has been signed and placed on patient chart.  ER staff is aware of the admission: [facility] ER Secretary   Dr. Larinda Buttery, ER MD  [facility] Patient's Nurse

## 2023-05-21 NOTE — Consult Note (Signed)
Telepsych Consultation   Reason for Consult:  Suicidal Ideations Referring Physician:  Chesley Noon Location of Patient:    [facility] ED Location of Provider: Other: virtual home office  Patient Identification: Aaron [name] MRN:  [id number] Principal Diagnosis: Major depressive disorder, recurrent episode, severe (HCC) Diagnosis:  Principal Problem:   Major depressive disorder, recurrent episode, severe (HCC) Active Problems:   Polysubstance abuse (HCC)   Total Time spent with patient: 45 minutes  Subjective:   Aaron [name] is a 47 y.o. male patient admitted with Per RN Triage Note dated 05/21/2023@1316 : "Pt arrived with BPD voluntarily, pt sts that he has been having suicidal thoughts for the last month. Pt sts that he would either cut himself with his knife or walk into traffic. RN secured pt knife with [employer] in triage. "  HPI:   Aaron [name], 47 y.o., male patient   Patient seen via telepsych by this provider; chart reviewed and consulted with Dr. Marlou Porch on 05/21/23.  On evaluation Aaron [name] reports feeling of worthlessness, hopelessness and despair.  Patient reports he's had suicidal ideations for one month, today tings got worse and he was gonna to walk into traffic to end his life but decide to come to the emergency department.  He cites psychosocial stressor, his mother has cancer and he's dealing with his own chronic medical concerns.    He reports a hx for bipolar depression.  He reports 2 prior suicide attempts via overdose on pills; he was hospitalized for both but reports during one attempts he took a mixture of 60 trazodone and Ambien pills, which landed him on a ventilator. At present he is not taking prescribed antidepressants but report self medicating with "weed daily."  Also reports using methamphetamines, his urine drug screen substantiates usage of both.  He reports his suicidal thoughts occurs during periods of sobriety as well. He reports  he works at [employer], and he lives there as well.  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  He states he does not use drugs while working but does this on his off time. He reports sleep as fair and denies appetite problems.   He reports pmhx of HIV, epilepsy, DM gout, COPD, Obesity and Tourette's Syndrome.  After completing sentences he does make repeated verbal gestures.  Labs: UDS is + for amphetamine and cannabinoids K is 3.2- he received replacement therapy LFTs within normal limits CBC within normal limits Pt medically cleared prior to psych assessment.  During evaluation Aaron [name] is seated on he chair in the supply room; he is alert/oriented x 4; Stated mood is depressed and hopeless, but cooperative; and mood congruent with affect.  Patient is speaking in a clear tone at moderate volume, and normal pace; with good eye contact.  His thought process is coherent and relevant; There is no indication that he is currently responding to internal/external stimuli or experiencing delusional thought content.  Patient endorses suicidal ideations with plan and intent to walk into traffic. He denies homicidal ideation, psychosis, and paranoia.  Patient has remained cooperative throughout assessment and has answered questions appropriately.    Past Psychiatric History:  Prior suicide attempts: "They were approximately ten and five years ago. Both required medical attention. The most serious attempt resulted in him placed on a ventilator. Reported symptoms of depression are increase appetite, as well as the increase thoughts and feelings of hopelessness, helplessness and worthlessness."  Risk to Self:  yes:   Prior  Inpatient Therapy:  ues Prior Outpatient Therapy:  yes  Past Medical History:  Past Medical History:  Diagnosis Date   Asthma    Diabetes mellitus without complication (HCC)    Hypertension     Past Surgical History:  Procedure Laterality Date   COLONOSCOPY WITH  PROPOFOL N/A 09/21/2022   Procedure: COLONOSCOPY WITH PROPOFOL;  Surgeon: Toney Reil, MD;  Location: [facility] END[facility]COPY;  Service: Gastroenterology;  Laterality: N/A;   Family History: No family history on file. Family Psychiatric  History: deferred Social History:  Social History   Substance and Sexual Activity  Alcohol Use Yes     Social History   Substance and Sexual Activity  Drug Use Yes   Types: Marijuana    Social History   Socioeconomic History   Marital status: Single    Spouse name: Not on file   Number of children: Not on file   Years of education: Not on file   Highest education level: Not on file  Occupational History   Not on file  Tobacco Use   Smoking status: Former    Types: Cigarettes   Smokeless tobacco: Never   Tobacco comments:    Quit 10/07/22  Substance and Sexual Activity   Alcohol use: Yes   Drug use: Yes    Types: Marijuana   Sexual activity: Not Currently    Partners: Male    Birth control/protection: Condom    Comment: declined condoms  Other Topics Concern   Not on file  Social History Narrative   Not on file   Social Determinants of Health   Financial Resource Strain: Not on file  Food Insecurity: No Food Insecurity (09/23/2022)   Hunger Vital Sign    Worried About Running Out of Food in the Last Year: Never true    Ran Out of Food in the Last Year: Never true  Transportation Needs: No Transportation Needs (09/23/2022)   PRAPARE - Administrator, Civil Service (Medical): No    Lack of Transportation (Non-Medical): No  Physical Activity: Not on file  Stress: Not on file  Social Connections: Not on file   Additional Social History:    Allergies:   Allergies  Allergen Reactions   Levaquin [Levofloxacin] Itching and Rash    Labs:  Results for orders placed or performed during the hospital encounter of 05/21/23 (from the past 48 hour(s))  Comprehensive metabolic panel     Status: Abnormal   Collection Time:  05/21/23  1:19 PM  Result Value Ref Range   Sodium 139 135 - 145 mmol/L   Potassium 3.2 (L) 3.5 - 5.1 mmol/L   Chloride 104 98 - 111 mmol/L   CO2 26 22 - 32 mmol/L   Glucose, Bld 123 (H) 70 - 99 mg/dL    Comment: Glucose reference range applies only to samples taken after fasting for at least 8 hours.   BUN 15 6 - 20 mg/dL   Creatinine, Ser 7.84 (H) 0.61 - 1.24 mg/dL   Calcium 9.0 8.9 - 69.6 mg/dL   Total Protein 8.3 (H) 6.5 - 8.1 g/dL   Albumin 4.3 3.5 - 5.0 g/dL   AST 30 15 - 41 U/L   ALT 30 0 - 44 U/L   Alkaline Phosphatase 39 38 - 126 U/L   Total Bilirubin 0.7 0.3 - 1.2 mg/dL   GFR, Estimated >29 >52 mL/min    Comment: (NOTE) Calculated using the CKD-EPI Creatinine Equation (2021)    Anion gap 9 5 -  15    Comment: Performed at [facility], [address] [place], [place] [address]  Ethanol     Status: None   Collection Time: 05/21/23  1:19 PM  Result Value Ref Range   Alcohol, Ethyl (B) <10 <10 mg/dL    Comment: (NOTE) Lowest detectable limit for serum alcohol is 10 mg/dL.  For medical purposes only. Performed at [facility], [address] [place], [place] [address]   Salicylate level     Status: Abnormal   Collection Time: 05/21/23  1:19 PM  Result Value Ref Range   Salicylate Lvl <7.0 (L) 7.0 - 30.0 mg/dL    Comment: Performed at [facility], [address] [place], [place] [address]  Acetaminophen level     Status: Abnormal   Collection Time: 05/21/23  1:19 PM  Result Value Ref Range   Acetaminophen (Tylenol), Serum <10 (L) 10 - 30 ug/mL    Comment: (NOTE) Therapeutic concentrations vary significantly. A range of 10-30 ug/mL  may be an effective concentration for many patients. However, some  are best treated at concentrations outside of this range. Acetaminophen concentrations >150 ug/mL at 4 hours after ingestion  and >50 ug/mL at 12 hours after ingestion are often associated with  toxic reactions.  Performed at [facility], [address] [place], [place] [address]   cbc     Status: None   Collection Time: 05/21/23  1:19 PM  Result Value Ref Range   WBC 5.6 4.0 - 10.5 K/uL   RBC 5.08 4.22 - 5.81 MIL/uL   Hemoglobin 14.5 13.0 - 17.0 g/dL   HCT 29.5 62.1 - 30.8 %   MCV 88.4 80.0 - 100.0 fL   MCH 28.5 26.0 - 34.0 pg   MCHC 32.3 30.0 - 36.0 g/dL   RDW 65.7 84.6 - 96.2 %   Platelets 369 150 - 400 K/uL   nRBC 0.0 0.0 - 0.2 %    Comment: Performed at [facility], [address] [place], [place] [address]  Urine Drug Screen, Qualitative     Status: Abnormal   Collection Time: 05/21/23  1:19 PM  Result Value Ref Range   Tricyclic, Ur Screen NONE DETECTED NONE DETECTED   Amphetamines, Ur Screen POSITIVE (A) NONE DETECTED   MDMA (Ecstasy)Ur Screen NONE DETECTED NONE DETECTED   Cocaine Metabolite,Ur [place] NONE DETECTED NONE DETECTED   Opiate, Ur Screen NONE DETECTED NONE DETECTED   Phencyclidine (PCP) Ur S NONE DETECTED NONE DETECTED   Cannabinoid 50 Ng, Ur [place] POSITIVE (A) NONE DETECTED   Barbiturates, Ur Screen NONE DETECTED NONE DETECTED   Benzodiazepine, Ur Scrn NONE DETECTED NONE DETECTED   Methadone Scn, Ur NONE DETECTED NONE DETECTED    Comment: (NOTE) Tricyclics + metabolites, urine    Cutoff 1000 ng/mL Amphetamines + metabolites, urine  Cutoff 1000 ng/mL MDMA (Ecstasy), urine              Cutoff 500 ng/mL Cocaine Metabolite, urine          Cutoff 300 ng/mL Opiate + metabolites, urine        Cutoff 300 ng/mL Phencyclidine (PCP), urine         Cutoff 25 ng/mL Cannabinoid, urine                 Cutoff 50 ng/mL Barbiturates + metabolites, urine  Cutoff 200 ng/mL Benzodiazepine, urine              Cutoff 200 ng/mL  Methadone, urine                   Cutoff 300 ng/mL  The urine drug screen provides only a preliminary, unconfirmed analytical test result and should not be used for non-medical purposes. Clinical consideration and professional judgment should be applied to any  positive drug screen result due to possible interfering substances. A more specific alternate chemical method must be used in order to obtain a confirmed analytical result. Gas chromatography / mass spectrometry (GC/MS) is the preferred confirm atory method. Performed at [facility], [address] [place], [place] [address]     Medications:  Current Facility-Administered Medications  Medication Dose Route Frequency Provider Last Rate Last Admin   albuterol (VENTOLIN HFA) 108 (90 Base) MCG/ACT inhaler 2 puff  2 puff Inhalation Q6H PRN Chesley Noon, MD       atorvastatin (LIPITOR) tablet 40 mg  40 mg Oral Daily Chesley Noon, MD   40 mg at 05/21/23 1832   [START ON 05/22/2023] bictegravir-emtricitabine-tenofovir AF (BIKTARVY) 50-200-25 MG per tablet 1 tablet  1 tablet Oral Daily Chesley Noon, MD       escitalopram (LEXAPRO) tablet 5 mg  5 mg Oral Daily Ophelia Shoulder E, NP       gabapentin (NEURONTIN) capsule 100 mg  100 mg Oral BID Ophelia Shoulder E, NP       lisinopril (ZESTRIL) tablet 20 mg  20 mg Oral Daily Coulter, Eber Jones, RPH   20 mg at 05/21/23 1832   And   hydrochlorothiazide (HYDRODIURIL) tablet 25 mg  25 mg Oral Daily Celene Squibb, RPH   25 mg at 05/21/23 1832   hydrOXYzine (ATARAX) tablet 25 mg  25 mg Oral TID PRN Chales Abrahams, NP       Current Outpatient Medications  Medication Sig Dispense Refill   albuterol (VENTOLIN HFA) 108 (90 Base) MCG/ACT inhaler Inhale 2 puffs into the lungs every 6 (six) hours as needed for wheezing or shortness of breath. 8 g 2   allopurinol (ZYLOPRIM) 100 MG tablet Take 1 tablet (100 mg total) by mouth daily. 30 tablet 2   atorvastatin (LIPITOR) 40 MG tablet Take 1 tablet (40 mg total) by mouth daily. 30 tablet 11   bictegravir-emtricitabine-tenofovir AF (BIKTARVY) 50-200-25 MG TABS tablet Take 1 tablet by mouth daily. 30 tablet 11   ipratropium-albuterol (DUONEB) 0.5-2.5 (3) MG/3ML SOLN Take 3 mLs by nebulization every 4  (four) hours as needed. 360 mL 3   lisinopril-hydrochlorothiazide (ZESTORETIC) 20-25 MG tablet Take by mouth.      Musculoskeletal: pt moves all extremities and ambulates independently.  Strength & Muscle Tone: within normal limits Gait & Station: normal Patient leans: N/A          Psychiatric Specialty Exam:  Presentation  General Appearance: Appropriate for Environment; Casual  Eye Contact:Fair  Speech:Clear and Coherent; Normal Rate  Speech Volume:Normal  Handedness:Right   Mood and Affect  Mood:Anxious; Dysphoric; Depressed; Worthless  Affect:Congruent; Blunt   Thought Process  Thought Processes:Coherent; Goal Directed  Descriptions of Associations:Intact  Orientation:Full (Time, Place and Person)  Thought Content:Illogical  History of Schizophrenia/Schizoaffective disorder:No  Duration of Psychotic Symptoms:No data recorded Hallucinations:Hallucinations: None  Ideas of Reference:None  Suicidal Thoughts:Suicidal Thoughts: Yes, Active SI Active Intent and/or Plan: With Intent; With Plan; With Means to Carry Out; With Access to Means  Homicidal Thoughts:Homicidal Thoughts: No   Sensorium  Memory:Immediate Good; Remote Fair; Recent Good  Judgment:-- (impuslive AEB polysubstance usage)  Insight:Lacking  Executive Functions  Concentration:Fair  Attention Span:Fair  Recall:Good  [employer] of Knowledge:Good  Language:Good   Psychomotor Activity  Psychomotor Activity:Psychomotor Activity: Increased   Assets  Assets:Communication Skills   Sleep  Sleep:Sleep: Fair Number of Hours of Sleep: 5    Physical Exam: Physical Exam Constitutional:      Appearance: He is obese.  Cardiovascular:     Rate and Rhythm: Normal rate.     Pulses: Normal pulses.  Musculoskeletal:        General: Normal range of motion.     Cervical back: Normal range of motion.  Neurological:     Mental Status: He is alert and oriented to person, place, and  time. Mental status is at baseline.  Psychiatric:        Attention and Perception: Attention and perception normal.        Mood and Affect: Mood is anxious and depressed. Affect is blunt.        Speech: Speech normal.        Behavior: Behavior is cooperative.        Thought Content: Thought content is not paranoid or delusional. Thought content includes suicidal ideation. Thought content does not include homicidal ideation. Thought content includes suicidal plan. Thought content does not include homicidal plan.        Cognition and Memory: Cognition and memory normal.        Judgment: Judgment is impulsive.    Review of Systems  Constitutional: Negative.   HENT: Negative.    Eyes: Negative.   Respiratory: Negative.    Cardiovascular: Negative.   Gastrointestinal: Negative.   Genitourinary: Negative.   Musculoskeletal: Negative.   Skin: Negative.   Neurological: Negative.   Endo/Heme/Allergies: Negative.   Psychiatric/Behavioral:  Positive for depression, substance abuse and suicidal ideas. The patient is nervous/anxious.    Blood pressure (!) 153/93, pulse 88, temperature (!) 97.5 F (36.4 C), temperature source Axillary, resp. rate 19, height 6' (1.829 m), weight 128.8 kg, SpO2 94%. Body mass index is 38.52 kg/m.  Treatment Plan Summary: Patient with hx for bipolar depression disorder, polysubstance abuse, presents with suicidal thoughts and a plan to walk into traffic; triggered by psychosocial stressors.  He has hx for prior suicide attempts, and currently cannot contract for safety.  He is referred for inpatient admission for safety monitoring and to start antidepressant medication for mood stability. Pt voluntarily accepts admission, and agrees to start medications as outlined below.    Daily contact with patient to assess and evaluate symptoms and progress in treatment and Medication management  Medications: Pending EKG to rule out prolonged qt intervals Start escitalopram  5mg  po daily  Hydroxyzine 25mg  po TID prn anxiety Agitation Protocol  Disposition: Recommend psychiatric Inpatient admission when medically cleared.  This service was provided via telemedicine using a 2-way, interactive audio and video technology.  Dr. Chesley Noon, Robinette Haines, Rosey Bath, Scherrie Merritts, RN were all informed of above recommendation and disposition via secure chat  Names of all persons participating in this telemedicine service and their role in this encounter. Name: Amareon Phung Role: Patient  Name: Ophelia Shoulder Role: PMHNP  Name: Robinette Haines Role: TTS Counselor  Name: Elane Fritz Role: Psychiatrist    Chales Abrahams, NP 05/21/2023 7:43 PM

## 2023-05-21 NOTE — BH Assessment (Signed)
[facility] AC Tresa Endo S.) updated about the patient's disposition for inpatient treatment.

## 2023-05-21 NOTE — ED Provider Notes (Signed)
[facility] Provider Note    Event Date/Time   First MD Initiated Contact with Patient 05/21/23 1459     (approximate)   History   Chief Complaint Suicidal   HPI  Aaron [name] is a 47 y.o. male with past medical history of hypertension, diabetes, asthma, angioedema, and HIV who presents to the ED complaining of suicidal ideation.  Patient reports that he has been feeling depressed for about the past month, has been having thoughts of harming himself during this time.  He states that he has thoughts about cutting himself with a knife or walking into traffic, did arrive to the ED with a knife that was secured by security.  He admits to marijuana use and methamphetamine use, states he has been taking his medications as prescribed.  He denies any medical complaints at this time.     Physical Exam   Triage Vital Signs: ED Triage Vitals [05/21/23 1317]  Encounter Vitals Group     BP (!) 158/112     Systolic BP Percentile      Diastolic BP Percentile      Pulse Rate 94     Resp 18     Temp 98.4 F (36.9 C)     Temp Source Oral     SpO2 100 %     Weight 284 lb (128.8 kg)     Height 6' (1.829 m)     Head Circumference      Peak Flow      Pain Score 0     Pain Loc      Pain Education      Exclude from Growth Chart     Most recent vital signs: Vitals:   05/21/23 1317  BP: (!) 158/112  Pulse: 94  Resp: 18  Temp: 98.4 F (36.9 C)  SpO2: 100%    Constitutional: Alert and oriented. Eyes: Conjunctivae are normal. Head: Atraumatic. Nose: No congestion/rhinnorhea. Mouth/Throat: Mucous membranes are moist.  Cardiovascular: Normal rate, regular rhythm. Grossly normal heart sounds.  2+ radial pulses bilaterally. Respiratory: Normal respiratory effort.  No retractions. Lungs CTAB. Gastrointestinal: Soft and nontender. No distention. Musculoskeletal: No lower extremity tenderness nor edema.  Neurologic:  Normal speech and language. No gross  focal neurologic deficits are appreciated.    ED Results / Procedures / Treatments   Labs (all labs ordered are listed, but only abnormal results are displayed) Labs Reviewed  COMPREHENSIVE METABOLIC PANEL - Abnormal; Notable for the following components:      Result Value   Potassium 3.2 (*)    Glucose, Bld 123 (*)    Creatinine, Ser 1.31 (*)    Total Protein 8.3 (*)    All other components within normal limits  SALICYLATE LEVEL - Abnormal; Notable for the following components:   Salicylate Lvl <7.0 (*)    All other components within normal limits  ACETAMINOPHEN LEVEL - Abnormal; Notable for the following components:   Acetaminophen (Tylenol), Serum <10 (*)    All other components within normal limits  URINE DRUG SCREEN, QUALITATIVE ([facility] ONLY) - Abnormal; Notable for the following components:   Amphetamines, Ur Screen POSITIVE (*)    Cannabinoid 50 Ng, Ur [place] POSITIVE (*)    All other components within normal limits  ETHANOL  CBC    PROCEDURES:  Critical Care performed: No  Procedures   MEDICATIONS ORDERED IN ED: Medications  bictegravir-emtricitabine-tenofovir AF (BIKTARVY) 50-200-25 MG per tablet 1 tablet (has no administration in time range)  atorvastatin (LIPITOR) tablet 40 mg (has no administration in time range)  allopurinol (ZYLOPRIM) tablet 100 mg (has no administration in time range)  albuterol (VENTOLIN HFA) 108 (90 Base) MCG/ACT inhaler 2 puff (has no administration in time range)  lisinopril-hydrochlorothiazide (ZESTORETIC) 20-25 MG per tablet 1 tablet (has no administration in time range)  potassium chloride SA (KLOR-CON M) CR tablet 40 mEq (40 mEq Oral Given 05/21/23 1654)     IMPRESSION / MDM / ASSESSMENT AND PLAN / ED COURSE  I reviewed the triage vital signs and the nursing notes.                              47 y.o. male with past medical history of hypertension, diabetes, asthma, angioedema, and HIV who presents to the ED complaining of  suicidal ideation and depression for the past month.  Patient's presentation is most consistent with acute presentation with potential threat to life or bodily function.  Differential diagnosis includes, but is not limited to, depression, anxiety, psychosis, suicidal ideation, homicidal ideation, medication noncompliance, substance abuse.  Patient nontoxic-appearing and in no acute distress, vital signs remarkable for hypertension but otherwise reassuring.  He denies any medical complaints and screening labs are reassuring with no significant anemia, leukocytosis, electrolyte abnormality, or AKI.  LFTs are unremarkable, Tylenol and salicylate levels are undetectable.  Patient may be medically cleared for psychiatric disposition, he is calm and cooperative and we will maintain voluntary status at this time.  The patient has been placed in psychiatric observation due to the need to provide a safe environment for the patient while obtaining psychiatric consultation and evaluation, as well as ongoing medical and medication management to treat the patient's condition.  The patient has not been placed under full IVC at this time.      FINAL CLINICAL IMPRESSION(S) / ED DIAGNOSES   Final diagnoses:  Suicidal ideation     Rx / DC Orders   ED Discharge Orders     None        Note:  This document was prepared using Dragon voice recognition software and may include unintentional dictation errors.   Chesley Noon, MD 05/21/23 [phone]

## 2023-05-21 NOTE — ED Triage Notes (Addendum)
Pt arrived with BPD voluntarily, pt sts that he has been having suicidal thoughts for the last month. Pt sts that he would either cut himself with his knife or walk into traffic. RN secured pt knife with [employer] in triage.

## 2023-05-21 NOTE — Group Note (Signed)
Date:  05/22/2023 Time:  12:03 AM  Group Topic/Focus:  Wrap-Up Group:   The focus of this group is to help patients review their daily goal of treatment and discuss progress on daily workbooks.    Participation Level:  Did Not Attend  Lenore Cordia 05/22/2023, 12:03 AM

## 2023-05-21 NOTE — ED Notes (Signed)
Attempted to call report, told nurse was busy and would call back

## 2023-05-21 NOTE — ED Notes (Signed)
Dinner tray provided

## 2023-05-21 NOTE — ED Notes (Signed)
Pt changed into psych safe clothes.  Pt belongings: Black shirt Blue pants Black belt Black and grey underwear Black socks Blue shoes Phone [employer] Cigarettes Medication, tow bottles one orange and the other white Red, white and blue knife secured with security

## 2023-05-21 NOTE — BH Assessment (Signed)
Comprehensive Clinical Assessment (CCA) Note  05/21/2023 Aaron [name] [id number]  Chief Complaint:  Chief Complaint  Patient presents with   Suicidal   Visit Diagnosis: Major Depression   Aaron [name]. Full is a 47 year old male who presents to the ER due to having increase thoughts of ending his life by overdosing, cutting his wrist and/or walking into traffic. Contributing factors are; his mother was recently diagnosed with cancer, his health is declining and untreated depression. He reports of having two previous suicide attempts. They were approximately ten and five years ago. Both required medical attention. The most serious attempt resulted in him placed on a ventilator. Reported symptoms of depression are increase appetite, as well as the increase thoughts and feelings of hopelessness, helplessness and worthlessness. Patient admits to the use of cannabis and methamphetamine. Per his report, it helps him calm down and relax.  During the interview, patient was calm, cooperative and pleasant. He was able to provide appropriate answers to the questions. He denies HI and AV/H.  CCA Screening, Triage and Referral (STR)  Patient Reported Information How did you hear about [place]? Self  What Is the Reason for Your Visit/Call Today? Increase thoughts of ending his life by overdosing, cutting his wrist or walk into traffic.  How Long Has This Been Causing You Problems? 1 wk - 1 month  What Do You Feel Would Help You the Most Today? Treatment for Depression or other mood problem; Alcohol or Drug Use Treatment   Have You Recently Had Any Thoughts About Hurting Yourself? Yes  Are You Planning to Commit Suicide/Harm Yourself At This time? Yes   Flowsheet Row ED from 05/21/2023 in [facility] Department at [facility] ED from 12/21/2022 in [facility] Department at [facility] ED from 10/21/2022 in [facility] Emergency Department at [facility]  C-SSRS  RISK CATEGORY High Risk No Risk No Risk       Have you Recently Had Thoughts About Hurting Someone Karolee Ohs? No  Are You Planning to Harm Someone at This Time? No  Explanation: No data recorded  Have You Used Any Alcohol or Drugs in the Past 24 Hours? No  What Did You Use and How Much? Cannabis   Do You Currently Have a Therapist/Psychiatrist? Yes  Name of Therapist/Psychiatrist:    Have You Been Recently Discharged From Any Office Practice or Programs? No  Explanation of Discharge From Practice/Program: No data recorded    CCA Screening Triage Referral Assessment Type of Contact: Face-to-Face  Telemedicine Service Delivery:   Is this Initial or Reassessment?   Date Telepsych consult ordered in [facility]:    Time Telepsych consult ordered in [facility]:    Location of Assessment: [facility] ED  Provider Location: [facility] ED   Collateral Involvement: No data recorded  Does Patient Have a Court Appointed Legal Guardian? No  Legal Guardian Contact Information: No data recorded Copy of Legal Guardianship Form: No data recorded Legal Guardian Notified of Arrival: No data recorded Legal Guardian Notified of Pending Discharge: No data recorded If Minor and Not Living with Parent(s), Who has Custody? No data recorded Is CPS involved or ever been involved? Never  Is APS involved or ever been involved? Never  Patient Determined To Be At Risk for Harm To Self or Others Based on Review of Patient Reported Information or Presenting Complaint? Yes, for Self-Harm  Method: No data recorded Availability of Means: No data recorded Intent: No data recorded Notification Required: No data recorded Additional Information for Danger  to Others Potential: No data recorded Additional Comments for Danger to Others Potential: No data recorded Are There Guns or Other Weapons in Your Home? No  Types of Guns/Weapons: No data recorded Are These Weapons Safely Secured?                            No data  recorded Who Could Verify You Are Able To Have These Secured: No data recorded Do You Have any Outstanding Charges, Pending Court Dates, Parole/Probation? No data recorded Contacted To Inform of Risk of Harm To Self or Others: No data recorded   Does Patient Present under Involuntary Commitment? No    [place] of Residence: [place]   Patient Currently Receiving the Following Services: Not Receiving Services   Determination of Need: Emergent (2 hours)   Options For Referral: ED Visit; Inpatient Hospitalization     CCA Biopsychosocial Patient Reported Schizophrenia/Schizoaffective Diagnosis in Past: No   Strengths: Have some insight, stable housing and employed.   Mental Health Symptoms Depression:   Change in energy/activity; Difficulty Concentrating; Hopelessness; Increase/decrease in appetite; Weight gain/loss; Worthlessness; Sleep (too much or little)   Duration of Depressive symptoms:  Duration of Depressive Symptoms: Greater than two weeks   Mania:   None   Anxiety:    Difficulty concentrating; Tension; Sleep; Worrying   Psychosis:   None   Duration of Psychotic symptoms:    Trauma:   N/A   Obsessions:   N/A   Compulsions:   N/A   Inattention:   N/A   Hyperactivity/Impulsivity:   N/A   Oppositional/Defiant Behaviors:   N/A   Emotional Irregularity:   N/A   Other Mood/Personality Symptoms:  No data recorded   Mental Status Exam Appearance and self-care  Stature:   Average   Weight:   Obese   Clothing:   Neat/clean; Age-appropriate   Grooming:   Normal   Cosmetic use:   None   Posture/gait:   Normal   Motor activity:   -- (Within normal range)   Sensorium  Attention:   Normal   Concentration:   Normal   Orientation:   X5   Recall/memory:   Normal   Affect and Mood  Affect:   Depressed; Full Range   Mood:   Depressed   Relating  Eye contact:   Normal   Facial expression:   Responsive   Attitude  toward examiner:   Cooperative   Thought and Language  Speech flow:  Clear and Coherent; Normal   Thought content:   Appropriate to Mood and Circumstances   Preoccupation:   None   Hallucinations:   None   Organization:   Coherent; Intact   [employer] of Knowledge:   Good   Intelligence:   Average   Abstraction:   Functional   Judgement:   Fair   Dance movement psychotherapist:   Realistic   Insight:   Fair   Decision Making:   Vacilates   Social Functioning  Social Maturity:   Isolates; Impulsive   Social Judgement:   Chemical engineer"; Heedless   Stress  Stressors:   Transitions; Illness   Coping Ability:   Overwhelmed   Skill Deficits:   None   Supports:   Friends/Service system     Religion: Religion/Spirituality Are You A Religious Person?: No  Leisure/Recreation: Leisure / Recreation Do You Have Hobbies?: No  Exercise/Diet: Exercise/Diet Do You Exercise?: No Have You Gained or Lost A  Significant Amount of Weight in the Past Six Months?: No Do You Follow a Special Diet?: No Do You Have Any Trouble Sleeping?: Yes   CCA Employment/Education Employment/Work Situation: Employment / Work Situation Employment Situation: Employed Has Patient ever Been in Equities trader?: No  Education: Education Is Patient Currently Attending School?: No Did You Have An Individualized Education Program (IIEP): No Did You Have Any Difficulty At [employer]?: No Patient's Education Has Been Impacted by Current Illness: No   CCA Family/Childhood History Family and Relationship History: Family history Marital status: Single Does patient have children?: No  Childhood History:  Childhood History By whom was/is the patient raised?: Mother Did patient suffer any verbal/emotional/physical/sexual abuse as a child?: No Did patient suffer from severe childhood neglect?: No Has patient ever been sexually abused/assaulted/raped as an adolescent or adult?:  No Was the patient ever a victim of a crime or a disaster?: No Witnessed domestic violence?: No Has patient been affected by domestic violence as an adult?: No   CCA Substance Use Alcohol/Drug Use: Alcohol / Drug Use Pain Medications: See PTA Prescriptions: See PTA Over the Counter: See PTA History of alcohol / drug use?: Yes Substance #1 Name of Substance 1: Cannabis 1 - Frequency: Daily Substance #2 Name of Substance 2: Methamphetamine 2 - Frequency: Unable to quantify 2 - Last Use / Amount: Three days ago   ASAM's:  Six Dimensions of Multidimensional Assessment  Dimension 1:  Acute Intoxication and/or Withdrawal Potential:      Dimension 2:  Biomedical Conditions and Complications:      Dimension 3:  Emotional, Behavioral, or Cognitive Conditions and Complications:     Dimension 4:  Readiness to Change:     Dimension 5:  Relapse, Continued use, or Continued Problem Potential:     Dimension 6:  Recovery/Living Environment:     ASAM Severity Score:    ASAM Recommended Level of Treatment:     Substance use Disorder (SUD)    Recommendations for Services/Supports/Treatments:    Discharge Disposition:    DSM5 Diagnoses: Patient Active Problem List   Diagnosis Date Noted   Acute leg pain, left 09/23/2022   Encounter for screening colonoscopy 09/21/2022   Positive RPR test 02/16/2022   HIV (human immunodeficiency virus infection) (HCC) 02/16/2022   Sepsis due to Escherichia coli (E. coli) (HCC) 02/15/2022   Obesity (BMI 30-39.9) 02/15/2022   Acute colitis 02/14/2022   Hypokalemia 02/14/2022   Essential hypertension 02/14/2022   AKI (acute kidney injury) (HCC) 02/14/2022   Asthma, chronic 02/14/2022   Type 2 diabetes mellitus with complication, without long-term current use of insulin (HCC) 02/14/2022   Angioedema 04/20/2016    Referrals to Alternative Service(s): Referred to Alternative Service(s):   Place:   Date:   Time:    Referred to Alternative  Service(s):   Place:   Date:   Time:    Referred to Alternative Service(s):   Place:   Date:   Time:    Referred to Alternative Service(s):   Place:   Date:   Time:     Lilyan Gilford MS, LCAS, [facility], [facility] Therapeutic Triage Specialist 05/21/2023 5:11 PM

## 2023-05-21 NOTE — ED Notes (Signed)
Attempted to call report per secure chat message.  Told to call back.

## 2023-05-22 ENCOUNTER — Encounter: Payer: Self-pay | Admitting: Psychiatry

## 2023-05-22 ENCOUNTER — Other Ambulatory Visit: Payer: Self-pay

## 2023-05-22 DIAGNOSIS — F332 Major depressive disorder, recurrent severe without psychotic features: Principal | ICD-10-CM

## 2023-05-22 MED ORDER — ESCITALOPRAM OXALATE 10 MG PO TABS
10.0000 mg | ORAL_TABLET | Freq: Every day | ORAL | Status: DC
  Administered 2023-05-23 – 2023-05-24 (×2): 10 mg via ORAL
  Filled 2023-05-22 (×2): qty 1

## 2023-05-22 MED ORDER — NICOTINE 14 MG/24HR TD PT24
14.0000 mg | MEDICATED_PATCH | Freq: Every day | TRANSDERMAL | Status: DC
  Administered 2023-05-22: 14 mg via TRANSDERMAL
  Filled 2023-05-22 (×3): qty 1

## 2023-05-22 MED ORDER — TRAZODONE HCL 50 MG PO TABS: 50.0000 mg | ORAL_TABLET | Freq: Every day | ORAL | Status: DC

## 2023-05-22 NOTE — BH IP Treatment Plan (Signed)
Interdisciplinary Treatment and Diagnostic Plan Update  05/22/2023 Time of Session: 10:21 AM  Aaron [name] MRN: [id number]  Principal Diagnosis: MDD (major depressive disorder), recurrent episode, severe (HCC)  Secondary Diagnoses: Principal Problem:   MDD (major depressive disorder), recurrent episode, severe (HCC)   Current Medications:  Current Facility-Administered Medications  Medication Dose Route Frequency Provider Last Rate Last Admin   acetaminophen (TYLENOL) tablet 650 mg  650 mg Oral Q6H PRN Sarina Ill, DO       albuterol (VENTOLIN HFA) 108 (90 Base) MCG/ACT inhaler 2 puff  2 puff Inhalation Q6H PRN Sarina Ill, DO       alum & mag hydroxide-simeth (MAALOX/MYLANTA) 200-200-20 MG/5ML suspension 30 mL  30 mL Oral Q4H PRN Sarina Ill, DO       atorvastatin (LIPITOR) tablet 40 mg  40 mg Oral Daily Sarina Ill, DO   40 mg at 05/22/23 1016   bictegravir-emtricitabine-tenofovir AF (BIKTARVY) 50-200-25 MG per tablet 1 tablet  1 tablet Oral Daily Sarina Ill, DO   1 tablet at 05/22/23 1015   diphenhydrAMINE (BENADRYL) capsule 50 mg  50 mg Oral TID PRN Sarina Ill, DO       Or   diphenhydrAMINE (BENADRYL) injection 50 mg  50 mg Intramuscular TID PRN Sarina Ill, DO       escitalopram (LEXAPRO) tablet 5 mg  5 mg Oral Daily Sarina Ill, DO   5 mg at 05/22/23 1016   haloperidol (HALDOL) tablet 5 mg  5 mg Oral TID PRN Sarina Ill, DO       Or   haloperidol lactate (HALDOL) injection 5 mg  5 mg Intramuscular TID PRN Sarina Ill, DO       lisinopril (ZESTRIL) tablet 20 mg  20 mg Oral Daily Sarina Ill, DO   20 mg at 05/22/23 1016   And   hydrochlorothiazide (HYDRODIURIL) tablet 25 mg  25 mg Oral Daily Sarina Ill, DO   25 mg at 05/22/23 1016   hydrOXYzine (ATARAX) tablet 25 mg  25 mg Oral TID PRN Sarina Ill, DO       LORazepam  (ATIVAN) tablet 2 mg  2 mg Oral TID PRN Sarina Ill, DO       Or   LORazepam (ATIVAN) injection 2 mg  2 mg Intramuscular TID PRN Sarina Ill, DO       magnesium hydroxide (MILK OF MAGNESIA) suspension 30 mL  30 mL Oral Daily PRN Sarina Ill, DO       nicotine (NICODERM CQ - dosed in mg/24 hours) patch 14 mg  14 mg Transdermal Daily Sarina Ill, DO   14 mg at 05/22/23 1016   PTA Medications: Medications Prior to Admission  Medication Sig Dispense Refill Last Dose   albuterol (VENTOLIN HFA) 108 (90 Base) MCG/ACT inhaler Inhale 2 puffs into the lungs every 6 (six) hours as needed for wheezing or shortness of breath. 8 g 2    allopurinol (ZYLOPRIM) 100 MG tablet Take 1 tablet (100 mg total) by mouth daily. 30 tablet 2    atorvastatin (LIPITOR) 40 MG tablet Take 1 tablet (40 mg total) by mouth daily. 30 tablet 11    bictegravir-emtricitabine-tenofovir AF (BIKTARVY) 50-200-25 MG TABS tablet Take 1 tablet by mouth daily. 30 tablet 11    ipratropium-albuterol (DUONEB) 0.5-2.5 (3) MG/3ML SOLN Take 3 mLs by nebulization every 4 (four) hours as needed. 360 mL 3  lisinopril-hydrochlorothiazide (ZESTORETIC) 20-25 MG tablet Take by mouth.       Patient Stressors: Health problems   Substance abuse    Patient Strengths: Average or above average intelligence  Supportive family/friends   Treatment Modalities: Medication Management, Group therapy, Case management,  1 to 1 session with clinician, Psychoeducation, Recreational therapy.   Physician Treatment Plan for Primary Diagnosis: MDD (major depressive disorder), recurrent episode, severe (HCC) Long Term Goal(s):     Short Term Goals:    Medication Management: Evaluate patient's response, side effects, and tolerance of medication regimen.  Therapeutic Interventions: 1 to 1 sessions, Unit Group sessions and Medication administration.  Evaluation of Outcomes: Progressing  Physician Treatment Plan  for Secondary Diagnosis: Principal Problem:   MDD (major depressive disorder), recurrent episode, severe (HCC)  Long Term Goal(s):     Short Term Goals:       Medication Management: Evaluate patient's response, side effects, and tolerance of medication regimen.  Therapeutic Interventions: 1 to 1 sessions, Unit Group sessions and Medication administration.  Evaluation of Outcomes: Progressing   RN Treatment Plan for Primary Diagnosis: MDD (major depressive disorder), recurrent episode, severe (HCC) Long Term Goal(s): Knowledge of disease and therapeutic regimen to maintain health will improve  Short Term Goals: Ability to remain free from injury will improve, Ability to verbalize frustration and anger appropriately will improve, Ability to demonstrate self-control, Ability to participate in decision making will improve, Ability to verbalize feelings will improve, Ability to disclose and discuss suicidal ideas, Ability to identify and develop effective coping behaviors will improve, and Compliance with prescribed medications will improve  Medication Management: RN will administer medications as ordered by provider, will assess and evaluate patient's response and provide education to patient for prescribed medication. RN will report any adverse and/or side effects to prescribing provider.  Therapeutic Interventions: 1 on 1 counseling sessions, Psychoeducation, Medication administration, Evaluate responses to treatment, Monitor vital signs and CBGs as ordered, Perform/monitor CIWA, COWS, AIMS and Fall Risk screenings as ordered, Perform wound care treatments as ordered.  Evaluation of Outcomes: Progressing   LCSW Treatment Plan for Primary Diagnosis: MDD (major depressive disorder), recurrent episode, severe (HCC) Long Term Goal(s): Safe transition to appropriate next level of care at discharge, Engage patient in therapeutic group addressing interpersonal concerns.  Short Term Goals: Engage  patient in aftercare planning with referrals and resources, Increase social support, Increase ability to appropriately verbalize feelings, Increase emotional regulation, Facilitate acceptance of mental health diagnosis and concerns, Facilitate patient progression through stages of change regarding substance use diagnoses and concerns, Identify triggers associated with mental health/substance abuse issues, and Increase skills for wellness and recovery  Therapeutic Interventions: Assess for all discharge needs, 1 to 1 time with Social worker, Explore available resources and support systems, Assess for adequacy in community support network, Educate family and significant other(s) on suicide prevention, Complete Psychosocial Assessment, Interpersonal group therapy.  Evaluation of Outcomes: Progressing   Progress in Treatment: Attending groups: Yes. Participating in groups: Yes. Taking medication as prescribed: Yes. Toleration medication: Yes. Family/Significant other contact made: No, will contact:  CSW will contact if given permission  Patient understands diagnosis: Yes. Discussing patient identified problems/goals with staff: Yes. Medical problems stabilized or resolved: Yes. Denies suicidal/homicidal ideation: Yes. Issues/concerns per patient self-inventory: No. Other: None   New problem(s) identified: No, Describe:  none identified   New Short Term/Long Term Goal(s):  elimination of symptoms of psychosis, medication management for mood stabilization; elimination of SI thoughts; development of comprehensive mental wellness/sobriety plan.  Patient Goals:  "My goal is to be a better when I leave"   Discharge Plan or Barriers: CSW will assist with appropriate discharge planning   Reason for Continuation of Hospitalization: Anxiety Depression Medication stabilization  Estimated Length of Stay: 1 to 7 days   Last 3 [place] Suicide Severity Risk Score: Flowsheet Row Admission (Current)  from 05/21/2023 in [facility] INPATIENT BEHAVIORAL MEDICINE Most recent reading at 05/22/2023  6:00 AM ED from 05/21/2023 in [facility] Emergency Department at [facility] Most recent reading at 05/21/2023  4:53 PM ED from 12/21/2022 in [facility] Emergency Department at [facility] Most recent reading at 12/21/2022  2:58 PM  C-SSRS RISK CATEGORY High Risk High Risk No Risk       Last PHQ 2/9 Scores:    01/20/2023    9:54 AM 06/15/2022    3:00 PM 04/28/2022   10:29 AM  Depression screen PHQ 2/9  Decreased Interest 0 0 0  Down, Depressed, Hopeless 0 0 0  PHQ - 2 Score 0 0 0    Scribe for Treatment Team: Elza Rafter, Theresia Majors 05/22/2023 10:32 AM

## 2023-05-22 NOTE — Progress Notes (Signed)
[facility] MD Progress Note  05/22/2023 7:29 PM Aaron [name]  MRN:  [id number] Subjective:  47 year old African American male who reports, "I am not as sad as yesterday." He describes a slight improvement in mood since his last evaluation. The patient is engaged in treatment and has begun attending therapy sessions with the LCSW. He denies any new or worsening symptoms and appears motivated to continue with the current treatment plan. The patient is demonstrating a positive response with  the initiation of Lamictal 25 mg, with reported improvement in mood. He is actively participating in therapy sessions and appears motivated to engage in the treatment process. His mood has improved compared to the previous day, and he denies any acute psychiatric symptoms such as suicidal ideation, homicidal ideation, or psychosis. Principal Problem: MDD (major depressive disorder), recurrent episode, severe (HCC) Diagnosis: Principal Problem:   MDD (major depressive disorder), recurrent episode, severe (HCC)  Total Time spent with patient: 1 hour  Past Psychiatric History: Anxiety  Past Medical History:  Past Medical History:  Diagnosis Date   Asthma    Diabetes mellitus without complication (HCC)    Hypertension     Past Surgical History:  Procedure Laterality Date   COLONOSCOPY WITH PROPOFOL N/A 09/21/2022   Procedure: COLONOSCOPY WITH PROPOFOL;  Surgeon: Toney Reil, MD;  Location: [facility] ENDOSCOPY;  Service: Gastroenterology;  Laterality: N/A;   Family History: History reviewed. No pertinent family history. Family Psychiatric  History: none reported Social History:  Social History   Substance and Sexual Activity  Alcohol Use Yes     Social History   Substance and Sexual Activity  Drug Use Yes   Types: Marijuana    Social History   Socioeconomic History   Marital status: Single    Spouse name: Not on file   Number of children: Not on file   Years of education: Not on file    Highest education level: Not on file  Occupational History   Not on file  Tobacco Use   Smoking status: Former    Current packs/day: 1.00    Average packs/day: 1 pack/day for 25.8 years (25.8 ttl pk-yrs)    Types: Cigarettes    Start date: 1999   Smokeless tobacco: Never   Tobacco comments:    Quit 10/07/22  Vaping Use   Vaping status: Never Used  Substance and Sexual Activity   Alcohol use: Yes   Drug use: Yes    Types: Marijuana   Sexual activity: Not Currently    Partners: Male    Birth control/protection: Condom    Comment: declined condoms  Other Topics Concern   Not on file  Social History Narrative   Not on file   Social Determinants of Health   Financial Resource Strain: Not on file  Food Insecurity: No Food Insecurity (05/22/2023)   Hunger Vital Sign    Worried About Running Out of Food in the Last Year: Never true    Ran Out of Food in the Last Year: Never true  Transportation Needs: No Transportation Needs (05/22/2023)   PRAPARE - Administrator, Civil Service (Medical): No    Lack of Transportation (Non-Medical): No  Physical Activity: Not on file  Stress: Not on file  Social Connections: Not on file   Additional Social History:                         Sleep: Fair  Appetite:  Good  Current Medications:  Current Facility-Administered Medications  Medication Dose Route Frequency Provider Last Rate Last Admin   acetaminophen (TYLENOL) tablet 650 mg  650 mg Oral Q6H PRN Sarina Ill, DO       albuterol (VENTOLIN HFA) 108 (90 Base) MCG/ACT inhaler 2 puff  2 puff Inhalation Q6H PRN Sarina Ill, DO       alum & mag hydroxide-simeth (MAALOX/MYLANTA) 200-200-20 MG/5ML suspension 30 mL  30 mL Oral Q4H PRN Sarina Ill, DO       atorvastatin (LIPITOR) tablet 40 mg  40 mg Oral Daily Sarina Ill, DO   40 mg at 05/22/23 1016   bictegravir-emtricitabine-tenofovir AF (BIKTARVY) 50-200-25 MG per tablet  1 tablet  1 tablet Oral Daily Sarina Ill, DO   1 tablet at 05/22/23 1015   diphenhydrAMINE (BENADRYL) capsule 50 mg  50 mg Oral TID PRN Sarina Ill, DO       Or   diphenhydrAMINE (BENADRYL) injection 50 mg  50 mg Intramuscular TID PRN Sarina Ill, DO       [START ON 05/23/2023] escitalopram (LEXAPRO) tablet 10 mg  10 mg Oral Daily Myriam Forehand, NP       haloperidol (HALDOL) tablet 5 mg  5 mg Oral TID PRN Sarina Ill, DO       Or   haloperidol lactate (HALDOL) injection 5 mg  5 mg Intramuscular TID PRN Sarina Ill, DO       lisinopril (ZESTRIL) tablet 20 mg  20 mg Oral Daily Sarina Ill, DO   20 mg at 05/22/23 1016   And   hydrochlorothiazide (HYDRODIURIL) tablet 25 mg  25 mg Oral Daily Sarina Ill, DO   25 mg at 05/22/23 1016   hydrOXYzine (ATARAX) tablet 25 mg  25 mg Oral TID PRN Sarina Ill, DO       LORazepam (ATIVAN) tablet 2 mg  2 mg Oral TID PRN Sarina Ill, DO       Or   LORazepam (ATIVAN) injection 2 mg  2 mg Intramuscular TID PRN Sarina Ill, DO       magnesium hydroxide (MILK OF MAGNESIA) suspension 30 mL  30 mL Oral Daily PRN Sarina Ill, DO       nicotine (NICODERM CQ - dosed in mg/24 hours) patch 14 mg  14 mg Transdermal Daily Sarina Ill, DO   14 mg at 05/22/23 1016   traZODone (DESYREL) tablet 50 mg  50 mg Oral QHS Myriam Forehand, NP        Lab Results:  Results for orders placed or performed during the hospital encounter of 05/21/23 (from the past 48 hour(s))  Comprehensive metabolic panel     Status: Abnormal   Collection Time: 05/21/23  1:19 PM  Result Value Ref Range   Sodium 139 135 - 145 mmol/L   Potassium 3.2 (L) 3.5 - 5.1 mmol/L   Chloride 104 98 - 111 mmol/L   CO2 26 22 - 32 mmol/L   Glucose, Bld 123 (H) 70 - 99 mg/dL    Comment: Glucose reference range applies only to samples taken after fasting for at least 8 hours.    BUN 15 6 - 20 mg/dL   Creatinine, Ser 4.09 (H) 0.61 - 1.24 mg/dL   Calcium 9.0 8.9 - 81.1 mg/dL   Total Protein 8.3 (H) 6.5 - 8.1 g/dL   Albumin 4.3 3.5 - 5.0 g/dL   AST 30 15 - 41 U/L  ALT 30 0 - 44 U/L   Alkaline Phosphatase 39 38 - 126 U/L   Total Bilirubin 0.7 0.3 - 1.2 mg/dL   GFR, Estimated >08 >65 mL/min    Comment: (NOTE) Calculated using the CKD-EPI Creatinine Equation (2021)    Anion gap 9 5 - 15    Comment: Performed at [facility], [address] [place], [place] [address]  Ethanol     Status: None   Collection Time: 05/21/23  1:19 PM  Result Value Ref Range   Alcohol, Ethyl (B) <10 <10 mg/dL    Comment: (NOTE) Lowest detectable limit for serum alcohol is 10 mg/dL.  For medical purposes only. Performed at [facility], [address] [place], [place] [address]   Salicylate level     Status: Abnormal   Collection Time: 05/21/23  1:19 PM  Result Value Ref Range   Salicylate Lvl <7.0 (L) 7.0 - 30.0 mg/dL    Comment: Performed at [facility], [address] [place], [place] [address]  Acetaminophen level     Status: Abnormal   Collection Time: 05/21/23  1:19 PM  Result Value Ref Range   Acetaminophen (Tylenol), Serum <10 (L) 10 - 30 ug/mL    Comment: (NOTE) Therapeutic concentrations vary significantly. A range of 10-30 ug/mL  may be an effective concentration for many patients. However, some  are best treated at concentrations outside of this range. Acetaminophen concentrations >150 ug/mL at 4 hours after ingestion  and >50 ug/mL at 12 hours after ingestion are often associated with  toxic reactions.  Performed at [facility], [address] [place], [place] [address]   cbc     Status: None   Collection Time: 05/21/23  1:19 PM  Result Value Ref Range   WBC 5.6 4.0 - 10.5 K/uL   RBC 5.08 4.22 - 5.81 MIL/uL   Hemoglobin 14.5 13.0 - 17.0 g/dL   HCT 27.2 53.6 - 64.4 %   MCV 88.4 80.0 - 100.0 fL   MCH 28.5 26.0  - 34.0 pg   MCHC 32.3 30.0 - 36.0 g/dL   RDW 03.4 74.2 - 59.5 %   Platelets 369 150 - 400 K/uL   nRBC 0.0 0.0 - 0.2 %    Comment: Performed at [facility], [address], [place], [place] [address]  Urine Drug Screen, Qualitative     Status: Abnormal   Collection Time: 05/21/23  1:19 PM  Result Value Ref Range   Tricyclic, Ur Screen NONE DETECTED NONE DETECTED   Amphetamines, Ur Screen POSITIVE (A) NONE DETECTED   MDMA (Ecstasy)Ur Screen NONE DETECTED NONE DETECTED   Cocaine Metabolite,Ur [place] NONE DETECTED NONE DETECTED   Opiate, Ur Screen NONE DETECTED NONE DETECTED   Phencyclidine (PCP) Ur S NONE DETECTED NONE DETECTED   Cannabinoid 50 Ng, Ur [place] POSITIVE (A) NONE DETECTED   Barbiturates, Ur Screen NONE DETECTED NONE DETECTED   Benzodiazepine, Ur Scrn NONE DETECTED NONE DETECTED   Methadone Scn, Ur NONE DETECTED NONE DETECTED    Comment: (NOTE) Tricyclics + metabolites, urine    Cutoff 1000 ng/mL Amphetamines + metabolites, urine  Cutoff 1000 ng/mL MDMA (Ecstasy), urine              Cutoff 500 ng/mL Cocaine Metabolite, urine          Cutoff 300 ng/mL Opiate + metabolites, urine        Cutoff 300 ng/mL Phencyclidine (PCP), urine  Cutoff 25 ng/mL Cannabinoid, urine                 Cutoff 50 ng/mL Barbiturates + metabolites, urine  Cutoff 200 ng/mL Benzodiazepine, urine              Cutoff 200 ng/mL Methadone, urine                   Cutoff 300 ng/mL  The urine drug screen provides only a preliminary, unconfirmed analytical test result and should not be used for non-medical purposes. Clinical consideration and professional judgment should be applied to any positive drug screen result due to possible interfering substances. A more specific alternate chemical method must be used in order to obtain a confirmed analytical result. Gas chromatography / mass spectrometry (GC/MS) is the preferred confirm atory method. Performed at [facility], [address] [place], [place] [address]      Musculoskeletal: Strength & Muscle Tone: within normal limits Gait & Station: normal Patient leans: N/A  Psychiatric Specialty Exam:  Presentation  General Appearance:  Appropriate for Environment  Eye Contact: Good  Speech: Clear and Coherent  Speech Volume: Normal  Handedness: Right   Mood and Affect  Mood: Anxious  Affect: Appropriate   Thought Process  Thought Processes: Coherent  Descriptions of Associations:Intact  Orientation:Full (Time, Place and Person) (and situation)  Thought Content:WDL  History of Schizophrenia/Schizoaffective disorder:No  Duration of Psychotic Symptoms:none reported Hallucinations:Hallucinations: None  Ideas of Reference:None  Suicidal Thoughts:Suicidal Thoughts: No SI Active Intent and/or Plan: -- (none noted)  Homicidal Thoughts:Homicidal Thoughts: No   Sensorium  Memory: Immediate Good; Remote Good  Judgment: Good  Insight: Good   Executive Functions  Concentration: Good  Attention Span: Good  Recall: Good  Fund of Knowledge: Good  Language: Good   Psychomotor Activity  Psychomotor Activity: Psychomotor Activity: Normal   Assets  Assets: Housing; Manufacturing systems engineer; Desire for Improvement   Sleep  Sleep: Sleep: Good Number of Hours of Sleep: 8    Physical Exam: Physical Exam Vitals and nursing note reviewed.  Constitutional:      Appearance: Normal appearance.  HENT:     Head: Normocephalic and atraumatic.     Nose: Nose normal.  Pulmonary:     Effort: Pulmonary effort is normal.  Musculoskeletal:        General: Normal range of motion.     Cervical back: Normal range of motion.  Neurological:     General: No focal deficit present.     Mental Status: He is alert and oriented to person, place, and time.  Psychiatric:        Attention and Perception: Attention and perception normal.        Mood and Affect: Mood is depressed.  Affect is flat.        Speech: Speech normal.        Behavior: Behavior normal. Behavior is cooperative.        Thought Content: Thought content normal.        Cognition and Memory: Cognition and memory normal.        Judgment: Judgment normal.    Review of Systems  Psychiatric/Behavioral:  Positive for depression.   All other systems reviewed and are negative.  Blood pressure (!) 166/111, pulse 90, temperature 98.1 F (36.7 C), resp. rate 18, height 6' (1.829 m), weight 128.8 kg, SpO2 99%. Body mass index is 38.52 kg/m.   Treatment Plan Summary: Daily contact with patient to assess and  evaluate symptoms and progress in treatment and Medication management 1 Initate Lamictal 25 mg and monitor for effectiveness and any potential side effects. 2.Continue therapy sessions with the LCSW to address underlying issues related to mood and coping strategies. 3.Monitor mood and symptoms daily, ensuring continued improvement. 4.Reassess in follow-up for potential dose adjustments to Lamictal as needed for mood stabilization. 5.Encourage ongoing participation in group and individual therapy. 6.Maintain close communication with the treatment team to ensure alignment of psychiatric and therapeutic goals. Myriam Forehand, NP 05/22/2023, 7:29 PM

## 2023-05-22 NOTE — Group Note (Signed)
Date:  05/22/2023 Time:  6:23 PM  Group Topic/Focus:  Art Therapy  creating artwork, reflecting on the artwork, and connecting to personal insights. Creating artwork can help someone understand how they process their emotions    Participation Level:  Did Not Attend   Aaron [name] 05/22/2023, 6:23 PM

## 2023-05-22 NOTE — Group Note (Signed)
Recreation Therapy Group Note   Group Topic:Health and Wellness  Group Date: 05/22/2023 Start Time: 1030 End Time: 1130 Facilitators: Rosina Lowenstein, LRT, CTRS Location: Courtyard  Group Description: [employer]. LRT and patients played games of basketball, drew with chalk, and played corn hole while outside in the courtyard while getting fresh air and sunlight. Music was being played in the background. LRT and peers conversed about different games they have played before, what they do in their free time and anything else that is on their minds. LRT encouraged pts to drink water after being outside, sweating and getting their heart rate up.  Goal Area(s) Addressed: Patient will build on frustration tolerance skills. Patients will partake in a competitive play game with peers. Patients will gain knowledge of new leisure interest/hobby.    Affect/Mood: N/A   Participation Level: Did not attend    Clinical Observations/Individualized Feedback: At[place] did not attend group.  Plan: Continue to engage patient in RT group sessions 2-3x/week.   Rosina Lowenstein, LRT, CTRS 05/22/2023 12:29 PM

## 2023-05-22 NOTE — Group Note (Signed)
Date:  05/22/2023 Time:  10:07 AM  Group Topic/Focus:  Dimensions of Wellness:   The focus of this group is to introduce the topic of wellness and discuss the role each dimension of wellness plays in total health.    Participation Level:  Active  Participation Quality:  Appropriate and Attentive  Affect:  Appropriate  Cognitive:  Alert, Appropriate, and Oriented  Insight: Appropriate  Engagement in Group:  Developing/Improving and Engaged  Modes of Intervention:  Activity, Discussion, and Education  Additional Comments:    Rosaura Carpenter 05/22/2023, 10:07 AM

## 2023-05-22 NOTE — Plan of Care (Signed)
Patient newly admitted and requires stabilization of psychiatric symptoms.

## 2023-05-22 NOTE — Progress Notes (Signed)
Pt endorses depression to this RN. Pt politely refused scheduled trazadone, reports he usually sleeps okay. Pt encouraged to let staff know if this changes. Pt calm and cooperative with staff and other patients on the unit. Participated well in group. Patient denies any needs at this time, encouraged to let staff know if this changes.

## 2023-05-22 NOTE — Progress Notes (Signed)
   05/22/23 1639  Psych Admission Type (Psych Patients Only)  Admission Status Voluntary  Psychosocial Assessment  Patient Complaints Depression  Eye Contact Fair  Facial Expression Flat  Affect Flat  Speech Logical/coherent  Interaction Assertive  Motor Activity Slow  Appearance/Hygiene In scrubs  Behavior Characteristics Cooperative  Mood Anxious  Thought Process  Coherency WDL  Content WDL  Delusions WDL  Perception WDL  Hallucination None reported or observed  Judgment WDL  Confusion WDL  Danger to Self  Current suicidal ideation? Denies  Danger to Others  Danger to Others None reported or observed

## 2023-05-22 NOTE — Group Note (Signed)
Date:  05/22/2023 Time:  9:26 PM  Group Topic/Focus:  Wellness Toolbox:   The focus of this group is to discuss various aspects of wellness, balancing those aspects and exploring ways to increase the ability to experience wellness.  Patients will create a wellness toolbox for use upon discharge. Wrap-Up Group:   The focus of this group is to help patients review their daily goal of treatment and discuss progress on daily workbooks.    Participation Level:  Active  Participation Quality:  Appropriate and Attentive  Affect:  Appropriate  Cognitive:  Alert and Appropriate  Insight: Appropriate and Good  Engagement in Group:  Engaged  Modes of Intervention:  Discussion  Additional Comments:     Maglione,Matilynn Dacey E 05/22/2023, 9:26 PM

## 2023-05-22 NOTE — Tx Team (Signed)
Initial Treatment Plan 05/22/2023 6:11 AM [name] [id number]    PATIENT STRESSORS: Health problems   Substance abuse     PATIENT STRENGTHS: Average or above average intelligence  Supportive family/friends    PATIENT IDENTIFIED PROBLEMS: Suicidal Thoughts  Depression                   DISCHARGE CRITERIA:  Improved stabilization in mood, thinking, and/or behavior  PRELIMINARY DISCHARGE PLAN: Return to previous living arrangement  PATIENT/FAMILY INVOLVEMENT: This treatment plan has been presented to and reviewed with the patient, Aaron [name], and/or family member,   The patient and family have been given the opportunity to ask questions and make suggestions.  Aaron Ewings, RN 05/22/2023, 6:11 AM

## 2023-05-22 NOTE — [facility] Suicide Risk Assessment (Signed)
[facility] Admission Suicide Risk Assessment   Nursing information obtained from:  Patient Demographic factors:  Male, Adolescent or young adult, Low socioeconomic status Current Mental Status:  NA Loss Factors:  Decline in physical health Historical Factors:  Family history of mental illness or substance abuse, Impulsivity Risk Reduction Factors:  Positive therapeutic relationship, Positive coping skills or problem solving skills, Positive social support  Total Time spent with patient: 2 hours Principal Problem: MDD (major depressive disorder), recurrent episode, severe (HCC) Diagnosis:  Principal Problem:   MDD (major depressive disorder), recurrent episode, severe (HCC)  Subjective Data: 47 year old African American male, reports feeling worthlessness, hopelessness, and despair. He has had suicidal ideations for the past month, and earlier today, he intended to walk into traffic to end his life but decided to come to the emergency department instead.During treatment team meeting patient reports mother's cancer diagnosis and his own chronic medical conditions as contributing stressors.patient reports self-medicating with daily marijuana use and also admits to using methamphetamine.The patient presents with severe depression, active suicidal ideation with plan and intent, and a history of two prior suicide attempts. His current suicidal thoughts are compounded by psychosocial stressors, including his mother's cancer and his own chronic medical conditions. Substance use complicates the clinical picture, but the patient denies hallucinations, delusions, or homicidal ideation.  CLINICAL FACTORS:   Depression:   Anhedonia Insomnia Alcohol/Substance Abuse/Dependencies More than one psychiatric diagnosis Previous Psychiatric Diagnoses and Treatments Medical Diagnoses and Treatments/Surgeries   Musculoskeletal: Strength & Muscle Tone: within normal limits Gait & Station: normal Patient leans:  N/A  Psychiatric Specialty Exam:  Presentation  General Appearance:  Appropriate for Environment  Eye Contact: Good  Speech: Clear and Coherent  Speech Volume: Normal  Handedness: Right   Mood and Affect  Mood: Anxious  Affect: Appropriate   Thought Process  Thought Processes: Coherent  Descriptions of Associations:Intact  Orientation:Full (Time, Place and Person) (and situation)  Thought Content:WDL  History of Schizophrenia/Schizoaffective disorder:No  Duration of Psychotic Symptoms:none  Hallucinations:Hallucinations: None  Ideas of Reference:None  Suicidal Thoughts:Suicidal Thoughts: No SI Active Intent and/or Plan: -- (none noted)  Homicidal Thoughts:Homicidal Thoughts: No   Sensorium  Memory: Immediate Good; Remote Good  Judgment: Good  Insight: Good   Executive Functions  Concentration: Good  Attention Span: Good  Recall: Good  Fund of Knowledge: Good  Language: Good   Psychomotor Activity  Psychomotor Activity: Psychomotor Activity: Normal   Assets  Assets: Housing; Manufacturing systems engineer; Desire for Improvement   Sleep  Sleep: Sleep: Good Number of Hours of Sleep: 8    Physical Exam: Physical Exam Vitals and nursing note reviewed.  Constitutional:      Appearance: Normal appearance.  HENT:     Head: Normocephalic and atraumatic.     Nose: Nose normal.  Pulmonary:     Effort: Pulmonary effort is normal.  Musculoskeletal:        General: Normal range of motion.     Cervical back: Normal range of motion.  Neurological:     General: No focal deficit present.     Mental Status: He is alert and oriented to person, place, and time.  Psychiatric:        Attention and Perception: Attention and perception normal.        Mood and Affect: Mood is anxious and depressed.        Speech: Speech normal.        Behavior: Behavior is cooperative.        Thought Content: Thought  content normal.        Cognition  and Memory: Cognition and memory normal.        Judgment: Judgment normal.    Review of Systems  Psychiatric/Behavioral:  Positive for depression and substance abuse. The patient is nervous/anxious and has insomnia.    Blood pressure (!) 166/111, pulse 90, temperature 98.1 F (36.7 C), resp. rate 18, height 6' (1.829 m), weight 128.8 kg, SpO2 99%. Body mass index is 38.52 kg/m.   COGNITIVE FEATURES THAT CONTRIBUTE TO RISK:  None    SUICIDE RISK:   Mild:  Suicidal ideation of limited frequency, intensity, duration, and specificity.  There are no identifiable plans, no associated intent, mild dysphoria and related symptoms, good self-control (both objective and subjective assessment), few other risk factors, and identifiable protective factors, including available and accessible social support.  PLAN OF CARE:  1.Initiate Lexapro 10 mg daily to address depressive symptoms. This is an initial dose to assess tolerance and response before considering any necessary titration. 2.Initiate Trazodone 50 mg nightly for sleep disturbances and to support better nighttime rest. 3.Continue monitoring for any side effects of both medications and assess for any impact on mood, sleep, and overall functionality. 4.Encourage supportive counseling to build emotional resilience and offer tools to manage his depressive episodes. 5.Continue close monitoring of suicidal ideation given his history of serious attempts and the current risk of relapse. 6.Continue to collaborate with social work and coordinate care for his chronic medical conditions, including HIV, diabetes, and other ongoing health needs.   I certify that inpatient services furnished can reasonably be expected to improve the patient's condition.   Myriam Forehand, NP 05/22/2023, 7:20 PM

## 2023-05-22 NOTE — Progress Notes (Signed)
Patient admitted from ED under voluntary commitment due to suicidal ideation, he reports many issues with his health, and his mother was recently diagnosed with cancer.  He has had previous suicide attempts in the past. He is Ox4, he currently denies SI, HI & AVH. He still endorses depression although. He was cooperative with treatment on the shift and he seemed to sleep well through out the night.

## 2023-05-23 MED ORDER — LAMOTRIGINE 25 MG PO TABS
25.0000 mg | ORAL_TABLET | Freq: Two times a day (BID) | ORAL | Status: DC
  Administered 2023-05-23 – 2023-05-24 (×3): 25 mg via ORAL
  Filled 2023-05-23 (×3): qty 1

## 2023-05-23 NOTE — Group Note (Signed)
LCSW Group Therapy Note  Group Date: 05/23/2023 Start Time: 1330 End Time: 1430   Type of Therapy and Topic:  Group Therapy: Anger Cues and Responses  Participation Level:  Did Not Attend   Description of Group:   In this group, patients learned how to recognize the physical, cognitive, emotional, and behavioral responses they have to anger-provoking situations.  They identified a recent time they became angry and how they reacted.  They analyzed how their reaction was possibly beneficial and how it was possibly unhelpful.  The group discussed a variety of healthier coping skills that could help with such a situation in the future.  Focus was placed on how helpful it is to recognize the underlying emotions to our anger, because working on those can lead to a more permanent solution as well as our ability to focus on the important rather than the urgent.  Therapeutic Goals: Patients will remember their last incident of anger and how they felt emotionally and physically, what their thoughts were at the time, and how they behaved. Patients will identify how their behavior at that time worked for them, as well as how it worked against them. Patients will explore possible new behaviors to use in future anger situations. Patients will learn that anger itself is normal and cannot be eliminated, and that healthier reactions can assist with resolving conflict rather than worsening situations.  Summary of Patient Progress:  X  Therapeutic Modalities:   Cognitive Behavioral Therapy    Harden Mo, LCSWA 05/23/2023  3:35 PM

## 2023-05-23 NOTE — Group Note (Signed)
Recreation Therapy Group Note   Group Topic:Goal Setting  Group Date: 05/23/2023 Start Time: 1000 End Time: 1100 Facilitators: Rosina Lowenstein, LRT, CTRS Location:  Craft Room  Group Description: Scientist, research (physical sciences). Patients were given many different magazines, a glue stick, markers, and a piece of cardstock paper. LRT and pts discussed the importance of having goals in life. LRT and pts discussed the difference between short-term and long-term goals, as well as what a SMART goal is. LRT encouraged pts to create a vision board, with images they picked and then cut out with safety scissors from the magazine, for themselves, that capture their short and long-term goals. LRT encouraged pts to show and explain their vision board to the group.   Goal Area(s) Addressed:  Patient will gain knowledge of short vs. long term goals.  Patient will identify goals for themselves. Patient will practice setting SMART goals. Patient will verbalize their goals to LRT and peers.   Affect/Mood: N/A   Participation Level: Did not attend    Clinical Observations/Individualized Feedback: Aaron [name] did not attend group.  Plan: Continue to engage patient in RT group sessions 2-3x/week.   Rosina Lowenstein, LRT, CTRS 05/23/2023 11:39 AM

## 2023-05-23 NOTE — Group Note (Signed)
Date:  05/23/2023 Time:  9:00 PM  Group Topic/Focus:  Wrap-Up Group:   The focus of this group is to help patients review their daily goal of treatment and discuss progress on daily workbooks.    Participation Level:  Active  Participation Quality:  Appropriate, Attentive, and Supportive  Affect:  Appropriate  Cognitive:  Alert, Appropriate, and Oriented  Insight: Appropriate and Good  Engagement in Group:  Engaged  Modes of Intervention:  Discussion and Support  Additional Comments:     [name],Aaron [name] 05/23/2023, 9:00 PM

## 2023-05-23 NOTE — Group Note (Signed)
Date:  05/23/2023 Time:  12:46 PM  Group Topic/Focus: Mindfulness is a practice that involves being aware of the present moment and directing your attention inward. It can help you focus your mind, especially when you're feeling distracted, overwhelmed, or stressed.     Participation Level:  Did Not Attend   Rosaura Carpenter 05/23/2023, 12:46 PM

## 2023-05-23 NOTE — H&P (Signed)
Psychiatric Admission Assessment Adult  Patient Identification: Aaron [name] MRN:  [id number] Date of Evaluation:  05/23/2023 Chief Complaint:  MDD (major depressive disorder), recurrent episode, severe (HCC) [F33.2] Principal Diagnosis: MDD (major depressive disorder), recurrent episode, severe (HCC) Diagnosis:  Principal Problem:   MDD (major depressive disorder), recurrent episode, severe (HCC)  History of Present Illness: 47 year old African American male, reports feeling worthlessness, hopelessness, and despair. He has had suicidal ideations for the past month, and earlier today, he intended to walk into traffic to end his life but decided to come to the emergency department instead.During treatment team meeting patient reports mother's cancer diagnosis and his own chronic medical conditions as contributing stressors.patient reports self-medicating with daily marijuana use and also admits to using methamphetamine.The patient presents with severe depression, active suicidal ideation with plan and intent, and a history of two prior suicide attempts. His current suicidal thoughts are compounded by psychosocial stressors, including his mother's cancer and his own chronic medical conditions. Substance use complicates the clinical picture, but the patient denies hallucinations, delusions, or homicidal ideation.   Associated Signs/Symptoms: Depression Symptoms:  insomnia, feelings of worthlessness/guilt, difficulty concentrating, hopelessness, suicidal thoughts with specific plan, anxiety, loss of energy/fatigue, (Hypo) Manic Symptoms:  Impulsivity, Anxiety Symptoms:  Excessive Worry, Psychotic Symptoms:   None Noted PTSD Symptoms: Negative Total Time spent with patient: 3 hours  Past Psychiatric History: Depression, Anxiety  Is the patient at risk to self? Yes.    Has the patient been a risk to self in the past 6 months? Yes.    Has the patient been a risk to self within the  distant past? Yes.    Is the patient a risk to others? No.  Has the patient been a risk to others in the past 6 months? No.  Has the patient been a risk to others within the distant past? No.   [place] Scale:  Flowsheet Row Admission (Current) from 05/21/2023 in [facility] INPATIENT BEHAVIORAL MEDICINE Most recent reading at 05/22/2023  6:00 AM ED from 05/21/2023 in [facility] Emergency Department at [facility] Most recent reading at 05/21/2023  4:53 PM ED from 12/21/2022 in [facility] Emergency Department at [facility] Most recent reading at 12/21/2022  2:58 PM  C-SSRS RISK CATEGORY High Risk High Risk No Risk        Prior Inpatient Therapy: Yes.   If yes, describe inpatient  Prior Outpatient Therapy: No. If yes, describe    Alcohol Screening: 1. How often do you have a drink containing alcohol?: Never 2. How many drinks containing alcohol do you have on a typical day when you are drinking?: 1 or 2 3. How often do you have six or more drinks on one occasion?: Never AUDIT-C Score: 0 4. How often during the last year have you found that you were not able to stop drinking once you had started?: Never 5. How often during the last year have you failed to do what was normally expected from you because of drinking?: Never 6. How often during the last year have you needed a first drink in the morning to get yourself going after a heavy drinking session?: Never 7. How often during the last year have you had a feeling of guilt of remorse after drinking?: Never 8. How often during the last year have you been unable to remember what happened the night before because you had been drinking?: Never 9. Have you or someone else been injured as a result of your drinking?: No  10. Has a relative or friend or a doctor or another health worker been concerned about your drinking or suggested you cut down?: No Alcohol Use Disorder Identification Test Final Score (AUDIT): 0 Alcohol Brief  Interventions/Follow-up: Alcohol education/Brief advice Substance Abuse History in the last 12 months:  Yes.   Consequences of Substance Abuse: Medical Consequences:    Previous Psychotropic Medications: Yes  Psychological Evaluations: Yes  Past Medical History:  Past Medical History:  Diagnosis Date   Asthma    Diabetes mellitus without complication (HCC)    Hypertension     Past Surgical History:  Procedure Laterality Date   COLONOSCOPY WITH PROPOFOL N/A 09/21/2022   Procedure: COLONOSCOPY WITH PROPOFOL;  Surgeon: Toney Reil, MD;  Location: [facility] ENDOSCOPY;  Service: Gastroenterology;  Laterality: N/A;   Family History: History reviewed. No pertinent family history. Family Psychiatric  History: none noted Tobacco Screening:  Social History   Tobacco Use  Smoking Status Former   Current packs/day: 1.00   Average packs/day: 1 pack/day for 25.8 years (25.8 ttl pk-yrs)   Types: Cigarettes   Start date: 1999  Smokeless Tobacco Never  Tobacco Comments   Quit 10/07/22    BH Tobacco Counseling     Are you interested in Tobacco Cessation Medications?  Yes, implement Nicotene Replacement Protocol Counseled patient on smoking cessation:  No value filed. Reason Tobacco Screening Not Completed: No value filed.       Social History:  Social History   Substance and Sexual Activity  Alcohol Use Yes     Social History   Substance and Sexual Activity  Drug Use Yes   Types: Marijuana    Additional Social History:                           Allergies:   Allergies  Allergen Reactions   Levaquin [Levofloxacin] Itching and Rash   Lab Results:  Results for orders placed or performed during the hospital encounter of 05/21/23 (from the past 48 hour(s))  Comprehensive metabolic panel     Status: Abnormal   Collection Time: 05/21/23  1:19 PM  Result Value Ref Range   Sodium 139 135 - 145 mmol/L   Potassium 3.2 (L) 3.5 - 5.1 mmol/L   Chloride 104 98 - 111  mmol/L   CO2 26 22 - 32 mmol/L   Glucose, Bld 123 (H) 70 - 99 mg/dL    Comment: Glucose reference range applies only to samples taken after fasting for at least 8 hours.   BUN 15 6 - 20 mg/dL   Creatinine, Ser 1.88 (H) 0.61 - 1.24 mg/dL   Calcium 9.0 8.9 - 41.6 mg/dL   Total Protein 8.3 (H) 6.5 - 8.1 g/dL   Albumin 4.3 3.5 - 5.0 g/dL   AST 30 15 - 41 U/L   ALT 30 0 - 44 U/L   Alkaline Phosphatase 39 38 - 126 U/L   Total Bilirubin 0.7 0.3 - 1.2 mg/dL   GFR, Estimated >60 >63 mL/min    Comment: (NOTE) Calculated using the CKD-EPI Creatinine Equation (2021)    Anion gap 9 5 - 15    Comment: Performed at [facility], [address] [place], [place] [address]  Ethanol     Status: None   Collection Time: 05/21/23  1:19 PM  Result Value Ref Range   Alcohol, Ethyl (B) <10 <10 mg/dL    Comment: (NOTE) Lowest detectable limit for serum alcohol is 10  mg/dL.  For medical purposes only. Performed at [facility], [address] [place], [place] [address]   Salicylate level     Status: Abnormal   Collection Time: 05/21/23  1:19 PM  Result Value Ref Range   Salicylate Lvl <7.0 (L) 7.0 - 30.0 mg/dL    Comment: Performed at [facility], [address] [place], [place] [address]  Acetaminophen level     Status: Abnormal   Collection Time: 05/21/23  1:19 PM  Result Value Ref Range   Acetaminophen (Tylenol), Serum <10 (L) 10 - 30 ug/mL    Comment: (NOTE) Therapeutic concentrations vary significantly. A range of 10-30 ug/mL  may be an effective concentration for many patients. However, some  are best treated at concentrations outside of this range. Acetaminophen concentrations >150 ug/mL at 4 hours after ingestion  and >50 ug/mL at 12 hours after ingestion are often associated with  toxic reactions.  Performed at [facility], [address] [place], [place] [address]   cbc     Status: None   Collection Time: 05/21/23  1:19 PM  Result Value  Ref Range   WBC 5.6 4.0 - 10.5 K/uL   RBC 5.08 4.22 - 5.81 MIL/uL   Hemoglobin 14.5 13.0 - 17.0 g/dL   HCT 91.4 78.2 - 95.6 %   MCV 88.4 80.0 - 100.0 fL   MCH 28.5 26.0 - 34.0 pg   MCHC 32.3 30.0 - 36.0 g/dL   RDW 21.3 08.6 - 57.8 %   Platelets 369 150 - 400 K/uL   nRBC 0.0 0.0 - 0.2 %    Comment: Performed at [facility], [address] [place], [place] [address]  Urine Drug Screen, Qualitative     Status: Abnormal   Collection Time: 05/21/23  1:19 PM  Result Value Ref Range   Tricyclic, Ur Screen NONE DETECTED NONE DETECTED   Amphetamines, Ur Screen POSITIVE (A) NONE DETECTED   MDMA (Ecstasy)Ur Screen NONE DETECTED NONE DETECTED   Cocaine Metabolite,Ur [place] NONE DETECTED NONE DETECTED   Opiate, Ur Screen NONE DETECTED NONE DETECTED   Phencyclidine (PCP) Ur S NONE DETECTED NONE DETECTED   Cannabinoid 50 Ng, Ur [place] POSITIVE (A) NONE DETECTED   Barbiturates, Ur Screen NONE DETECTED NONE DETECTED   Benzodiazepine, Ur Scrn NONE DETECTED NONE DETECTED   Methadone Scn, Ur NONE DETECTED NONE DETECTED    Comment: (NOTE) Tricyclics + metabolites, urine    Cutoff 1000 ng/mL Amphetamines + metabolites, urine  Cutoff 1000 ng/mL MDMA (Ecstasy), urine              Cutoff 500 ng/mL Cocaine Metabolite, urine          Cutoff 300 ng/mL Opiate + metabolites, urine        Cutoff 300 ng/mL Phencyclidine (PCP), urine         Cutoff 25 ng/mL Cannabinoid, urine                 Cutoff 50 ng/mL Barbiturates + metabolites, urine  Cutoff 200 ng/mL Benzodiazepine, urine              Cutoff 200 ng/mL Methadone, urine                   Cutoff 300 ng/mL  The urine drug screen provides only a preliminary, unconfirmed analytical test result and should not be used for non-medical purposes. Clinical consideration and professional judgment should be applied to any positive drug screen result due  to possible interfering substances. A more specific alternate chemical method must be used in order to  obtain a confirmed analytical result. Gas chromatography / mass spectrometry (GC/MS) is the preferred confirm atory method. Performed at [facility], [address] [place], [place] [address]     Blood Alcohol level:  Lab Results  Component Value Date   [facility] <10 05/21/2023   ETH <10 04/04/2020    Metabolic Disorder Labs:  Lab Results  Component Value Date   HGBA1C 6.5 (H) 09/23/2022   MPG 139.85 09/23/2022   MPG 139.85 02/15/2022   No results found for: "PROLACTIN" No results found for: "CHOL", "TRIG", "HDL", "CHOLHDL", "VLDL", "LDLCALC"  Current Medications: Current Facility-Administered Medications  Medication Dose Route Frequency Provider Last Rate Last Admin   acetaminophen (TYLENOL) tablet 650 mg  650 mg Oral Q6H PRN Sarina Ill, DO       albuterol (VENTOLIN HFA) 108 (90 Base) MCG/ACT inhaler 2 puff  2 puff Inhalation Q6H PRN Sarina Ill, DO       alum & mag hydroxide-simeth (MAALOX/MYLANTA) 200-200-20 MG/5ML suspension 30 mL  30 mL Oral Q4H PRN Sarina Ill, DO       atorvastatin (LIPITOR) tablet 40 mg  40 mg Oral Daily Sarina Ill, DO   40 mg at 05/23/23 0809   bictegravir-emtricitabine-tenofovir AF (BIKTARVY) 50-200-25 MG per tablet 1 tablet  1 tablet Oral Daily Sarina Ill, DO   1 tablet at 05/23/23 0809   diphenhydrAMINE (BENADRYL) capsule 50 mg  50 mg Oral TID PRN Sarina Ill, DO       Or   diphenhydrAMINE (BENADRYL) injection 50 mg  50 mg Intramuscular TID PRN Sarina Ill, DO       escitalopram (LEXAPRO) tablet 10 mg  10 mg Oral Daily Myriam Forehand, NP   10 mg at 05/23/23 0809   haloperidol (HALDOL) tablet 5 mg  5 mg Oral TID PRN Sarina Ill, DO       Or   haloperidol lactate (HALDOL) injection 5 mg  5 mg Intramuscular TID PRN Sarina Ill, DO       lisinopril (ZESTRIL) tablet 20 mg  20 mg Oral Daily Sarina Ill, DO   20 mg at 05/23/23  1914   And   hydrochlorothiazide (HYDRODIURIL) tablet 25 mg  25 mg Oral Daily Sarina Ill, DO   25 mg at 05/23/23 0809   hydrOXYzine (ATARAX) tablet 25 mg  25 mg Oral TID PRN Sarina Ill, DO       LORazepam (ATIVAN) tablet 2 mg  2 mg Oral TID PRN Sarina Ill, DO       Or   LORazepam (ATIVAN) injection 2 mg  2 mg Intramuscular TID PRN Sarina Ill, DO       magnesium hydroxide (MILK OF MAGNESIA) suspension 30 mL  30 mL Oral Daily PRN Sarina Ill, DO       nicotine (NICODERM CQ - dosed in mg/24 hours) patch 14 mg  14 mg Transdermal Daily Sarina Ill, DO   14 mg at 05/22/23 1016   traZODone (DESYREL) tablet 50 mg  50 mg Oral QHS Myriam Forehand, NP       PTA Medications: Medications Prior to Admission  Medication Sig Dispense Refill Last Dose   albuterol (VENTOLIN HFA) 108 (90 Base) MCG/ACT inhaler Inhale 2 puffs into the lungs every 6 (six) hours as needed for wheezing or shortness of  breath. 8 g 2    allopurinol (ZYLOPRIM) 100 MG tablet Take 1 tablet (100 mg total) by mouth daily. 30 tablet 2    atorvastatin (LIPITOR) 40 MG tablet Take 1 tablet (40 mg total) by mouth daily. 30 tablet 11    bictegravir-emtricitabine-tenofovir AF (BIKTARVY) 50-200-25 MG TABS tablet Take 1 tablet by mouth daily. 30 tablet 11    ipratropium-albuterol (DUONEB) 0.5-2.5 (3) MG/3ML SOLN Take 3 mLs by nebulization every 4 (four) hours as needed. 360 mL 3    lisinopril-hydrochlorothiazide (ZESTORETIC) 20-25 MG tablet Take by mouth.       Musculoskeletal: Strength & Muscle Tone: within normal limits Gait & Station: normal Patient leans: N/A            Psychiatric Specialty Exam:  Presentation  General Appearance:  Appropriate for Environment  Eye Contact: Good  Speech: Clear and Coherent  Speech Volume: Normal  Handedness: Right   Mood and Affect  Mood: Anxious  Affect: Appropriate   Thought Process  Thought  Processes: Coherent  Duration of Psychotic Symptoms: none noted Past Diagnosis of Schizophrenia or Psychoactive disorder: No  Descriptions of Associations:Intact  Orientation:Full (Time, Place and Person) (and situation)  Thought Content:WDL  Hallucinations:Hallucinations: None  Ideas of Reference:None  Suicidal Thoughts:Suicidal Thoughts: No SI Active Intent and/or Plan: -- (none noted)  Homicidal Thoughts:Homicidal Thoughts: No   Sensorium  Memory: Immediate Good; Remote Good  Judgment: Good  Insight: Good   Executive Functions  Concentration: Good  Attention Span: Good  Recall: Good  Fund of Knowledge: Good  Language: Good   Psychomotor Activity  Psychomotor Activity: Psychomotor Activity: Normal   Assets  Assets: Housing; Manufacturing systems engineer; Desire for Improvement   Sleep  Sleep: Sleep: Good Number of Hours of Sleep: 8    Physical Exam: Physical Exam ROS Blood pressure (!) 139/90, pulse 70, temperature (!) 97.4 F (36.3 C), resp. rate 17, height 6' (1.829 m), weight 128.8 kg, SpO2 98%. Body mass index is 38.52 kg/m.  Treatment Plan Summary: Daily contact with patient to assess and evaluate symptoms and progress in treatment and Medication management 1.Initiate Lexapro 10 mg daily to address depressive symptoms. This is an initial dose to assess tolerance and response before considering any necessary titration. 2.Initiate Trazodone 50 mg nightly for sleep disturbances and to support better nighttime rest. 3.Continue monitoring for any side effects of both medications and assess for any impact on mood, sleep, and overall functionality. 4.Encourage supportive counseling to build emotional resilience and offer tools to manage his depressive episodes. 5.Continue close monitoring of suicidal ideation given his history of serious attempts and the current risk of relapse. 6.Continue to collaborate with social work and coordinate care for  his chronic medical conditions, including HIV, diabetes, and other ongoing health needs. Observation Level/Precautions:  Continuous Observation 15 minute checks  Laboratory:   none at this time  Psychotherapy:    Medications:  Lexapro 10mg  daily   Consultations:    Discharge Concerns:    Estimated LOS:  Other:     Physician Treatment Plan for Primary Diagnosis: MDD (major depressive disorder), recurrent episode, severe (HCC) Long Term Goal(s): Improvement in symptoms so as ready for discharge  Short Term Goals: Ability to identify changes in lifestyle to reduce recurrence of condition will improve, Ability to verbalize feelings will improve, Ability to disclose and discuss suicidal ideas, Ability to demonstrate self-control will improve, Ability to identify and develop effective coping behaviors will improve, Ability to maintain clinical measurements within normal limits will  improve, Compliance with prescribed medications will improve, and Ability to identify triggers associated with substance abuse/mental health issues will improve  Physician Treatment Plan for Secondary Diagnosis: Principal Problem:   MDD (major depressive disorder), recurrent episode, severe (HCC)  Long Term Goal(s): Improvement in symptoms so as ready for discharge  Short Term Goals: Ability to identify changes in lifestyle to reduce recurrence of condition will improve, Ability to verbalize feelings will improve, Ability to disclose and discuss suicidal ideas, Ability to demonstrate self-control will improve, Ability to identify and develop effective coping behaviors will improve, Ability to maintain clinical measurements within normal limits will improve, Compliance with prescribed medications will improve, and Ability to identify triggers associated with substance abuse/mental health issues will improve  I certify that inpatient services furnished can reasonably be expected to improve the patient's condition.    Myriam Forehand, NP 10/15/20249:56 AM

## 2023-05-23 NOTE — Plan of Care (Signed)
  Problem: Education: Goal: Knowledge of General Education information will improve Description: Including pain rating scale, medication(s)/side effects and non-pharmacologic comfort measures Outcome: Progressing   Problem: Health Behavior/Discharge Planning: Goal: Ability to manage health-related needs will improve Outcome: Progressing   Problem: Clinical Measurements: Goal: Will remain free from infection Outcome: Progressing   

## 2023-05-23 NOTE — Progress Notes (Signed)
Patient A&Ox4. Flat/depressed and minimal. Patient is med compliant and denies SI,HI, and A/V/H with no plan or intent. Patient is cooperative on unit and denies any pain or discomfort.    05/23/23 0830  Psych Admission Type (Psych Patients Only)  Admission Status Voluntary  Psychosocial Assessment  Patient Complaints None  Eye Contact Fair  Facial Expression Flat  Affect Flat  Speech Logical/coherent  Interaction Assertive  Motor Activity Slow  Appearance/Hygiene Unremarkable  Behavior Characteristics Cooperative;Appropriate to situation  Mood Depressed  Thought Process  Coherency WDL  Content WDL  Delusions None reported or observed  Perception WDL  Hallucination None reported or observed  Judgment WDL  Confusion None  Danger to Self  Current suicidal ideation? Denies  Danger to Others  Danger to Others None reported or observed

## 2023-05-23 NOTE — Group Note (Signed)
Date:  05/23/2023 Time:  6:17 PM  Group Topic/Focus:  OUTDOOR RECREATION THERAPY- A therapeutic process that involves activities in nature to improve mental, physical, and emotional well-being.    Participation Level:  Did Not Attend  Participation Quality:  Appropriate  Affect:  Appropriate  Cognitive:  Appropriate  Insight: Appropriate  Engagement in Group:  Developing/Improving  Modes of Intervention:  Activity  Additional Comments:    Avon Molock 05/23/2023, 6:17 PM

## 2023-05-23 NOTE — Plan of Care (Signed)
  Problem: Safety: Goal: Ability to remain free from injury will improve Outcome: Progressing   Problem: Education: Goal: Verbalization of understanding the information provided will improve Outcome: Progressing   Problem: Health Behavior/Discharge Planning: Goal: Compliance with treatment plan for underlying cause of condition will improve Outcome: Progressing

## 2023-05-24 LAB — URIC ACID: Uric Acid, Serum: 9.5 mg/dL — ABNORMAL HIGH (ref 3.7–8.6)

## 2023-05-24 MED ORDER — HYDROXYZINE HCL 25 MG PO TABS: 25.0000 mg | ORAL_TABLET | Freq: Three times a day (TID) | ORAL | 0 refills | Status: AC | PRN

## 2023-05-24 MED ORDER — ESCITALOPRAM OXALATE 10 MG PO TABS: 20.0000 mg | ORAL_TABLET | Freq: Every day | ORAL | Status: DC

## 2023-05-24 MED ORDER — TRAZODONE HCL 50 MG PO TABS: 50.0000 mg | ORAL_TABLET | Freq: Every day | ORAL | 0 refills | Status: AC

## 2023-05-24 MED ORDER — COLCHICINE 0.6 MG PO TABS: 0.3000 mg | ORAL_TABLET | Freq: Every day | ORAL | 0 refills | Status: AC

## 2023-05-24 MED ORDER — LAMOTRIGINE 25 MG PO TABS: 25.0000 mg | ORAL_TABLET | Freq: Two times a day (BID) | ORAL | 0 refills | Status: AC

## 2023-05-24 MED ORDER — ESCITALOPRAM OXALATE 20 MG PO TABS: 20.0000 mg | ORAL_TABLET | Freq: Every day | ORAL | 0 refills | Status: AC

## 2023-05-24 MED ORDER — NICOTINE 14 MG/24HR TD PT24: 14.0000 mg | MEDICATED_PATCH | Freq: Every day | TRANSDERMAL | 0 refills | Status: AC

## 2023-05-24 MED ORDER — COLCHICINE 0.3 MG HALF TABLET
0.3000 mg | ORAL_TABLET | Freq: Every day | ORAL | Status: DC
  Administered 2023-05-24: 0.3 mg via ORAL
  Filled 2023-05-24 (×2): qty 1

## 2023-05-24 NOTE — Group Note (Signed)
Recreation Therapy Group Note   Group Topic:Relaxation  Group Date: 05/24/2023 Start Time: 1000 End Time: 1050 Facilitators: Rosina Lowenstein, LRT, CTRS Location:  Craft Room  Group Description: PMR (Progressive Muscle Relaxation). LRT asks patients their current level of stress/anxiety from 1-10, with 10 being the highest. LRT educates patients on what PMR is and the benefits that come from it. Patients are asked to sit with their feet flat on the floor while sitting up and all the way back in their chair, if possible. LRT and pts follow a prompt through a speaker that requires you to tense and release different muscles in their body and focus on their breathing. During session, lights are off and soft music is being played. Pts are given a stress ball to use if needed. At the end of the prompt, LRT asks patients to rank their current levels of stress/anxiety from 1-10, 10 being the highest. LRT provides patients with an education handout on PMR.   Goal Area(s) Addressed:  Patients will be able to describe progressive muscle relaxation.  Patient will practice using relaxation technique. Patient will identify a new coping skill.  Patient will follow multistep directions to reduce anxiety and stress.   Affect/Mood: Appropriate   Participation Level: Minimal    Clinical Observations/Individualized Feedback: Aaron [name] came to group with 5 minutes remaining.   Plan: Continue to engage patient in RT group sessions 2-3x/week.   Rosina Lowenstein, LRT, CTRS 05/24/2023 11:34 AM

## 2023-05-24 NOTE — [facility] Suicide Risk Assessment (Signed)
Discharge Note:  Patient denies SI/HI/AVH at this time. Discharge instructions, AVS, prescriptions, and transition record gone over with patient. Patient agrees to comply with medication management, follow-up visit, and outpatient therapy. Patient belongings returned to patient. Patient questions and concerns addressed and answered. Patient ambulatory off unit. Patient discharged to home with cousin.

## 2023-05-24 NOTE — [facility] Suicide Risk Assessment (Signed)
[facility] INPATIENT:  Family/Significant Other Suicide Prevention Education  Suicide Prevention Education:  Family/Significant Other Refusal to Support Patient after Discharge:  Suicide Prevention Education Not Provided:  Patient has identified home of family/significant other as the place the patient will be residing after discharge.  With written consent of the patient, two attempts were made to provide Suicide Prevention Education to Aaron [name], mother, [phone].  This person indicates he/she will not be responsible for the patient after discharge.  Mother reports that she does not speak to patient "like that".  She reports that she was unaware that he was hospitalized.    [name] J Carleta Woodrow 05/24/2023,10:15 AM

## 2023-05-24 NOTE — Progress Notes (Signed)
  [facility] Adult Case Management Discharge Plan :  Will you be returning to the same living situation after discharge:  Yes,  pt is returning to group home. At discharge, do you have transportation home?: Yes,  Group home owner will provide transportation. Do you have the ability to pay for your medications: Yes,  [place] MEDICAID PREPAID HEALTH PLAN / [place] MEDICAID [facility]  Release of information consent forms completed and in the chart;  Patient's signature needed at discharge.  Patient to Follow up at:  Follow-up Information     Dr. Wyline Copas Follow up.   Why: Appointment is scheduled for 05/25/2023 at 8AM. Contact information: [facility] [address] [place], [place] [address]  [phone]                Next level of care provider has access to [facility] Link:no  Safety Planning and Suicide Prevention discussed: Yes,  SPE completed with the patient.      Has patient been referred to the Quitline?: Patient refused referral for treatment  Patient has been referred for addiction treatment: Patient refused referral for treatment.  Harden Mo, LCSW 05/24/2023, 10:30 AM

## 2023-05-24 NOTE — Discharge Summary (Signed)
Physician Discharge Summary Note  Patient:  Aaron [name] is an 47 y.o., male MRN:  [id number] DOB:  May 19, 1976 Patient phone:  [phone] (home)  Patient address:   [address] [place] [address],  Total Time spent with patient: 2.5 hours  Date of Admission:  05/21/2023 Date of Discharge: 05/24/2023  Reason for Admission:  47 year old African American male, reports feeling worthlessness, hopelessness, and despair. He has had suicidal ideations for the past month, and earlier today, he intended to walk into traffic to end his life but decided to come to the emergency department instead.During treatment team meeting patient reports mother's cancer diagnosis and his own chronic medical conditions as contributing stressors.patient reports self-medicating with daily marijuana use and also admits to using methamphetamine.The patient presents with severe depression, active suicidal ideation with plan and intent, and a history of two prior suicide attempts. His current suicidal thoughts are compounded by psychosocial stressors, including his mother's cancer and his own chronic medical conditions. Substance use complicates the clinical picture, but the patient denies hallucinations, delusions, or homicidal ideation.   Principal Problem: MDD (major depressive disorder), recurrent episode, severe (HCC) Discharge Diagnoses: Principal Problem:   MDD (major depressive disorder), recurrent episode, severe (HCC)   Past Psychiatric History: Depression Anxiety   Past Medical History:  Past Medical History:  Diagnosis Date   Asthma    Diabetes mellitus without complication (HCC)    Hypertension     Past Surgical History:  Procedure Laterality Date   COLONOSCOPY WITH PROPOFOL N/A 09/21/2022   Procedure: COLONOSCOPY WITH PROPOFOL;  Surgeon: Toney Reil, MD;  Location: [facility];  Service: Gastroenterology;  Laterality: N/A;   Family History: History reviewed. No pertinent  family history. Family Psychiatric  History: none reported Social History:  Social History   Substance and Sexual Activity  Alcohol Use Yes     Social History   Substance and Sexual Activity  Drug Use Yes   Types: Marijuana    Social History   Socioeconomic History   Marital status: Single    Spouse name: Not on file   Number of children: Not on file   Years of education: Not on file   Highest education level: Not on file  Occupational History   Not on file  Tobacco Use   Smoking status: Former    Current packs/day: 1.00    Average packs/day: 1 pack/day for 25.8 years (25.8 ttl pk-yrs)    Types: Cigarettes    Start date: 1999   Smokeless tobacco: Never   Tobacco comments:    Quit 10/07/22  Vaping Use   Vaping status: Never Used  Substance and Sexual Activity   Alcohol use: Yes   Drug use: Yes    Types: Marijuana   Sexual activity: Not Currently    Partners: Male    Birth control/protection: Condom    Comment: declined condoms  Other Topics Concern   Not on file  Social History Narrative   Not on file   Social Determinants of Health   Financial Resource Strain: Not on file  Food Insecurity: No Food Insecurity (05/22/2023)   Hunger Vital Sign    Worried About Running Out of Food in the Last Year: Never true    Ran Out of Food in the Last Year: Never true  Transportation Needs: No Transportation Needs (05/22/2023)   PRAPARE - Administrator, Civil Service (Medical): No    Lack of Transportation (Non-Medical): No  Physical Activity: Not on  file  Stress: Not on file  Social Connections: Not on file    Hospital Course:  47 year old African American male, was admitted following an evaluation in the emergency department due to worsening suicidal ideation (SI) and a plan to walk into traffic to end his life. His psychosocial stressors included his mother's cancer and his own chronic medical conditions, including HIV, epilepsy, diabetes, COPD, obesity,  and Tourette's syndrome. He reported a long-standing history of bipolar depression, with two prior suicide attempts via overdose (one of which required ventilation). At the time of admission, he was not taking prescribed antidepressants but self-medicated daily with cannabis and methamphetamines, confirmed by a positive urine drug screen (UDS).During his hospital stay, Mr. Moyse endorsed feelings of hopelessness, worthlessness, and despair. His mood remained depressed throughout the hospitalization, though he remained cooperative and engaged in his care. He denied any active hallucinations or delusions, though he expressed ongoing SI without immediate intent after the initial crisis. His thought process was coherent and relevant, and he did not respond to internal or external stimuli. Mr. Mcewan medical clearance prior to the psychiatric admission showed stable liver function tests and a complete blood count within normal limits. His potassium level was low at 3.2, requiring potassium replacement therapy.The psychiatric team initiated Lexapro 10 mg daily to address his depressive symptoms and Trazodone 50 mg nightly to help with sleep disturbances. His response to medications was monitored throughout his stay. Supportive counseling sessions were provided to assist him in managing his depressive symptoms and dealing with psychosocial stressors, including his mother's illness and his history of suicide attempts.Social work was heavily involved in his care due to his living situation and substance use. The patient worked and lived at [employer] a company owned by a friend, and reported that he used substances only during his off time. Substance use treatment services were offered, and arrangements were made to address his housing upon discharge.Throughout his hospitalization, Mr. Kalal remained stable, though he required ongoing support to manage his mood and chronic medical conditions. His  treatment was focused on stabilizing his mood, addressing his SI, and initiating long-term plans for outpatient care, including psychiatric follow-up, substance use treatment, and management of his medical conditions.By the time of discharge, Mr. Woodridge mood had stabilized somewhat, though he remained at risk due to his ongoing psychosocial stressors and substance use history. His safety plan included close follow-up with outpatient psychiatric services and supportive therapy to monitor for any recurrence of suicidal ideation.    Musculoskeletal: Strength & Muscle Tone: within normal limits Gait & Station: normal Patient leans: N/A   Psychiatric Specialty Exam:  Presentation  General Appearance:  Appropriate for Environment; Neat  Eye Contact: Good  Speech: Clear and Coherent  Speech Volume: Normal  Handedness: Right   Mood and Affect  Mood: Anxious (about discharge)  Affect: Appropriate   Thought Process  Thought Processes: Coherent  Descriptions of Associations:Intact  Orientation:Full (Time, Place and Person) (and situation)  Thought Content:WDL  History of Schizophrenia/Schizoaffective disorder:No  Duration of Psychotic Symptoms:none noted Hallucinations:Hallucinations: None  Ideas of Reference:None  Suicidal Thoughts:Suicidal Thoughts: No SI Active Intent and/or Plan: -- (none noted)  Homicidal Thoughts:Homicidal Thoughts: No   Sensorium  Memory: Immediate Good; Remote Good  Judgment: Good  Insight: Good   Executive Functions  Concentration: Good  Attention Span: Good  Recall: Good  Fund of Knowledge: Good  Language: Good   Psychomotor Activity  Psychomotor Activity: Psychomotor Activity: Normal   Assets  Assets:  Communication Skills; Financial Resources/Insurance; Housing; Social Support; Resilience; Desire for Improvement   Sleep  Sleep: Sleep: Good Number of Hours of Sleep: 8    Physical  Exam: Physical Exam Vitals and nursing note reviewed.  Constitutional:      Appearance: Normal appearance.  HENT:     Head: Normocephalic and atraumatic.     Nose: Nose normal.  Cardiovascular:     Pulses: Normal pulses.  Musculoskeletal:        General: Normal range of motion.     Cervical back: Normal range of motion.  Neurological:     General: No focal deficit present.     Mental Status: He is alert and oriented to person, place, and time.  Psychiatric:        Attention and Perception: Attention and perception normal.        Mood and Affect: Mood normal.        Speech: Speech normal.        Behavior: Behavior normal. Behavior is cooperative.        Thought Content: Thought content normal.        Cognition and Memory: Cognition and memory normal.        Judgment: Judgment normal.    ROS Blood pressure (!) 149/94, pulse 75, temperature 97.7 F (36.5 C), temperature source Oral, resp. rate 18, height 6' (1.829 m), weight 128.8 kg, SpO2 99%. Body mass index is 38.52 kg/m.   Social History   Tobacco Use  Smoking Status Former   Current packs/day: 1.00   Average packs/day: 1 pack/day for 25.8 years (25.8 ttl pk-yrs)   Types: Cigarettes   Start date: 1999  Smokeless Tobacco Never  Tobacco Comments   Quit 10/07/22   Tobacco Cessation:  A prescription for an FDA-approved tobacco cessation medication provided at discharge   Blood Alcohol level:  Lab Results  Component Value Date   ETH <10 05/21/2023   ETH <10 04/04/2020    Metabolic Disorder Labs:  Lab Results  Component Value Date   HGBA1C 6.5 (H) 09/23/2022   MPG 139.85 09/23/2022   MPG 139.85 02/15/2022   No results found for: "PROLACTIN" No results found for: "CHOL", "TRIG", "HDL", "CHOLHDL", "VLDL", "LDLCALC"  See Psychiatric Specialty Exam and Suicide Risk Assessment completed by Attending Physician prior to discharge.  Discharge destination:  [facility]  Is patient on multiple antipsychotic therapies at  discharge:  No   Has Patient had three or more failed trials of antipsychotic monotherapy by history:  No  Recommended Plan for Multiple Antipsychotic Therapies: NA   Allergies as of 05/24/2023       Reactions   Levaquin [levofloxacin] Itching, Rash        Medication List     TAKE these medications      Indication  albuterol 108 (90 Base) MCG/ACT inhaler Commonly known as: VENTOLIN HFA Inhale 2 puffs into the lungs every 6 (six) hours as needed for wheezing or shortness of breath.  Indication: Asthma   allopurinol 100 MG tablet Commonly known as: Zyloprim Take 1 tablet (100 mg total) by mouth daily.  Indication: Gout   atorvastatin 40 MG tablet Commonly known as: Lipitor Take 1 tablet (40 mg total) by mouth daily.  Indication: High Amount of Fats in the Blood   bictegravir-emtricitabine-tenofovir AF 50-200-25 MG Tabs tablet Commonly known as: BIKTARVY Take 1 tablet by mouth daily.  Indication: HIV Disease   colchicine 0.6 MG tablet Take 0.5 tablets (0.3 mg total) by mouth daily.  Indication: Gout   escitalopram 20 MG tablet Commonly known as: LEXAPRO Take 1 tablet (20 mg total) by mouth daily. Start taking on: May 25, 2023  Indication: Major Depressive Disorder   hydrOXYzine 25 MG tablet Commonly known as: ATARAX Take 1 tablet (25 mg total) by mouth every 8 (eight) hours as needed for anxiety.  Indication: Feeling Anxious   ipratropium-albuterol 0.5-2.5 (3) MG/3ML Soln Commonly known as: DUONEB Take 3 mLs by nebulization every 4 (four) hours as needed.  Indication: Chronic Obstructive Lung Disease   lamoTRIgine 25 MG tablet Commonly known as: LAMICTAL Take 1 tablet (25 mg total) by mouth 2 (two) times daily.  Indication: Depressive Phase of Manic-Depression   lisinopril-hydrochlorothiazide 20-25 MG tablet Commonly known as: ZESTORETIC Take by mouth.  Indication: High Blood Pressure   nicotine 14 mg/24hr patch Commonly known as: NICODERM CQ -  dosed in mg/24 hours Place 1 patch (14 mg total) onto the skin daily. Start taking on: May 25, 2023  Indication: Nicotine Addiction   traZODone 50 MG tablet Commonly known as: DESYREL Take 1 tablet (50 mg total) by mouth at bedtime.  Indication: Trouble Sleeping        Follow-up Information     Dr. Wyline Copas Follow up.   Why: Appointment is scheduled for 05/25/2023 at 8AM. Contact information: [facility] [address] [place], [place] [address]  [phone]                Follow-up recommendations:  Activity:  as tolerated Diet:  heart healthy Tests:  Lamtorgine level per PCP  Comments:   1..Lexapro 20 mg daily to address depressive symptoms. 2.Trazodone 50 mg nightly for sleep disturbances. 3. Lamictal 25 mg for mood stabilization 4.Monitor for side effects and efficacy of both medications, assessing mood, sleep, and functionality. 5.Engage the patient in supportive counseling to help manage depressive episodes and build emotional resilience. 6. Referral to substance use treatment services for methamphetamine and cannabis dependence. 7.Chronic Medical Care Coordination: Collaborate with primary care providers to address chronic health concerns, including HIV, diabetes, epilepsy, and COPD. 8.Folloup with PCP in 7 days of discharge 9. Assist with discharge planning and coordination of care and provide numbers for crisis centers. 10 Schedule follow-up appointments for psychiatric care and ongoing medication management. 11.Referral to outpatient therapy for continued emotional and substance use support.  Signed: Myriam Forehand, NP 05/24/2023, 12:47 PM

## 2023-05-24 NOTE — [facility] Suicide Risk Assessment (Signed)
[facility] Discharge Suicide Risk Assessment   Principal Problem: MDD (major depressive disorder), recurrent episode, severe (HCC) Discharge Diagnoses: Principal Problem:   MDD (major depressive disorder), recurrent episode, severe (HCC)   Total Time spent with patient: 1.5 hours  Musculoskeletal: Strength & Muscle Tone: within normal limits Gait & Station: normal Patient leans: N/A  Psychiatric Specialty Exam  Presentation  General Appearance:  Appropriate for Environment; Neat  Eye Contact: Good  Speech: Clear and Coherent  Speech Volume: Normal  Handedness: Right   Mood and Affect  Mood: Anxious (about discharge)  Duration of Depression Symptoms: Greater than two weeks  Affect: Appropriate   Thought Process  Thought Processes: Coherent  Descriptions of Associations:Intact  Orientation:Full (Time, Place and Person) (and situation)  Thought Content:WDL  History of Schizophrenia/Schizoaffective disorder:No  Duration of Psychotic Symptoms:No data recorded Hallucinations:Hallucinations: None  Ideas of Reference:None  Suicidal Thoughts:Suicidal Thoughts: No SI Active Intent and/or Plan: -- (none noted)  Homicidal Thoughts:Homicidal Thoughts: No   Sensorium  Memory: Immediate Good; Remote Good  Judgment: Good  Insight: Good   Executive Functions  Concentration: Good  Attention Span: Good  Recall: Good  Fund of Knowledge: Good  Language: Good   Psychomotor Activity  Psychomotor Activity: Psychomotor Activity: Normal   Assets  Assets: Communication Skills; Financial Resources/Insurance; Housing; Social Support; Resilience; Desire for Improvement   Sleep  Sleep: Sleep: Good Number of Hours of Sleep: 8   Physical Exam: Physical Exam Vitals and nursing note reviewed.  Constitutional:      Appearance: Normal appearance.  HENT:     Head: Normocephalic.     Nose: Nose normal.  Pulmonary:     Effort: Pulmonary effort  is normal.  Musculoskeletal:        General: Normal range of motion.     Cervical back: Normal range of motion.  Neurological:     General: No focal deficit present.     Mental Status: He is alert and oriented to person, place, and time. Mental status is at baseline.  Psychiatric:        Attention and Perception: Attention and perception normal.        Mood and Affect: Affect normal. Mood is anxious.        Speech: Speech normal.        Behavior: Behavior normal. Behavior is cooperative.        Thought Content: Thought content normal.        Cognition and Memory: Cognition and memory normal.        Judgment: Judgment normal.     Comments: About discharge    Review of Systems  All other systems reviewed and are negative.  Blood pressure (!) 149/94, pulse 75, temperature 97.7 F (36.5 C), temperature source Oral, resp. rate 18, height 6' (1.829 m), weight 128.8 kg, SpO2 99%. Body mass index is 38.52 kg/m.  Mental Status Per Nursing Assessment::   On Admission:  NA  Demographic Factors:  Male, Cardell Peach, lesbian, or bisexual orientation, and Low socioeconomic status  Loss Factors: Loss of significant relationship and Decline in physical health  Historical Factors: Impulsivity  Risk Reduction Factors:   Sense of responsibility to family, Living with another person, especially a relative, Positive social support, Positive therapeutic relationship, and Positive coping skills or problem solving skills  Continued Clinical Symptoms:  Bipolar Disorder:   Depressive phase Depression:   Hopelessness Impulsivity Recent sense of peace/wellbeing Alcohol/Substance Abuse/Dependencies Medical Diagnoses and Treatments/Surgeries  Cognitive Features That Contribute To Risk:  None  Suicide Risk:  Minimal: No identifiable suicidal ideation.  Patients presenting with no risk factors but with morbid ruminations; may be classified as minimal risk based on the severity of the depressive  symptoms   Follow-up Information     Dr. Wyline Copas Follow up.   Why: Appointment is scheduled for 05/25/2023 at 8AM. Contact information: [facility] [address] [place], [place] [address]  [phone]                Plan Of Care/Follow-up recommendations:  Activity:  as tolerated Diet:  heart healthy 1.Lexapro 20 mg daily to address depressive symptoms. 2.Trazodone 50 mg nightly for sleep disturbances. 3. Lamictal 25 mg for mood stabilization 4.Monitor for side effects and efficacy of both medications, assessing mood, sleep, and functionality. 5.Engage the patient in supportive counseling to help manage depressive episodes and build emotional resilience. 6. Referral to substance use treatment services for methamphetamine and cannabis dependence. 7.Chronic Medical Care Coordination: Collaborate with primary care providers to address chronic health concerns, including HIV, diabetes, epilepsy, and COPD. 8.Folloup with PCP in 7 days of discharge 9. Assist with discharge planning and coordination of care and provide numbers for crisis centers. 10 Schedule follow-up appointments for psychiatric care and ongoing medication management. 11.Referral to outpatient therapy for continued emotional and substance use support.        Myriam Forehand, NP 05/24/2023, 12:04 PM

## 2023-05-24 NOTE — Plan of Care (Signed)
  Problem: Education: Goal: Knowledge of General Education information will improve Description: Including pain rating scale, medication(s)/side effects and non-pharmacologic comfort measures Outcome: Progressing   Problem: Clinical Measurements: Goal: Will remain free from infection Outcome: Progressing   Problem: Coping: Goal: Level of anxiety will decrease Outcome: Progressing   Problem: Education: Goal: Emotional status will improve Outcome: Progressing   Problem: Coping: Goal: Ability to verbalize frustrations and anger appropriately will improve Outcome: Progressing

## 2023-05-24 NOTE — Group Note (Signed)
Date:  05/24/2023 Time:  12:21 PM  Group Topic/Focus:  OUTDOOR RECREATION MUSIC THERAPY    Participation Level:  Did Not Attend   Aaron [name] 05/24/2023, 12:21 PM

## 2023-05-24 NOTE — Plan of Care (Signed)
  Problem: Education: Goal: Knowledge of General Education information will improve Description: Including pain rating scale, medication(s)/side effects and non-pharmacologic comfort measures Outcome: Adequate for Discharge   Problem: Health Behavior/Discharge Planning: Goal: Ability to manage health-related needs will improve Outcome: Adequate for Discharge   Problem: Clinical Measurements: Goal: Ability to maintain clinical measurements within normal limits will improve Outcome: Adequate for Discharge Goal: Will remain free from infection Outcome: Adequate for Discharge Goal: Diagnostic test results will improve Outcome: Adequate for Discharge Goal: Respiratory complications will improve Outcome: Adequate for Discharge Goal: Cardiovascular complication will be avoided Outcome: Adequate for Discharge   Problem: Activity: Goal: Risk for activity intolerance will decrease Outcome: Adequate for Discharge   Problem: Nutrition: Goal: Adequate nutrition will be maintained Outcome: Adequate for Discharge   Problem: Coping: Goal: Level of anxiety will decrease Outcome: Adequate for Discharge   Problem: Elimination: Goal: Will not experience complications related to bowel motility Outcome: Adequate for Discharge Goal: Will not experience complications related to urinary retention Outcome: Adequate for Discharge   Problem: Pain Managment: Goal: General experience of comfort will improve Outcome: Adequate for Discharge   Problem: Safety: Goal: Ability to remain free from injury will improve Outcome: Adequate for Discharge   Problem: Skin Integrity: Goal: Risk for impaired skin integrity will decrease Outcome: Adequate for Discharge   Problem: Education: Goal: Knowledge of [facility] Education information/materials will improve Outcome: Adequate for Discharge Goal: Emotional status will improve Outcome: Adequate for Discharge Goal: Mental status will  improve Outcome: Adequate for Discharge Goal: Verbalization of understanding the information provided will improve Outcome: Adequate for Discharge   Problem: Activity: Goal: Interest or engagement in activities will improve Outcome: Adequate for Discharge Goal: Sleeping patterns will improve Outcome: Adequate for Discharge   Problem: Coping: Goal: Ability to verbalize frustrations and anger appropriately will improve Outcome: Adequate for Discharge Goal: Ability to demonstrate self-control will improve Outcome: Adequate for Discharge   Problem: Health Behavior/Discharge Planning: Goal: Identification of resources available to assist in meeting health care needs will improve Outcome: Adequate for Discharge Goal: Compliance with treatment plan for underlying cause of condition will improve Outcome: Adequate for Discharge   Problem: Physical Regulation: Goal: Ability to maintain clinical measurements within normal limits will improve Outcome: Adequate for Discharge   Problem: Safety: Goal: Periods of time without injury will increase Outcome: Adequate for Discharge   Problem: Education: Goal: Utilization of techniques to improve thought processes will improve Outcome: Adequate for Discharge Goal: Knowledge of the prescribed therapeutic regimen will improve Outcome: Adequate for Discharge   Problem: Activity: Goal: Interest or engagement in leisure activities will improve Outcome: Adequate for Discharge Goal: Imbalance in normal sleep/wake cycle will improve Outcome: Adequate for Discharge   Problem: Coping: Goal: Coping ability will improve Outcome: Adequate for Discharge Goal: Will verbalize feelings Outcome: Adequate for Discharge   Problem: Health Behavior/Discharge Planning: Goal: Ability to make decisions will improve Outcome: Adequate for Discharge Goal: Compliance with therapeutic regimen will improve Outcome: Adequate for Discharge   Problem: Role  Relationship: Goal: Will demonstrate positive changes in social behaviors and relationships Outcome: Adequate for Discharge   Problem: Safety: Goal: Ability to disclose and discuss suicidal ideas will improve Outcome: Adequate for Discharge Goal: Ability to identify and utilize support systems that promote safety will improve Outcome: Adequate for Discharge   Problem: Self-Concept: Goal: Will verbalize positive feelings about self Outcome: Adequate for Discharge Goal: Level of anxiety will decrease Outcome: Adequate for Discharge

## 2023-05-24 NOTE — Progress Notes (Signed)
Pleasant and cooperative with care. Denies SI, HI, AVH. Noted in dayroom watching tv with peers. Did not request sleep meds. Rested well. No complaints or concerns voiced. Encouragement and support provided. Safety checks maintained. Medications given as prescribed. Pt receptive and remains safe on unit with q 15 min checks.

## 2023-05-24 NOTE — Group Note (Signed)
Date:  05/24/2023 Time:  11:50 AM  Group Topic/Focus: Personal development  also known as self-improvement, is a lifelong process of activities that help people improve their skills, potential, and quality of life.     Participation Level:  Active  Participation Quality:  Appropriate  Affect:  Appropriate  Cognitive:  Alert and Oriented  Insight: Appropriate  Engagement in Group:  Developing/Improving and Engaged  Modes of Intervention:  Activity, Discussion, and Education  Additional Comments:    Rosaura Carpenter 05/24/2023, 11:50 AM

## 2023-06-14 DIAGNOSIS — F331 Major depressive disorder, recurrent, moderate: Secondary | ICD-10-CM | POA: Diagnosis not present

## 2023-06-14 DIAGNOSIS — F4310 Post-traumatic stress disorder, unspecified: Secondary | ICD-10-CM | POA: Diagnosis not present

## 2023-06-14 DIAGNOSIS — F1220 Cannabis dependence, uncomplicated: Secondary | ICD-10-CM | POA: Diagnosis not present

## 2023-06-14 DIAGNOSIS — F1520 Other stimulant dependence, uncomplicated: Secondary | ICD-10-CM | POA: Diagnosis not present

## 2023-06-20 DIAGNOSIS — F331 Major depressive disorder, recurrent, moderate: Secondary | ICD-10-CM | POA: Diagnosis not present

## 2023-06-20 DIAGNOSIS — F1220 Cannabis dependence, uncomplicated: Secondary | ICD-10-CM | POA: Diagnosis not present

## 2023-06-20 DIAGNOSIS — F1520 Other stimulant dependence, uncomplicated: Secondary | ICD-10-CM | POA: Diagnosis not present

## 2023-06-20 DIAGNOSIS — F4310 Post-traumatic stress disorder, unspecified: Secondary | ICD-10-CM | POA: Diagnosis not present

## 2023-06-22 DIAGNOSIS — F1520 Other stimulant dependence, uncomplicated: Secondary | ICD-10-CM | POA: Diagnosis not present

## 2023-06-22 DIAGNOSIS — F4310 Post-traumatic stress disorder, unspecified: Secondary | ICD-10-CM | POA: Diagnosis not present

## 2023-06-22 DIAGNOSIS — F331 Major depressive disorder, recurrent, moderate: Secondary | ICD-10-CM | POA: Diagnosis not present

## 2023-06-22 DIAGNOSIS — F1220 Cannabis dependence, uncomplicated: Secondary | ICD-10-CM | POA: Diagnosis not present

## 2023-07-04 DIAGNOSIS — F4310 Post-traumatic stress disorder, unspecified: Secondary | ICD-10-CM | POA: Diagnosis not present

## 2023-07-04 DIAGNOSIS — F331 Major depressive disorder, recurrent, moderate: Secondary | ICD-10-CM | POA: Diagnosis not present

## 2023-07-04 DIAGNOSIS — F1220 Cannabis dependence, uncomplicated: Secondary | ICD-10-CM | POA: Diagnosis not present

## 2023-07-18 DIAGNOSIS — F331 Major depressive disorder, recurrent, moderate: Secondary | ICD-10-CM | POA: Diagnosis not present

## 2023-07-18 DIAGNOSIS — F1220 Cannabis dependence, uncomplicated: Secondary | ICD-10-CM | POA: Diagnosis not present

## 2023-07-18 DIAGNOSIS — F4310 Post-traumatic stress disorder, unspecified: Secondary | ICD-10-CM | POA: Diagnosis not present

## 2023-07-20 DIAGNOSIS — F1220 Cannabis dependence, uncomplicated: Secondary | ICD-10-CM | POA: Diagnosis not present

## 2023-07-20 DIAGNOSIS — F331 Major depressive disorder, recurrent, moderate: Secondary | ICD-10-CM | POA: Diagnosis not present

## 2023-07-20 DIAGNOSIS — F4310 Post-traumatic stress disorder, unspecified: Secondary | ICD-10-CM | POA: Diagnosis not present

## 2023-07-25 DIAGNOSIS — F1220 Cannabis dependence, uncomplicated: Secondary | ICD-10-CM | POA: Diagnosis not present

## 2023-07-25 DIAGNOSIS — F331 Major depressive disorder, recurrent, moderate: Secondary | ICD-10-CM | POA: Diagnosis not present

## 2023-07-25 DIAGNOSIS — F4310 Post-traumatic stress disorder, unspecified: Secondary | ICD-10-CM | POA: Diagnosis not present

## 2023-08-01 DIAGNOSIS — F331 Major depressive disorder, recurrent, moderate: Secondary | ICD-10-CM | POA: Diagnosis not present

## 2023-08-01 DIAGNOSIS — F1220 Cannabis dependence, uncomplicated: Secondary | ICD-10-CM | POA: Diagnosis not present

## 2023-08-01 DIAGNOSIS — F4310 Post-traumatic stress disorder, unspecified: Secondary | ICD-10-CM | POA: Diagnosis not present

## 2023-08-15 DIAGNOSIS — B20 Human immunodeficiency virus [HIV] disease: Secondary | ICD-10-CM | POA: Diagnosis not present

## 2023-08-15 DIAGNOSIS — F4310 Post-traumatic stress disorder, unspecified: Secondary | ICD-10-CM | POA: Diagnosis not present

## 2023-08-15 DIAGNOSIS — F1220 Cannabis dependence, uncomplicated: Secondary | ICD-10-CM | POA: Diagnosis not present

## 2023-08-15 DIAGNOSIS — E1165 Type 2 diabetes mellitus with hyperglycemia: Secondary | ICD-10-CM | POA: Diagnosis not present

## 2023-08-15 DIAGNOSIS — F331 Major depressive disorder, recurrent, moderate: Secondary | ICD-10-CM | POA: Diagnosis not present

## 2023-08-17 DIAGNOSIS — F1220 Cannabis dependence, uncomplicated: Secondary | ICD-10-CM | POA: Diagnosis not present

## 2023-08-17 DIAGNOSIS — F4310 Post-traumatic stress disorder, unspecified: Secondary | ICD-10-CM | POA: Diagnosis not present

## 2023-08-17 DIAGNOSIS — F331 Major depressive disorder, recurrent, moderate: Secondary | ICD-10-CM | POA: Diagnosis not present

## 2023-08-22 DIAGNOSIS — F4310 Post-traumatic stress disorder, unspecified: Secondary | ICD-10-CM | POA: Diagnosis not present

## 2023-08-22 DIAGNOSIS — F331 Major depressive disorder, recurrent, moderate: Secondary | ICD-10-CM | POA: Diagnosis not present

## 2023-08-22 DIAGNOSIS — F1220 Cannabis dependence, uncomplicated: Secondary | ICD-10-CM | POA: Diagnosis not present

## 2023-08-24 DIAGNOSIS — F4310 Post-traumatic stress disorder, unspecified: Secondary | ICD-10-CM | POA: Diagnosis not present

## 2023-08-24 DIAGNOSIS — F331 Major depressive disorder, recurrent, moderate: Secondary | ICD-10-CM | POA: Diagnosis not present

## 2023-08-24 DIAGNOSIS — F1220 Cannabis dependence, uncomplicated: Secondary | ICD-10-CM | POA: Diagnosis not present

## 2023-08-25 DIAGNOSIS — F1220 Cannabis dependence, uncomplicated: Secondary | ICD-10-CM | POA: Diagnosis not present

## 2023-08-25 DIAGNOSIS — F331 Major depressive disorder, recurrent, moderate: Secondary | ICD-10-CM | POA: Diagnosis not present

## 2023-08-25 DIAGNOSIS — F4310 Post-traumatic stress disorder, unspecified: Secondary | ICD-10-CM | POA: Diagnosis not present

## 2023-08-28 DIAGNOSIS — F4310 Post-traumatic stress disorder, unspecified: Secondary | ICD-10-CM | POA: Diagnosis not present

## 2023-08-28 DIAGNOSIS — F1220 Cannabis dependence, uncomplicated: Secondary | ICD-10-CM | POA: Diagnosis not present

## 2023-08-28 DIAGNOSIS — F331 Major depressive disorder, recurrent, moderate: Secondary | ICD-10-CM | POA: Diagnosis not present

## 2023-09-06 DIAGNOSIS — F331 Major depressive disorder, recurrent, moderate: Secondary | ICD-10-CM | POA: Diagnosis not present

## 2023-09-06 DIAGNOSIS — F4310 Post-traumatic stress disorder, unspecified: Secondary | ICD-10-CM | POA: Diagnosis not present

## 2023-09-06 DIAGNOSIS — F1220 Cannabis dependence, uncomplicated: Secondary | ICD-10-CM | POA: Diagnosis not present

## 2023-09-08 DIAGNOSIS — F331 Major depressive disorder, recurrent, moderate: Secondary | ICD-10-CM | POA: Diagnosis not present

## 2023-09-08 DIAGNOSIS — F4310 Post-traumatic stress disorder, unspecified: Secondary | ICD-10-CM | POA: Diagnosis not present

## 2023-09-08 DIAGNOSIS — F1220 Cannabis dependence, uncomplicated: Secondary | ICD-10-CM | POA: Diagnosis not present

## 2023-10-19 ENCOUNTER — Emergency Department

## 2023-10-19 ENCOUNTER — Emergency Department
Admission: EM | Admit: 2023-10-19 | Discharge: 2023-10-19 | Disposition: A | Discharge status: Home / Self Care | Attending: Student in an Organized Health Care Education/Training Program | Admitting: Student in an Organized Health Care Education/Training Program

## 2023-10-19 ENCOUNTER — Other Ambulatory Visit: Payer: Self-pay

## 2023-10-19 DIAGNOSIS — M109 Gout, unspecified: Secondary | ICD-10-CM

## 2023-10-19 DIAGNOSIS — R0789 Other chest pain: Secondary | ICD-10-CM | POA: Diagnosis present

## 2023-10-19 DIAGNOSIS — J449 Chronic obstructive pulmonary disease, unspecified: Secondary | ICD-10-CM | POA: Insufficient documentation

## 2023-10-19 DIAGNOSIS — E119 Type 2 diabetes mellitus without complications: Secondary | ICD-10-CM | POA: Diagnosis not present

## 2023-10-19 DIAGNOSIS — Z21 Asymptomatic human immunodeficiency virus [HIV] infection status: Secondary | ICD-10-CM | POA: Insufficient documentation

## 2023-10-19 DIAGNOSIS — M79672 Pain in left foot: Secondary | ICD-10-CM | POA: Diagnosis not present

## 2023-10-19 DIAGNOSIS — I10 Essential (primary) hypertension: Secondary | ICD-10-CM | POA: Insufficient documentation

## 2023-10-19 HISTORY — DX: Chronic obstructive pulmonary disease, unspecified: J44.9

## 2023-10-19 HISTORY — DX: Unspecified convulsions: R56.9

## 2023-10-19 LAB — CBC WITH DIFFERENTIAL/PLATELET
Abs Immature Granulocytes: 0.03 10*3/uL (ref 0.00–0.07)
Basophils Absolute: 0.1 10*3/uL (ref 0.0–0.1)
Basophils Relative: 1 %
Eosinophils Absolute: 0.1 10*3/uL (ref 0.0–0.5)
Eosinophils Relative: 1 %
HCT: 41.6 % (ref 39.0–52.0)
Hemoglobin: 13.8 g/dL (ref 13.0–17.0)
Immature Granulocytes: 0 %
Lymphocytes Relative: 19 %
Lymphs Abs: 1.6 10*3/uL (ref 0.7–4.0)
MCH: 29.1 pg (ref 26.0–34.0)
MCHC: 33.2 g/dL (ref 30.0–36.0)
MCV: 87.6 fL (ref 80.0–100.0)
Monocytes Absolute: 1.1 10*3/uL — ABNORMAL HIGH (ref 0.1–1.0)
Monocytes Relative: 13 %
Neutro Abs: 5.6 10*3/uL (ref 1.7–7.7)
Neutrophils Relative %: 66 %
Platelets: 315 10*3/uL (ref 150–400)
RBC: 4.75 MIL/uL (ref 4.22–5.81)
RDW: 14.4 % (ref 11.5–15.5)
WBC: 8.5 10*3/uL (ref 4.0–10.5)
nRBC: 0 % (ref 0.0–0.2)

## 2023-10-19 LAB — COMPREHENSIVE METABOLIC PANEL
ALT: 16 U/L (ref 0–44)
AST: 17 U/L (ref 15–41)
Albumin: 4 g/dL (ref 3.5–5.0)
Alkaline Phosphatase: 36 U/L — ABNORMAL LOW (ref 38–126)
Anion gap: 8 (ref 5–15)
BUN: 15 mg/dL (ref 6–20)
CO2: 29 mmol/L (ref 22–32)
Calcium: 8.9 mg/dL (ref 8.9–10.3)
Chloride: 101 mmol/L (ref 98–111)
Creatinine, Ser: 1.28 mg/dL — ABNORMAL HIGH (ref 0.61–1.24)
GFR, Estimated: >60 mL/min (ref >60)
Glucose, Bld: 95 mg/dL (ref 70–99)
Potassium: 3.2 mmol/L — ABNORMAL LOW (ref 3.5–5.1)
Sodium: 138 mmol/L (ref 135–145)
Total Bilirubin: 0.8 mg/dL (ref 0.0–1.2)
Total Protein: 7.7 g/dL (ref 6.5–8.1)

## 2023-10-19 LAB — TROPONIN I (HIGH SENSITIVITY)
Troponin I (High Sensitivity): 9 ng/L (ref <18)
Troponin I (High Sensitivity): 9 ng/L (ref <18)

## 2023-10-19 LAB — D-DIMER, QUANTITATIVE: D-Dimer, Quant: <0.27 ug{FEU}/mL (ref 0.00–0.50)

## 2023-10-19 MED ORDER — OXYCODONE-ACETAMINOPHEN 5-325 MG PO TABS
1.0000 | ORAL_TABLET | ORAL | 0 refills | Status: AC | PRN
Start: 2023-10-19 — End: 2024-10-18

## 2023-10-19 MED ORDER — COLCHICINE 0.6 MG PO TABS
1.2000 mg | ORAL_TABLET | Freq: Once | ORAL | Status: DC
Start: 2023-10-19 — End: 2023-10-19

## 2023-10-19 MED ORDER — OXYCODONE-ACETAMINOPHEN 5-325 MG PO TABS
1.0000 | ORAL_TABLET | Freq: Once | ORAL | Status: AC
  Administered 2023-10-19: 1 via ORAL
  Filled 2023-10-19: qty 1

## 2023-10-19 NOTE — ED Notes (Signed)
 MD made aware BP 160/108.

## 2023-10-19 NOTE — ED Triage Notes (Signed)
 Pt to ED via [facility] from [facility]. Pt called out for lower back pain and dizziness. Pt states he has had indigestion for 3 weeks in his chest.  Pt has hx of COPD and HTN. HIV+. Pt states he is having chest pain at a 1 out of 10 at this time. Denies n/v at this time but states he had nausea this am.  BP 162/126 HR 85 RR 16 SPO2 98% RA  CBG 112  EMS gave: 324 ASA  1 spray nitroglycerin

## 2023-10-19 NOTE — ED Provider Notes (Signed)
 [facility] Provider Note    Event Date/Time   First MD Initiated Contact with Patient 10/19/23 1251     (approximate)   History   Chest Pain   HPI  Aaron [name] is a 48 y.o. male with a history of polysubstance abuse, MDD, HIV, asthma, diabetes presents to the ER for evaluation of multiple complaints including some chest discomfort as well as left lower extremity pain.  States the symptoms have been ongoing for several days to more than a week.     Physical Exam   Triage Vital Signs: ED Triage Vitals  Encounter Vitals Group     BP      Systolic BP Percentile      Diastolic BP Percentile      Pulse      Resp      Temp      Temp src      SpO2      Weight      Height      Head Circumference      Peak Flow      Pain Score      Pain Loc      Pain Education      Exclude from Growth Chart     Most recent vital signs: Vitals:   10/19/23 1256 10/19/23 1305  BP:  (!) 143/99  Pulse: 88 88  Resp: 20 20  Temp:  98.1 F (36.7 C)  SpO2: 98% 98%     Constitutional: Alert  Eyes: Conjunctivae are normal.  Head: Atraumatic. Nose: No congestion/rhinnorhea. Mouth/Throat: Mucous membranes are moist.   Neck: Painless ROM.  Cardiovascular:   Good peripheral circulation. Respiratory: Normal respiratory effort.  No retractions.  Gastrointestinal: Soft and nontender.  Musculoskeletal:  no deformity, no swelling.  Neurovascular intact distally.  There is some tenderness palpation of the left foot is generalized.  Sensation is intact. Neurologic:  MAE spontaneously. No gross focal neurologic deficits are appreciated.  Skin:  Skin is warm, dry and intact. No rash noted. Psychiatric: Mood and affect are normal. Speech and behavior are normal.    ED Results / Procedures / Treatments   Labs (all labs ordered are listed, but only abnormal results are displayed) Labs Reviewed  CBC WITH DIFFERENTIAL/PLATELET - Abnormal; Notable for the following  components:      Result Value   Monocytes Absolute 1.1 (*)    All other components within normal limits  COMPREHENSIVE METABOLIC PANEL - Abnormal; Notable for the following components:   Potassium 3.2 (*)    Creatinine, Ser 1.28 (*)    Alkaline Phosphatase 36 (*)    All other components within normal limits  D-DIMER, QUANTITATIVE (NOT AT [facility])  TROPONIN I (HIGH SENSITIVITY)  TROPONIN I (HIGH SENSITIVITY)     EKG  ED ECG REPORT I, Willy Eddy, the attending physician, personally viewed and interpreted this ECG.   Date: 10/19/2023  EKG Time: 12:59  Rate: 85  Rhythm: sinus  Axis: normal  Intervals: normal  ST&T Change:  no stemi, nonspecific st and t wave ab    RADIOLOGY Please see ED Course for my review and interpretation.  I personally reviewed all radiographic images ordered to evaluate for the above acute complaints and reviewed radiology reports and findings.  These findings were personally discussed with the patient.  Please see medical record for radiology report.    PROCEDURES:  Critical Care performed: No  Procedures   MEDICATIONS ORDERED IN ED: Medications -  No data to display   IMPRESSION / MDM / ASSESSMENT AND PLAN / ED COURSE  I reviewed the triage vital signs and the nursing notes.                              Differential diagnosis includes, but is not limited to, ACS, pericarditis, esophagitis, boerhaaves, pe, dissection, pna, bronchitis, costochondritis   Clinical Course as of 10/19/23 1520  Thu Oct 19, 2023  1357 Initial troponin is negative.  Have a lower suspicion for ACS.  Does not seem consistent with PE or dissection.  D-dimer negative.  No leukocytosis.  Will continue observe patient for repeat troponin. [PR]  1410 Chest x-ray on my review and interpretation without evidence of consolidation or pneumothorax. [PR]  1519 Patient complaining of left foot pain.  D-dimer is negative he is well-perfused.  Not consistent with ischemia.   Not consistent with DVT.  No sign of infection.  Will order x-ray to rule out fracture.  Patient be signed out oncoming physician pending follow-up troponin and imaging. [PR]    Clinical Course User Index [PR] Willy Eddy, MD     FINAL CLINICAL IMPRESSION(S) / ED DIAGNOSES   Final diagnoses:  Atypical chest pain  Acute pain of left foot     Rx / DC Orders   ED Discharge Orders     None        Note:  This document was prepared using Dragon voice recognition software and may include unintentional dictation errors.    Willy Eddy, MD 10/19/23 1520

## 2023-10-19 NOTE — ED Provider Notes (Signed)
-----------------------------------------   3:35 PM on 10/19/2023 -----------------------------------------  Blood pressure (!) 143/99, pulse 88, temperature 98.1 F (36.7 C), temperature source Oral, resp. rate 20, height 6' (1.829 m), weight 120.2 kg, SpO2 98%.  Assuming care from Dr. Roxan Hockey.  In short, Aaron [name] is a 48 y.o. male with a chief complaint of Chest Pain .  Refer to the original H&P for additional details.  The current plan of care is to follow-up repeat trop and imaging results.  ----------------------------------------- 6:35 PM on 10/19/2023 ----------------------------------------- Repeat troponin within normal limits, patient with no ongoing chest pain on reassessment.  He does complain of ongoing foot pain bilaterally, but primarily in his left foot around the left great toe.  X-ray shows no evidence of fracture or dislocation, does show findings consistent with gout, which patient reports a history of.  He was given a dose of pain medication with improvement and is appropriate for discharge home with outpatient follow-up with his PCP as well as rheumatology.  He was counseled to return to the ED for new or worsening symptoms.  Patient agrees with plan.       Chesley Noon, MD 10/19/23 [phone]

## 2023-10-23 ENCOUNTER — Emergency Department
Admission: EM | Admit: 2023-10-23 | Discharge: 2023-10-24 | Disposition: A | Discharge status: Home / Self Care | Attending: Emergency Medicine | Admitting: Emergency Medicine

## 2023-10-23 ENCOUNTER — Other Ambulatory Visit: Payer: Self-pay

## 2023-10-23 ENCOUNTER — Encounter: Payer: Self-pay | Admitting: Intensive Care

## 2023-10-23 DIAGNOSIS — R600 Localized edema: Secondary | ICD-10-CM | POA: Diagnosis not present

## 2023-10-23 DIAGNOSIS — I10 Essential (primary) hypertension: Secondary | ICD-10-CM | POA: Insufficient documentation

## 2023-10-23 DIAGNOSIS — M7122 Synovial cyst of popliteal space [Baker], left knee: Secondary | ICD-10-CM | POA: Diagnosis not present

## 2023-10-23 DIAGNOSIS — E119 Type 2 diabetes mellitus without complications: Secondary | ICD-10-CM | POA: Insufficient documentation

## 2023-10-23 DIAGNOSIS — J449 Chronic obstructive pulmonary disease, unspecified: Secondary | ICD-10-CM | POA: Insufficient documentation

## 2023-10-23 DIAGNOSIS — M7989 Other specified soft tissue disorders: Secondary | ICD-10-CM | POA: Diagnosis present

## 2023-10-23 DIAGNOSIS — Z21 Asymptomatic human immunodeficiency virus [HIV] infection status: Secondary | ICD-10-CM | POA: Diagnosis not present

## 2023-10-23 DIAGNOSIS — E876 Hypokalemia: Secondary | ICD-10-CM | POA: Diagnosis not present

## 2023-10-23 HISTORY — DX: Asymptomatic human immunodeficiency virus (hiv) infection status: Z21

## 2023-10-23 LAB — BASIC METABOLIC PANEL
Anion gap: 13 (ref 5–15)
BUN: 33 mg/dL — ABNORMAL HIGH (ref 6–20)
CO2: 25 mmol/L (ref 22–32)
Calcium: 9.1 mg/dL (ref 8.9–10.3)
Chloride: 98 mmol/L (ref 98–111)
Creatinine, Ser: 1.59 mg/dL — ABNORMAL HIGH (ref 0.61–1.24)
GFR, Estimated: 53 mL/min — ABNORMAL LOW (ref >60)
Glucose, Bld: 100 mg/dL — ABNORMAL HIGH (ref 70–99)
Potassium: 3 mmol/L — ABNORMAL LOW (ref 3.5–5.1)
Sodium: 136 mmol/L (ref 135–145)

## 2023-10-23 LAB — CBC WITH DIFFERENTIAL/PLATELET
Abs Immature Granulocytes: 0.03 10*3/uL (ref 0.00–0.07)
Basophils Absolute: 0.1 10*3/uL (ref 0.0–0.1)
Basophils Relative: 1 %
Eosinophils Absolute: 0.1 10*3/uL (ref 0.0–0.5)
Eosinophils Relative: 1 %
HCT: 38.6 % — ABNORMAL LOW (ref 39.0–52.0)
Hemoglobin: 12.8 g/dL — ABNORMAL LOW (ref 13.0–17.0)
Immature Granulocytes: 0 %
Lymphocytes Relative: 21 %
Lymphs Abs: 2 10*3/uL (ref 0.7–4.0)
MCH: 29 pg (ref 26.0–34.0)
MCHC: 33.2 g/dL (ref 30.0–36.0)
MCV: 87.5 fL (ref 80.0–100.0)
Monocytes Absolute: 1.4 10*3/uL — ABNORMAL HIGH (ref 0.1–1.0)
Monocytes Relative: 15 %
Neutro Abs: 6 10*3/uL (ref 1.7–7.7)
Neutrophils Relative %: 62 %
Platelets: 392 10*3/uL (ref 150–400)
RBC: 4.41 MIL/uL (ref 4.22–5.81)
RDW: 14.6 % (ref 11.5–15.5)
WBC: 9.6 10*3/uL (ref 4.0–10.5)
nRBC: 0 % (ref 0.0–0.2)

## 2023-10-23 LAB — D-DIMER, QUANTITATIVE: D-Dimer, Quant: 0.78 ug{FEU}/mL — ABNORMAL HIGH (ref 0.00–0.50)

## 2023-10-23 LAB — BRAIN NATRIURETIC PEPTIDE: B Natriuretic Peptide: 8.3 pg/mL (ref 0.0–100.0)

## 2023-10-23 NOTE — ED Triage Notes (Signed)
 First nurse note: Arrived by [facility] from hotel. C/o bilateral leg swelling. Seen for same Thursday.  EMS vitals: 10/10 pain 83HR 98% RA 163/95 b/p 83CBG  History HIV, hypertension and COPD

## 2023-10-23 NOTE — ED Triage Notes (Signed)
 Pt to ED via EMS, pt reports bilateral leg pain and swelling x1 week. Pt states he was seen here for same last Thursday. Pt denies any SOB. Pt denies injury to legs.

## 2023-10-24 ENCOUNTER — Emergency Department

## 2023-10-24 MED ORDER — FUROSEMIDE 20 MG PO TABS: 20.0000 mg | ORAL_TABLET | Freq: Every day | ORAL | 0 refills | Status: AC

## 2023-10-24 MED ORDER — POTASSIUM CHLORIDE CRYS ER 20 MEQ PO TBCR
40.0000 meq | EXTENDED_RELEASE_TABLET | Freq: Once | ORAL | Status: AC
  Administered 2023-10-24: 40 meq via ORAL
  Filled 2023-10-24: qty 2

## 2023-10-24 NOTE — ED Provider Notes (Signed)
 [facility] Provider Note    Event Date/Time   First MD Initiated Contact with Patient 10/23/23 2343     (approximate)   History   Leg Swelling   HPI  Aaron [name] is a 48 y.o. male   Past medical history of gout, COPD, diabetes and HIV, hypertension who presents to the emergency department with bilateral leg swelling and pain from the knee downwards. \   No trauma.  No joint swelling.  No redness or warmth or fever.  He had great toe pain that was treated for gout no longer hurts him.  He used to be on a diuretic but is no longer on a diuretic per his own report.  She has no chest pain or trouble breathing.  External Medical Documents Reviewed: Emergency department visit dated 10/19/2023 for toe pain treated for gout      Physical Exam   Triage Vital Signs: ED Triage Vitals  Encounter Vitals Group     BP 10/23/23 1924 112/76     Systolic BP Percentile --      Diastolic BP Percentile --      Pulse Rate 10/23/23 1924 85     Resp 10/23/23 1924 20     Temp 10/23/23 1924 97.8 F (36.6 C)     Temp Source 10/23/23 1924 Oral     SpO2 10/23/23 1924 100 %     Weight 10/23/23 1924 265 lb (120.2 kg)     Height 10/23/23 1924 6' (1.829 m)     Head Circumference --      Peak Flow --      Pain Score 10/23/23 1924 10     Pain Loc --      Pain Education --      Exclude from Growth Chart --     Most recent vital signs: Vitals:   10/24/23 0017 10/24/23 0250  BP: 128/80 (!) 131/97  Pulse: 83 79  Resp: 20 20  Temp: 98 F (36.7 C) 98.9 F (37.2 C)  SpO2: 100% 100%    General: Awake, no distress.  CV:  Good peripheral perfusion.  Resp:  Normal effort.  Abd:  No distention.  Other:  Bilateral lower extremity edema, equal in both limbs, neurovascular intact.  No warmth erythema or infectious changes in any other joints or skin.   ED Results / Procedures / Treatments   Labs (all labs ordered are listed, but only abnormal results are  displayed) Labs Reviewed  CBC WITH DIFFERENTIAL/PLATELET - Abnormal; Notable for the following components:      Result Value   Hemoglobin 12.8 (*)    HCT 38.6 (*)    Monocytes Absolute 1.4 (*)    All other components within normal limits  BASIC METABOLIC PANEL - Abnormal; Notable for the following components:   Potassium 3.0 (*)    Glucose, Bld 100 (*)    BUN 33 (*)    Creatinine, Ser 1.59 (*)    GFR, Estimated 53 (*)    All other components within normal limits  D-DIMER, QUANTITATIVE - Abnormal; Notable for the following components:   D-Dimer, Quant 0.78 (*)    All other components within normal limits  BRAIN NATRIURETIC PEPTIDE     I ordered and reviewed the above labs they are notable for mild hypokalemia 3.0.  D-dimer elevated 0.78     RADIOLOGY I independently reviewed and interpreted DVT ultrasound and see no obvious DVT I also reviewed radiologist's formal read.  PROCEDURES:  Critical Care performed: No  Procedures   MEDICATIONS ORDERED IN ED: Medications  potassium chloride SA (KLOR-CON M) CR tablet 40 mEq (40 mEq Oral Given 10/24/23 0253)    IMPRESSION / MDM / ASSESSMENT AND PLAN / ED COURSE  I reviewed the triage vital signs and the nursing notes.                                Patient's presentation is most consistent with acute presentation with potential threat to life or bodily function.  Differential diagnosis includes, but is not limited to, peripheral edema due to lymphedema, vascular insufficiency, CHF, DVT, considered but less likely infection like cellulitis or joint infections   MDM:    Bilateral leg swelling negative for DVT on ultrasound which was obtained due to high D-dimer.  No signs of ischemia to the legs.  No signs of infection to the skin or joints.  He says he used to be on a diuretic so I put him back on a low-dose of Lasix trial for only 1 week.  He has mildly low potassium so I gave some oral repletion and advised him to  follow a high potassium diet especially in light of new diuretic.  He will follow-up with his doctor after this trial course of diuretic to see if it helps his symptoms, also to recheck his labs.  Discharge.      FINAL CLINICAL IMPRESSION(S) / ED DIAGNOSES   Final diagnoses:  Peripheral edema  Baker cyst, left  Hypokalemia     Rx / DC Orders   ED Discharge Orders          Ordered    furosemide (LASIX) 20 MG tablet  Daily        10/24/23 0227             Note:  This document was prepared using Dragon voice recognition software and may include unintentional dictation errors.    Pilar Jarvis, MD 10/24/23 [phone]

## 2023-10-24 NOTE — ED Notes (Signed)
US tech at bedside for DVT study

## 2023-10-24 NOTE — Discharge Instructions (Addendum)
 Take acetaminophen 650 mg and ibuprofen 400 mg every 6 hours for pain.  Take with food.  For the swelling in your legs try taking Lasix which is a diuretic, for 7 days.  Then talk to your doctor about the results and see if you need ongoing treatment.  Your potassium was slightly low, see the attached documentation about potassium and how to increase potassium in your diet.  Have your doctor recheck this level.  Thank you for choosing [place] for your health care today!  Please see your primary doctor this week for a follow up appointment.   If you have any new, worsening, or unexpected symptoms call your doctor right away or come back to the emergency department for reevaluation.  It was my pleasure to care for you today.   Daneil Dan Modesto Charon, MD

## 2023-12-18 NOTE — Progress Notes (Signed)
 The ASCVD Risk score (Arnett DK, et al., 2019) failed to calculate for the following reasons:   Cannot find a previous HDL lab   Cannot find a previous total cholesterol lab  Arlon Bergamo, BSN, RN

## 2024-02-29 ENCOUNTER — Ambulatory Visit: Payer: 59 | Admitting: Internal Medicine

## 2024-03-13 ENCOUNTER — Ambulatory Visit: Payer: 59 | Admitting: Internal Medicine

## 2024-04-13 ENCOUNTER — Emergency Department
Admission: EM | Admit: 2024-04-13 | Discharge: 2024-04-13 | Disposition: A | Discharge status: Home / Self Care | Attending: Emergency Medicine | Admitting: Emergency Medicine

## 2024-04-13 ENCOUNTER — Other Ambulatory Visit: Payer: Self-pay

## 2024-04-13 DIAGNOSIS — E119 Type 2 diabetes mellitus without complications: Secondary | ICD-10-CM | POA: Diagnosis not present

## 2024-04-13 DIAGNOSIS — M545 Low back pain, unspecified: Secondary | ICD-10-CM | POA: Insufficient documentation

## 2024-04-13 DIAGNOSIS — I10 Essential (primary) hypertension: Secondary | ICD-10-CM | POA: Insufficient documentation

## 2024-04-13 MED ORDER — LIDOCAINE 5 % EX PTCH
1.0000 | MEDICATED_PATCH | CUTANEOUS | 0 refills | Status: AC
Start: 2024-04-13 — End: 2024-04-23

## 2024-04-13 MED ORDER — NAPROXEN 500 MG PO TABS
500.0000 mg | ORAL_TABLET | Freq: Once | ORAL | Status: AC
  Administered 2024-04-13: 500 mg via ORAL
  Filled 2024-04-13: qty 1

## 2024-04-13 MED ORDER — DIAZEPAM 5 MG PO TABS
10.0000 mg | ORAL_TABLET | Freq: Once | ORAL | Status: DC
  Filled 2024-04-13: qty 2

## 2024-04-13 MED ORDER — NAPROXEN 500 MG PO TABS
500.0000 mg | ORAL_TABLET | Freq: Two times a day (BID) | ORAL | 0 refills | Status: AC
Start: 2024-04-13 — End: 2024-04-20

## 2024-04-13 MED ORDER — CYCLOBENZAPRINE HCL 10 MG PO TABS: 10.0000 mg | ORAL_TABLET | Freq: Three times a day (TID) | ORAL | 0 refills | Status: AC | PRN

## 2024-04-13 MED ORDER — LIDOCAINE 5 % EX PTCH
1.0000 | MEDICATED_PATCH | Freq: Once | CUTANEOUS | Status: DC
  Administered 2024-04-13: 1 via TRANSDERMAL
  Filled 2024-04-13: qty 1

## 2024-04-13 NOTE — ED Triage Notes (Signed)
 Pt to ED via [facility] for c/o left-sided back pain x 4 days. Denies any injury. Rates pain 8/10. States rest makes pain better. Has taken ibuprofen with minimal relief. Last dose 4am today.

## 2024-04-13 NOTE — ED Notes (Signed)
 First nurse note:  To ED from home [facility] for dull lower L back pain, no trauma  Ambulatory on scene  VS 173/110, 98%, 83 pulse, 136 CBG  Hx HTN, HIV +, COPD, DM, asthma, epilepsy Allergic to Levaquin

## 2024-04-13 NOTE — Discharge Instructions (Signed)
 You were seen in the emergency department today for evaluation of your back pain. Fortunately, your exam here is reassuring.  I sent a prescription for an anti-inflammatory called naproxen  to your pharmacy.  Do not take this with other NSAIDs.  You can take Tylenol  with this. You can also try lidocaine  patches that are available over-the-counter or I sent a prescription for this.  We will send a prescription for a muscle relaxer to your pharmacy. Do not drive or operate machinery when taking this medication. Follow-up with your primary care provider within a few days for reevaluation. Return to the ER for any worsening symptoms including numbness, tingling, weakness, bowel or bladder incontinence, or any other new or concerning symptoms.

## 2024-04-13 NOTE — ED Provider Notes (Signed)
 [facility] Provider Note    Event Date/Time   First MD Initiated Contact with Patient 04/13/24 1110     (approximate)   History   Back Pain   HPI  Aaron [name] is a 48 year old male with history of HTN, T2DM presenting to the emergency department for evaluation of back pain.  Patient reports that a few days ago he had onset of left-sided back pain.  Nonradiating.  No recent trauma or heavy lifting.  No bowel or bladder symptoms.  Has had similar symptoms in the past that typically resolve on their own but presents today with his persistent symptoms.    Physical Exam   Triage Vital Signs: ED Triage Vitals  Encounter Vitals Group     BP 04/13/24 1047 (!) 157/106     Girls Systolic BP Percentile --      Girls Diastolic BP Percentile --      Boys Systolic BP Percentile --      Boys Diastolic BP Percentile --      Pulse Rate 04/13/24 1047 78     Resp 04/13/24 1047 16     Temp 04/13/24 1047 98.3 F (36.8 C)     Temp Source 04/13/24 1047 Oral     SpO2 04/13/24 1047 97 %     Weight 04/13/24 1045 260 lb (117.9 kg)     Height 04/13/24 1045 6' (1.829 m)     Head Circumference --      Peak Flow --      Pain Score 04/13/24 1036 5     Pain Loc --      Pain Education --      Exclude from Growth Chart --     Most recent vital signs: Vitals:   04/13/24 1047 04/13/24 1146  BP: (!) 157/106   Pulse: 78   Resp: 16   Temp: 98.3 F (36.8 C)   SpO2: 97% 97%     General: Awake, interactive  CV:  Regular rate, good peripheral perfusion.  Resp:  Unlabored respirations Abd:  Nondistended Back:   No tenderness to palpation of the midline spine.  Reproducible tenderness to palpation over the left paraspinous musculature.  5-5 strength of bilateral lower extremities with intact sensation. Neuro:  Symmetric facial movement, fluid speech   ED Results / Procedures / Treatments   Labs (all labs ordered are listed, but only abnormal results are  displayed) Labs Reviewed - No data to display   EKG EKG independently reviewed and interpreted by myself demonstrates:    RADIOLOGY Imaging independently reviewed and interpreted by myself demonstrates:   Formal Radiology Read:  No results found.  PROCEDURES:  Critical Care performed: No  Procedures   MEDICATIONS ORDERED IN ED: Medications  diazepam  (VALIUM ) tablet 10 mg (10 mg Oral Not Given 04/13/24 1146)  lidocaine  (LIDODERM ) 5 % 1 patch (1 patch Transdermal Patch Applied 04/13/24 1146)  naproxen  (NAPROSYN ) tablet 500 mg (500 mg Oral Given 04/13/24 1145)     IMPRESSION / MDM / ASSESSMENT AND PLAN / ED COURSE  I reviewed the triage vital signs and the nursing notes.  Differential diagnosis includes, but is not limited to, musculoskeletal strain, no bowel or bladder symptoms or neurologic deficits concerning for cauda equina or other acute spinal cord pathology, no focal bony tenderness or trauma suggestive of spinal fracture, no urinary symptoms or CVA tenderness suggestive of urinary pathology  Patient's presentation is most consistent with acute, uncomplicated illness.  48 year old male presenting to  the emergency department for evaluation of back pain.  Reassuring exam and history.  Treated with multimodal pain control.  Reports improved symptoms on reevaluation.  Will DC with prescription for naproxen , lidocaine  patches, Flexeril .  Strict return precautions provided.      FINAL CLINICAL IMPRESSION(S) / ED DIAGNOSES   Final diagnoses:  Acute left-sided low back pain without sciatica     Rx / DC Orders   ED Discharge Orders          Ordered    cyclobenzaprine  (FLEXERIL ) 10 MG tablet  3 times daily PRN        04/13/24 1214    naproxen  (NAPROSYN ) 500 MG tablet  2 times daily with meals        04/13/24 1214    lidocaine  (LIDODERM ) 5 %  Every 24 hours        04/13/24 1214             Note:  This document was prepared using Dragon voice recognition software  and may include unintentional dictation errors.   Levander Slate, MD 04/13/24 [phone]

## 2024-04-16 ENCOUNTER — Telehealth: Payer: Self-pay

## 2024-04-16 NOTE — Telephone Encounter (Signed)
 Not our patient

## 2024-04-18 ENCOUNTER — Other Ambulatory Visit: Payer: Self-pay | Admitting: Internal Medicine

## 2024-08-26 ENCOUNTER — Ambulatory Visit: Admitting: Internal Medicine
